# Patient Record
Sex: Female | Born: 1980 | Race: White | Hispanic: No | Marital: Single | Smoking: Former smoker
Health system: Southern US, Community
[De-identification: ages and names within clinical notes are randomized; demographics above are authoritative.]

## PROBLEM LIST (undated history)

## (undated) DIAGNOSIS — R112 Nausea with vomiting, unspecified: Secondary | ICD-10-CM

## (undated) DIAGNOSIS — N159 Renal tubulo-interstitial disease, unspecified: Secondary | ICD-10-CM

## (undated) DIAGNOSIS — F419 Anxiety disorder, unspecified: Secondary | ICD-10-CM

## (undated) DIAGNOSIS — M797 Fibromyalgia: Secondary | ICD-10-CM

## (undated) DIAGNOSIS — E162 Hypoglycemia, unspecified: Secondary | ICD-10-CM

## (undated) DIAGNOSIS — F1110 Opioid abuse, uncomplicated: Secondary | ICD-10-CM

## (undated) DIAGNOSIS — K56609 Unspecified intestinal obstruction, unspecified as to partial versus complete obstruction: Secondary | ICD-10-CM

## (undated) DIAGNOSIS — F4310 Post-traumatic stress disorder, unspecified: Secondary | ICD-10-CM

## (undated) DIAGNOSIS — K5909 Other constipation: Secondary | ICD-10-CM

## (undated) DIAGNOSIS — F32A Depression, unspecified: Secondary | ICD-10-CM

## (undated) DIAGNOSIS — F909 Attention-deficit hyperactivity disorder, unspecified type: Secondary | ICD-10-CM

## (undated) DIAGNOSIS — N390 Urinary tract infection, site not specified: Secondary | ICD-10-CM

## (undated) DIAGNOSIS — G8929 Other chronic pain: Secondary | ICD-10-CM

## (undated) DIAGNOSIS — F3160 Bipolar disorder, current episode mixed, unspecified: Secondary | ICD-10-CM

## (undated) DIAGNOSIS — Z9889 Other specified postprocedural states: Secondary | ICD-10-CM

## (undated) DIAGNOSIS — G43909 Migraine, unspecified, not intractable, without status migrainosus: Secondary | ICD-10-CM

## (undated) DIAGNOSIS — T4145XA Adverse effect of unspecified anesthetic, initial encounter: Secondary | ICD-10-CM

## (undated) DIAGNOSIS — M549 Dorsalgia, unspecified: Secondary | ICD-10-CM

## (undated) DIAGNOSIS — F329 Major depressive disorder, single episode, unspecified: Secondary | ICD-10-CM

## (undated) DIAGNOSIS — M546 Pain in thoracic spine: Secondary | ICD-10-CM

## (undated) DIAGNOSIS — B1920 Unspecified viral hepatitis C without hepatic coma: Secondary | ICD-10-CM

## (undated) DIAGNOSIS — F1990 Other psychoactive substance use, unspecified, uncomplicated: Secondary | ICD-10-CM

## (undated) DIAGNOSIS — M1990 Unspecified osteoarthritis, unspecified site: Secondary | ICD-10-CM

## (undated) HISTORY — PX: OVARY SURGERY: SHX727

## (undated) HISTORY — DX: Bipolar disorder, current episode mixed, unspecified: F31.60

## (undated) HISTORY — DX: Post-traumatic stress disorder, unspecified: F43.10

---

## 2003-07-06 ENCOUNTER — Emergency Department (HOSPITAL_COMMUNITY): Admission: EM | Admit: 2003-07-06 | Discharge: 2003-07-06 | Payer: Self-pay | Admitting: Emergency Medicine

## 2004-12-22 ENCOUNTER — Emergency Department (HOSPITAL_COMMUNITY): Admission: EM | Admit: 2004-12-22 | Discharge: 2004-12-23 | Payer: Self-pay | Admitting: Emergency Medicine

## 2005-08-23 DIAGNOSIS — K56609 Unspecified intestinal obstruction, unspecified as to partial versus complete obstruction: Secondary | ICD-10-CM

## 2005-08-23 DIAGNOSIS — T8859XA Other complications of anesthesia, initial encounter: Secondary | ICD-10-CM

## 2005-08-23 HISTORY — DX: Unspecified intestinal obstruction, unspecified as to partial versus complete obstruction: K56.609

## 2005-08-23 HISTORY — DX: Other complications of anesthesia, initial encounter: T88.59XA

## 2006-04-23 HISTORY — PX: BOWEL RESECTION: SHX1257

## 2011-12-28 ENCOUNTER — Observation Stay (HOSPITAL_COMMUNITY)
Admission: EM | Admit: 2011-12-28 | Discharge: 2012-01-01 | Disposition: A | Discharge status: Home / Self Care | Payer: Self-pay | Attending: Internal Medicine | Admitting: Internal Medicine

## 2011-12-28 ENCOUNTER — Observation Stay (HOSPITAL_COMMUNITY): Payer: Self-pay

## 2011-12-28 ENCOUNTER — Emergency Department (HOSPITAL_COMMUNITY): Payer: Self-pay

## 2011-12-28 ENCOUNTER — Encounter (HOSPITAL_COMMUNITY): Payer: Self-pay | Admitting: Emergency Medicine

## 2011-12-28 DIAGNOSIS — R1084 Generalized abdominal pain: Secondary | ICD-10-CM

## 2011-12-28 DIAGNOSIS — M545 Low back pain: Secondary | ICD-10-CM

## 2011-12-28 DIAGNOSIS — Z720 Tobacco use: Secondary | ICD-10-CM

## 2011-12-28 DIAGNOSIS — R109 Unspecified abdominal pain: Secondary | ICD-10-CM | POA: Diagnosis present

## 2011-12-28 DIAGNOSIS — R110 Nausea: Secondary | ICD-10-CM | POA: Insufficient documentation

## 2011-12-28 DIAGNOSIS — R10A1 Flank pain, right side: Secondary | ICD-10-CM

## 2011-12-28 DIAGNOSIS — F17200 Nicotine dependence, unspecified, uncomplicated: Secondary | ICD-10-CM | POA: Insufficient documentation

## 2011-12-28 DIAGNOSIS — J45909 Unspecified asthma, uncomplicated: Secondary | ICD-10-CM | POA: Diagnosis present

## 2011-12-28 DIAGNOSIS — R300 Dysuria: Secondary | ICD-10-CM | POA: Insufficient documentation

## 2011-12-28 DIAGNOSIS — K589 Irritable bowel syndrome without diarrhea: Secondary | ICD-10-CM

## 2011-12-28 DIAGNOSIS — M549 Dorsalgia, unspecified: Principal | ICD-10-CM

## 2011-12-28 DIAGNOSIS — R310 Gross hematuria: Secondary | ICD-10-CM | POA: Insufficient documentation

## 2011-12-28 DIAGNOSIS — G43909 Migraine, unspecified, not intractable, without status migrainosus: Secondary | ICD-10-CM

## 2011-12-28 DIAGNOSIS — Z23 Encounter for immunization: Secondary | ICD-10-CM | POA: Insufficient documentation

## 2011-12-28 DIAGNOSIS — K561 Intussusception: Secondary | ICD-10-CM

## 2011-12-28 DIAGNOSIS — R740 Nonspecific elevation of levels of transaminase and lactic acid dehydrogenase [LDH]: Secondary | ICD-10-CM

## 2011-12-28 DIAGNOSIS — R7402 Elevation of levels of lactic acid dehydrogenase (LDH): Secondary | ICD-10-CM | POA: Insufficient documentation

## 2011-12-28 DIAGNOSIS — R7401 Elevation of levels of liver transaminase levels: Secondary | ICD-10-CM

## 2011-12-28 HISTORY — DX: Pain in thoracic spine: M54.6

## 2011-12-28 HISTORY — DX: Anxiety disorder, unspecified: F41.9

## 2011-12-28 HISTORY — DX: Unspecified osteoarthritis, unspecified site: M19.90

## 2011-12-28 HISTORY — DX: Urinary tract infection, site not specified: N39.0

## 2011-12-28 HISTORY — DX: Hypoglycemia, unspecified: E16.2

## 2011-12-28 HISTORY — DX: Fibromyalgia: M79.7

## 2011-12-28 HISTORY — DX: Depression, unspecified: F32.A

## 2011-12-28 HISTORY — DX: Nausea with vomiting, unspecified: R11.2

## 2011-12-28 HISTORY — DX: Dorsalgia, unspecified: M54.9

## 2011-12-28 HISTORY — DX: Migraine, unspecified, not intractable, without status migrainosus: G43.909

## 2011-12-28 HISTORY — DX: Major depressive disorder, single episode, unspecified: F32.9

## 2011-12-28 HISTORY — DX: Renal tubulo-interstitial disease, unspecified: N15.9

## 2011-12-28 HISTORY — DX: Other chronic pain: G89.29

## 2011-12-28 HISTORY — DX: Attention-deficit hyperactivity disorder, unspecified type: F90.9

## 2011-12-28 HISTORY — DX: Other specified postprocedural states: Z98.890

## 2011-12-28 HISTORY — DX: Adverse effect of unspecified anesthetic, initial encounter: T41.45XA

## 2011-12-28 HISTORY — DX: Unspecified intestinal obstruction, unspecified as to partial versus complete obstruction: K56.609

## 2011-12-28 LAB — CBC
MCH: 31.5 pg (ref 26.0–34.0)
MCHC: 36.1 g/dL — ABNORMAL HIGH (ref 30.0–36.0)
Platelets: 281 10*3/uL (ref 150–400)
RBC: 4.89 MIL/uL (ref 3.87–5.11)
WBC: 10.7 10*3/uL — ABNORMAL HIGH (ref 4.0–10.5)

## 2011-12-28 LAB — RAPID URINE DRUG SCREEN, HOSP PERFORMED
Barbiturates: NOT DETECTED
Benzodiazepines: POSITIVE — AB
Cocaine: POSITIVE — AB
Opiates: NOT DETECTED
Tetrahydrocannabinol: POSITIVE — AB

## 2011-12-28 LAB — COMPREHENSIVE METABOLIC PANEL
Albumin: 4 g/dL (ref 3.5–5.2)
CO2: 17 mEq/L — ABNORMAL LOW (ref 19–32)
Chloride: 102 mEq/L (ref 96–112)
Creatinine, Ser: 0.77 mg/dL (ref 0.50–1.10)
GFR calc Af Amer: >90 mL/min (ref >90)
Total Protein: 7.3 g/dL (ref 6.0–8.3)

## 2011-12-28 LAB — URINALYSIS, ROUTINE W REFLEX MICROSCOPIC
Glucose, UA: NEGATIVE mg/dL
Ketones, ur: 15 mg/dL — AB
Protein, ur: NEGATIVE mg/dL
Urobilinogen, UA: 1 mg/dL (ref 0.0–1.0)
pH: 6 (ref 5.0–8.0)

## 2011-12-28 LAB — POCT PREGNANCY, URINE: Preg Test, Ur: NEGATIVE

## 2011-12-28 LAB — URINE MICROSCOPIC-ADD ON

## 2011-12-28 LAB — LACTIC ACID, PLASMA: Lactic Acid, Venous: 0.9 mmol/L (ref 0.5–2.2)

## 2011-12-28 LAB — LIPASE, BLOOD: Lipase: 38 U/L (ref 11–59)

## 2011-12-28 MED ORDER — HYDROMORPHONE HCL PF 1 MG/ML IJ SOLN
1.0000 mg | INTRAMUSCULAR | Status: DC | PRN
  Administered 2011-12-28 (×2): 1 mg via INTRAVENOUS
  Filled 2011-12-28 (×2): qty 1

## 2011-12-28 MED ORDER — SODIUM CHLORIDE 0.9 % IV BOLUS (SEPSIS)
1000.0000 mL | Freq: Once | INTRAVENOUS | Status: AC
  Administered 2011-12-28: 1000 mL via INTRAVENOUS

## 2011-12-28 MED ORDER — SODIUM CHLORIDE 0.9 % IV SOLN
INTRAVENOUS | Status: DC
  Administered 2011-12-28 – 2011-12-29 (×2): via INTRAVENOUS

## 2011-12-28 MED ORDER — ONDANSETRON HCL 4 MG PO TABS: 4.0000 mg | ORAL_TABLET | Freq: Four times a day (QID) | ORAL | Status: DC | PRN

## 2011-12-28 MED ORDER — ALBUTEROL SULFATE (5 MG/ML) 0.5% IN NEBU: 2.5000 mg | INHALATION_SOLUTION | RESPIRATORY_TRACT | Status: DC | PRN

## 2011-12-28 MED ORDER — HYDROMORPHONE HCL PF 1 MG/ML IJ SOLN
1.0000 mg | Freq: Once | INTRAMUSCULAR | Status: AC
  Administered 2011-12-28: 1 mg via INTRAVENOUS
  Filled 2011-12-28: qty 1

## 2011-12-28 MED ORDER — ONDANSETRON HCL 4 MG/2ML IJ SOLN: 4.0000 mg | Freq: Four times a day (QID) | INTRAMUSCULAR | Status: DC | PRN

## 2011-12-28 MED ORDER — ACETAMINOPHEN 325 MG PO TABS: 650.0000 mg | ORAL_TABLET | Freq: Four times a day (QID) | ORAL | Status: DC | PRN

## 2011-12-28 MED ORDER — LORAZEPAM 2 MG/ML IJ SOLN
0.5000 mg | Freq: Four times a day (QID) | INTRAMUSCULAR | Status: AC
Start: 2011-12-28 — End: 2011-12-29
  Administered 2011-12-28 – 2011-12-29 (×2): 0.5 mg via INTRAVENOUS
  Filled 2011-12-28 (×3): qty 1

## 2011-12-28 MED ORDER — ONDANSETRON HCL 4 MG/2ML IJ SOLN
4.0000 mg | Freq: Once | INTRAMUSCULAR | Status: AC
  Administered 2011-12-28: 4 mg via INTRAVENOUS
  Filled 2011-12-28 (×2): qty 2

## 2011-12-28 MED ORDER — OXYCODONE HCL 5 MG PO TABS
5.0000 mg | ORAL_TABLET | Freq: Four times a day (QID) | ORAL | Status: DC
  Administered 2011-12-28 – 2011-12-29 (×4): 5 mg via ORAL
  Filled 2011-12-28 (×5): qty 1

## 2011-12-28 MED ORDER — PNEUMOCOCCAL VAC POLYVALENT 25 MCG/0.5ML IJ INJ
0.5000 mL | INJECTION | INTRAMUSCULAR | Status: AC
  Administered 2011-12-29: 0.5 mL via INTRAMUSCULAR
  Filled 2011-12-28 (×2): qty 0.5

## 2011-12-28 MED ORDER — NICOTINE 14 MG/24HR TD PT24
14.0000 mg | MEDICATED_PATCH | Freq: Every day | TRANSDERMAL | Status: DC
  Administered 2011-12-28 – 2012-01-01 (×5): 14 mg via TRANSDERMAL
  Filled 2011-12-28 (×5): qty 1

## 2011-12-28 MED ORDER — ACETAMINOPHEN 650 MG RE SUPP: 650.0000 mg | Freq: Four times a day (QID) | RECTAL | Status: DC | PRN

## 2011-12-28 MED ORDER — HYDROMORPHONE HCL PF 1 MG/ML IJ SOLN
1.0000 mg | INTRAMUSCULAR | Status: DC | PRN
  Administered 2011-12-29 (×3): 1 mg via INTRAVENOUS
  Filled 2011-12-28 (×4): qty 1

## 2011-12-28 NOTE — ED Notes (Signed)
Nurse tech to take pt upstairs to [id number]

## 2011-12-28 NOTE — ED Notes (Signed)
Lower back pain x 2 days feels like knives when she voids hurts to move

## 2011-12-28 NOTE — ED Notes (Signed)
Provider notified that pt is awake and c/o pain again

## 2011-12-28 NOTE — ED Notes (Signed)
Attempted calling report, RN states she was busy, will try again w/in 15 min.

## 2011-12-28 NOTE — H&P (Signed)
PCP:   No primary provider on file.   Chief Complaint:  Right flank/RUQ abdominal pain, since 12/26/11.  HPI: This is a 31 year old female, with history of bronchial  Asthma, IBS, SBO 2007, s/p laparoscopy, laparoscopy 2001, for "ovarian problems", recently treated for UTI with Bactrim prescribed at [facility],  from 12/17/11-12/24/11, presenting with above symptoms. According to patient, following UTI treatment, she felt quite fine, went back to work on 12/26/11, and after work, at between 5:00 PM-6:00 PM, she developed severe right-sided back pain, constant, fluctuating in intensity, associated with fever and chills. She went back to [facility] on 12/27/11, had an abdominal/pelvic CT, which was reportedly negative, then discharged on Valium. She was unable to sleep all night, has continued to remain symptomatic and has vomited a few times since 4.55 AM today. By 5:00 AM, she decided to come to the ED.  Allergies:   Allergies  Allergen Reactions  . Levaquin (Levofloxacin)   . Morphine And Related   . Penicillins   . Toradol (Ketorolac Tromethamine)   . Ultram (Tramadol)       Past Medical History  Diagnosis Date  . Asthma     No past surgical history on file.  Prior to Admission medications   Not on File    Social History: Patient works at [employer], is single, has no offspring. She smokes about a pack of cigarettes per day, since age 42 years, used to drink heavily, up to 4 years ago, but now has only 2 beers on weekends. She does not have any smokeless tobacco history on file. Denies drug abuse.  No family history on file.  Review of Systems:  As per HPI and chief complaint. Patent denies fatigue, diminished appetite, weight loss, headache, blurred vision, difficulty in speaking, dysphagia, chest pain, cough, shortness of breath, orthopnea, paroxysmal nocturnal dyspnea, nausea, diaphoresis, belching, heartburn, hematemesis, melena, dysuria, nocturia, urinary  frequency, hematochezia, lower extremity swelling, pain, or redness. She has irritable bowel syndrome, and moves her bowels, about 5-6 times daily. The rest of the systems review is negative.  Physical Exam:  General: Alert, communicative, fully oriented, talking in complete sentences, not short of breath at rest. Appears to be in some pain on movement.  HEENT:  No clinical pallor, no jaundice, no conjunctival injection or discharge. Visible buccal mucosa appears "dry" NECK:  Supple, JVP not seen, no carotid bruits, no palpable lymphadenopathy, no palpable goiter. CHEST:  Clinically clear to auscultation, no wheezes, no crackles. HEART:  Sounds 1 and 2 heard, normal, regular, no murmurs. ABDOMEN:  Flat, soft, old surgical scars are noted, has mild RUQ tenderness, no palpable organomegaly, no palpable masses, normal bowel sounds. GENITALIA:  Not examined. LOWER EXTREMITIES:  No pitting edema, palpable peripheral pulses. MUSCULOSKELETAL SYSTEM:  Has tenderness to palpation of lumbar spine, with right paraspinal muscle spasm.  CENTRAL NERVOUS SYSTEM:  No focal neurologic deficit on gross examination.  Labs on Admission:  Results for orders placed during the hospital encounter of 12/28/11 (from the past 48 hour(s))  URINALYSIS, ROUTINE W REFLEX MICROSCOPIC     Status: Abnormal   Collection Time   12/28/11  7:38 AM      Component Value Range Comment   Color, Urine AMBER (*) YELLOW  BIOCHEMICALS MAY BE AFFECTED BY COLOR   APPearance TURBID (*) CLEAR     Specific Gravity, Urine 1.023  1.005 - 1.030     pH 6.0  5.0 - 8.0     Glucose, UA NEGATIVE  NEGATIVE (mg/dL)    Hgb urine dipstick LARGE (*) NEGATIVE     Bilirubin Urine NEGATIVE  NEGATIVE     Ketones, ur 15 (*) NEGATIVE (mg/dL)    Protein, ur NEGATIVE  NEGATIVE (mg/dL)    Urobilinogen, UA 1.0  0.0 - 1.0 (mg/dL)    Nitrite NEGATIVE  NEGATIVE     Leukocytes, UA SMALL (*) NEGATIVE    URINE MICROSCOPIC-ADD ON     Status: Abnormal   Collection  Time   12/28/11  7:38 AM      Component Value Range Comment   Squamous Epithelial / LPF MANY (*) RARE     WBC, UA 7-10  <3 (WBC/hpf)    RBC / HPF 11-20  <3 (RBC/hpf)    Bacteria, UA FEW (*) RARE     Urine-Other MUCOUS PRESENT     POCT PREGNANCY, URINE     Status: Normal   Collection Time   12/28/11  7:46 AM      Component Value Range Comment   Preg Test, Ur NEGATIVE  NEGATIVE    CBC     Status: Abnormal   Collection Time   12/28/11  7:56 AM      Component Value Range Comment   WBC 10.7 (*) 4.0 - 10.5 (K/uL)    RBC 4.89  3.87 - 5.11 (MIL/uL)    Hemoglobin 15.4 (*) 12.0 - 15.0 (g/dL)    HCT 29.5  62.1 - 30.8 (%)    MCV 87.3  78.0 - 100.0 (fL)    MCH 31.5  26.0 - 34.0 (pg)    MCHC 36.1 (*) 30.0 - 36.0 (g/dL)    RDW 65.7  84.6 - 96.2 (%)    Platelets 281  150 - 400 (K/uL) PLATELET COUNT CONFIRMED BY SMEAR  COMPREHENSIVE METABOLIC PANEL     Status: Abnormal   Collection Time   12/28/11  7:56 AM      Component Value Range Comment   Sodium 133 (*) 135 - 145 (mEq/L)    Potassium 3.6  3.5 - 5.1 (mEq/L)    Chloride 102  96 - 112 (mEq/L)    CO2 17 (*) 19 - 32 (mEq/L)    Glucose, Bld 78  70 - 99 (mg/dL)    BUN 11  6 - 23 (mg/dL)    Creatinine, Ser 9.52  0.50 - 1.10 (mg/dL)    Calcium 9.5  8.4 - 10.5 (mg/dL)    Total Protein 7.3  6.0 - 8.3 (g/dL)    Albumin 4.0  3.5 - 5.2 (g/dL)    AST 841 (*) 0 - 37 (U/L)    ALT 222 (*) 0 - 35 (U/L)    Alkaline Phosphatase 77  39 - 117 (U/L)    Total Bilirubin 0.8  0.3 - 1.2 (mg/dL)    GFR calc non Af Amer >90  >90 (mL/min)    GFR calc Af Amer >90  >90 (mL/min)   LIPASE, BLOOD     Status: Normal   Collection Time   12/28/11  8:04 AM      Component Value Range Comment   Lipase 38  11 - 59 (U/L)   LACTIC ACID, PLASMA     Status: Normal   Collection Time   12/28/11 11:30 AM      Component Value Range Comment   Lactic Acid, Venous 0.7  0.5 - 2.2 (mmol/L)     Radiological Exams on Admission: *RADIOLOGY REPORT*  Clinical Data: Abdominal pain question  biliary colic,  transaminitis,  question cholecystitis  ULTRASOUND ABDOMEN:  Technique: Sonography of upper abdominal structures was performed.  Comparison: 02/02/2007  Correlation: CT abdomen 12/27/2011  Gallbladder: Normally distended without stones or wall thickening.  No pericholecystic fluid or sonographic Murphy sign.  Common bile duct: Normal caliber 5 mm diameter  Liver: Normal appearance  IVC: Normal appearance  Pancreas: Normal appearance  Spleen: Normal appearance, 7.9 cm length  Right kidney: 11.2 cm length. Normal morphology without mass or  hydronephrosis.  Left kidney: 11.0 cm length. Normal morphology without mass or  hydronephrosis.  Aorta: Normal caliber  Other: No free fluid  IMPRESSION:  Normal exam.  Original Report Authenticated By: Lollie Marrow, M.D.   Assessment/Plan Principal Problem:  *Acute back pain: Patient has clearly discernible right flank/back pain, in a radicular distribution, associated with right paraspinal muscle spasm, and lumber tenderness, exacerbated by movement. She has no history of antecedent trauma. Given her presentation with recent UTI, chills and fever, an epidural abscess/diskitis is in the differential, or simply a disk prolapse with radiculopathy. Urinalysis is negative, effectively ruling out a right pyelonephritis, and abdominal U/S is negative for calculi or hydronephrosis. We shall manage with opioid analgesics, and arrange thoraco-lumbar MRI. If patient spikes a pyrexia, we shall commence broad spectrum antibiotics. Meanwhile, we shall send off blood cultures. Active Problems:  1. Abdominal pain: Patient has mild RUQ abdominal pain, which I suspect is referred from back pain. Although she has a mild transaminitis, this is insufficient to account for pain of this severity.  2. Transaminitis: Etiology is unclear. Patient's alcohol intake is only mild, and she has no previous documented transaminitis. We shall do urine drug screen,  Acetaminophen levels, and viral hepatitis screen. Liver appearance is normal on ultrasound.  3. Asthma: Stable/Asymptomatic.  4. IBS (irritable bowel syndrome): Appears stable. In view of recent antibiotic treatment, will screen for C. Diff.   5. Tobacco abuse: Patient smokes about a pack of cigarettes per day. She has been counseled appropriately. We shall commence Nicoderm CQ patch.  Further management will depend on clinical course.  Time Spent on Admission: 45 mins.  Kvion Shapley,CHRISTOPHER 12/28/2011, 2:28 PM

## 2011-12-28 NOTE — ED Notes (Signed)
Pt asleep and resting comfortably. 

## 2011-12-28 NOTE — ED Provider Notes (Signed)
History     CSN: [id number]  Arrival date & time 12/28/11  4098   First MD Initiated Contact with Patient 12/28/11 [phone]      Chief Complaint  Patient presents with  . Back Pain    (Consider location/radiation/quality/duration/timing/severity/associated sxs/prior treatment) Patient is a 31 y.o. female presenting with back pain. The history is provided by the patient and a relative.  Back Pain  This is a new problem.   right flank pain for the last several days. No known back injury. Pain described as burning and sharp, radiating to the right side/abd, severe. Assoc with chills, nausea, dysuria, hematuria (though pt is on menses now). No known fever. Denies CP, SOB, change in bowel habits, trouble ambulating, incontinence. Movement and palpation of the affected area make her pain worse. Nothing makes it better. Seen for same at [facility] ER last night, with normal labs and CT scan per pt. Tx approx 1 week ago by [facility] for UTI with bactrim and percocet.      Past Medical History  Diagnosis Date  . Asthma     Surgical hx: procedure for SBO, "fluid drainage from around ovaries"  No family history on file.  History  Substance Use Topics  . Smoking status: Yes  . Smokeless tobacco:   . Alcohol Use: Socially    Review of Systems  Musculoskeletal: Positive for back pain.  10 systems reviewed and are otherwise negative for acute change except as noted in the HPI.   Allergies  Levaquin; Morphine and related; Penicillins; Toradol; and Ultram  Home Medications  No current outpatient prescriptions on file.  BP 122/81  Pulse 82  Temp(Src) 97.9 F (36.6 C) (Oral)  Resp 22  SpO2 97%  Physical Exam  Constitutional: She is oriented to person, place, and time. She appears well-developed and well-nourished. She appears distressed.       Vital signs are reviewed and are normal.   HENT:  Head: Normocephalic and atraumatic.  Mouth/Throat: Oropharynx is clear and moist.    Eyes: Conjunctivae are normal. Pupils are equal, round, and reactive to light. No scleral icterus.  Neck: Normal range of motion. Neck supple.  Cardiovascular: Normal rate, regular rhythm, normal heart sounds and intact distal pulses.   Pulmonary/Chest: Effort normal and breath sounds normal. No respiratory distress. She has no wheezes. She exhibits no tenderness.  Abdominal: Soft. Bowel sounds are normal. She exhibits no distension. There is generalized tenderness (worst to entire right side of abd). There is CVA tenderness (right). There is no rebound and no guarding.  Musculoskeletal: Normal range of motion. She exhibits tenderness. She exhibits no edema.       Back:  Neurological: She is alert and oriented to person, place, and time.  Skin: Skin is warm and dry. There is erythema.       ED Course  Procedures (including critical care time)  Labs Reviewed  URINALYSIS, ROUTINE W REFLEX MICROSCOPIC - Abnormal; Notable for the following:    Color, Urine AMBER (*) BIOCHEMICALS MAY BE AFFECTED BY COLOR   APPearance TURBID (*)    Hgb urine dipstick LARGE (*)    Ketones, ur 15 (*)    Leukocytes, UA SMALL (*)    All other components within normal limits  CBC - Abnormal; Notable for the following:    Hemoglobin 15.4 (*)    MCHC 36.1 (*)    All other components within normal limits  URINE MICROSCOPIC-ADD ON - Abnormal; Notable for the following:  Squamous Epithelial / LPF MANY (*)    Bacteria, UA FEW (*)    All other components within normal limits  POCT PREGNANCY, URINE  COMPREHENSIVE METABOLIC PANEL   US Abdomen Complete  12/28/2011  *RADIOLOGY REPORT*  Clinical Data:  Abdominal pain question biliary colic, transaminitis, question cholecystitis  ULTRASOUND ABDOMEN:  Technique:  Sonography of upper abdominal structures was performed.  Comparison:  02/02/2007 Correlation:  CT abdomen 12/27/2011  Gallbladder:  Normally distended without stones or wall thickening. No pericholecystic  fluid or sonographic Murphy sign.  Common bile duct:  Normal caliber 5 mm diameter  Liver:  Normal appearance  IVC:  Normal appearance  Pancreas:  Normal appearance  Spleen:  Normal appearance, 7.9 cm length  Right kidney:  11.2 cm length. Normal morphology without mass or hydronephrosis.  Left kidney:  11.0 cm length. Normal morphology without mass or hydronephrosis.  Aorta:  Normal caliber  Other:  No free fluid  IMPRESSION: Normal exam.  Original Report Authenticated By: Lollie Marrow, M.D.     1. Right flank pain   2. Transaminitis       MDM  8:00 AM- On initial examination, pt writing around in bed, hyperventilating. Rash seen to right side that she reports was absent prior to placement of a heating pad on the way to ED. Not c/w shingles in appearance. No vesicles seen. Given recent UTI, suspicion present for pyelonephritis. Basic labs, pain med, nausea med ordered. Have requested records from [place] ED from yesterday's visit.    8:55 AM Transaminitis. Anion Gap 14. Pt denies hx hepatic dz. Lipase is added to labs. Awaiting results from [place], but will likely need to order abd [place] to eval for biliary pathology.    10:10 AM Results from [place] reviewed, CT scan results reviewed. Only pertinent finding is subcentimeter hypodensity in left lobe of liver, suspected to be benign. Labs sig for transaminitis, lower than that seen on today's labs. Lipase WNL today. [place] abd ordered. Pt has required additional pain medication dosing.   11:45 AM Abd [place] normal. No explanation for pt pain/transaminitis has been found. She denies excessive acetaminophen or ASA use. Denies heavy drinking. No hx DM. Will page unassigned service for admission to hospital to further eval the cause of her symptoms.     12:30 PM Dr Brien Few, [facility], has agreed to see pt in ED for admission.   Shaaron Adler, PA-C 12/28/11 1259

## 2011-12-28 NOTE — ED Notes (Signed)
Attempted calling report again, was told RN was at lunch and Charge nurse would call back w/in 5 min to get report

## 2011-12-28 NOTE — Progress Notes (Signed)
12/28/2011 Amy Walls [facility] Case Management Note [phone]  Utilization review completed.  

## 2011-12-29 ENCOUNTER — Observation Stay (HOSPITAL_COMMUNITY): Payer: Self-pay

## 2011-12-29 DIAGNOSIS — R1084 Generalized abdominal pain: Secondary | ICD-10-CM

## 2011-12-29 DIAGNOSIS — F17200 Nicotine dependence, unspecified, uncomplicated: Secondary | ICD-10-CM

## 2011-12-29 DIAGNOSIS — M545 Low back pain, unspecified: Secondary | ICD-10-CM

## 2011-12-29 DIAGNOSIS — R7401 Elevation of levels of liver transaminase levels: Secondary | ICD-10-CM

## 2011-12-29 DIAGNOSIS — R740 Nonspecific elevation of levels of transaminase and lactic acid dehydrogenase [LDH]: Secondary | ICD-10-CM

## 2011-12-29 LAB — CBC
HCT: 36.9 % (ref 36.0–46.0)
Hemoglobin: 12.8 g/dL (ref 12.0–15.0)
MCH: 30.7 pg (ref 26.0–34.0)
MCHC: 34.7 g/dL (ref 30.0–36.0)
RBC: 4.17 MIL/uL (ref 3.87–5.11)

## 2011-12-29 LAB — COMPREHENSIVE METABOLIC PANEL
ALT: 287 U/L — ABNORMAL HIGH (ref 0–35)
AST: 332 U/L — ABNORMAL HIGH (ref 0–37)
Calcium: 8.7 mg/dL (ref 8.4–10.5)
Creatinine, Ser: 0.74 mg/dL (ref 0.50–1.10)
GFR calc Af Amer: >90 mL/min (ref >90)
GFR calc non Af Amer: >90 mL/min (ref >90)
Sodium: 138 mEq/L (ref 135–145)
Total Protein: 5.8 g/dL — ABNORMAL LOW (ref 6.0–8.3)

## 2011-12-29 LAB — HEPATITIS PANEL, ACUTE: Hepatitis B Surface Ag: NEGATIVE

## 2011-12-29 MED ORDER — METHOCARBAMOL 500 MG PO TABS
500.0000 mg | ORAL_TABLET | Freq: Four times a day (QID) | ORAL | Status: DC
  Administered 2011-12-29 – 2012-01-01 (×13): 500 mg via ORAL
  Filled 2011-12-29 (×18): qty 1

## 2011-12-29 MED ORDER — HYDROMORPHONE HCL PF 1 MG/ML IJ SOLN
1.0000 mg | Freq: Four times a day (QID) | INTRAMUSCULAR | Status: DC | PRN
  Administered 2011-12-29 – 2012-01-01 (×12): 1 mg via INTRAVENOUS
  Filled 2011-12-29 (×11): qty 1

## 2011-12-29 MED ORDER — OXYCODONE HCL 5 MG PO TABS
10.0000 mg | ORAL_TABLET | Freq: Four times a day (QID) | ORAL | Status: DC
  Administered 2011-12-29 – 2012-01-01 (×12): 10 mg via ORAL
  Filled 2011-12-29: qty 1
  Filled 2011-12-29 (×3): qty 2
  Filled 2011-12-29: qty 1
  Filled 2011-12-29 (×8): qty 2

## 2011-12-29 MED ORDER — NITROFURANTOIN MONOHYD MACRO 100 MG PO CAPS
100.0000 mg | ORAL_CAPSULE | Freq: Two times a day (BID) | ORAL | Status: AC
  Administered 2011-12-29 – 2011-12-31 (×6): 100 mg via ORAL
  Filled 2011-12-29 (×6): qty 1

## 2011-12-29 MED ORDER — ONDANSETRON HCL 4 MG/2ML IJ SOLN: 4.0000 mg | Freq: Three times a day (TID) | INTRAMUSCULAR | Status: DC | PRN

## 2011-12-29 MED ORDER — GADOBENATE DIMEGLUMINE 529 MG/ML IV SOLN
11.0000 mL | Freq: Once | INTRAVENOUS | Status: AC | PRN
  Administered 2011-12-29: 11 mL via INTRAVENOUS

## 2011-12-29 MED ORDER — HYDROMORPHONE HCL PF 1 MG/ML IJ SOLN: 1.0000 mg | INTRAMUSCULAR | Status: DC | PRN

## 2011-12-29 MED ORDER — SODIUM CHLORIDE 0.9 % IV SOLN: INTRAVENOUS | Status: DC
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
## 2011-12-29 NOTE — ED Provider Notes (Signed)
Medical screening examination/treatment/procedure(s) were performed by non-physician practitioner and as supervising physician I was immediately available for consultation/collaboration.    Nelia Shi, MD 12/29/11 [phone]

## 2011-12-29 NOTE — Progress Notes (Signed)
Patient displaying severe amount of ride sided flank pain this shift, jumping out of the bed and screaming in pain, even with Oxycodone IR and Dilaudid administered earlier.  Questioned patient regarding current or past drug abuse of any kind.  Patient denies abuse of any kind.  Patient states that earlier in the year she had brief suicidal ideations when she lost her job and her dog died, but when questioned about current suicidal ideations, patient states "Everything is fine now, life is good."  Patient has heating pad brought in from home stating that "it is the only thing that helps my pain," which she has been told multiple times that there needs to be a MD order for such use.  Skin appeared to be red and slightly irritated from heating pad application to direct skin, offered to wrap pad in towel, patient refused this measure, continues to use heating pad against instruction.

## 2011-12-30 ENCOUNTER — Encounter (HOSPITAL_COMMUNITY): Payer: Self-pay | Admitting: Radiology

## 2011-12-30 ENCOUNTER — Observation Stay (HOSPITAL_COMMUNITY): Payer: Self-pay

## 2011-12-30 DIAGNOSIS — M545 Low back pain: Secondary | ICD-10-CM

## 2011-12-30 DIAGNOSIS — R109 Unspecified abdominal pain: Secondary | ICD-10-CM

## 2011-12-30 DIAGNOSIS — R1084 Generalized abdominal pain: Secondary | ICD-10-CM

## 2011-12-30 DIAGNOSIS — F17200 Nicotine dependence, unspecified, uncomplicated: Secondary | ICD-10-CM

## 2011-12-30 DIAGNOSIS — R740 Nonspecific elevation of levels of transaminase and lactic acid dehydrogenase [LDH]: Secondary | ICD-10-CM

## 2011-12-30 LAB — COMPREHENSIVE METABOLIC PANEL
ALT: 560 U/L — ABNORMAL HIGH (ref 0–35)
Alkaline Phosphatase: 76 U/L (ref 39–117)
BUN: 6 mg/dL (ref 6–23)
CO2: 23 mEq/L (ref 19–32)
GFR calc Af Amer: >90 mL/min (ref >90)
GFR calc non Af Amer: >90 mL/min (ref >90)
Glucose, Bld: 93 mg/dL (ref 70–99)
Potassium: 3.8 mEq/L (ref 3.5–5.1)
Sodium: 138 mEq/L (ref 135–145)
Total Protein: 6.6 g/dL (ref 6.0–8.3)

## 2011-12-30 LAB — ANA: Anti Nuclear Antibody(ANA): NEGATIVE

## 2011-12-30 MED ORDER — IOHEXOL 300 MG/ML  SOLN
80.0000 mL | Freq: Once | INTRAMUSCULAR | Status: AC | PRN
  Administered 2011-12-30: 80 mL via INTRAVENOUS

## 2011-12-30 MED ORDER — IOHEXOL 300 MG/ML  SOLN
20.0000 mL | INTRAMUSCULAR | Status: AC
  Administered 2011-12-30 (×2): 20 mL via ORAL

## 2011-12-30 NOTE — Progress Notes (Signed)
Pt admitted, as per rept, for acute back pain and recent UTI. Pt is being treated for UTI. Pt repts continued low pelvic pain ? Related to "bladder infection". Pt also repts that she has R flank pain that radiates into her R side that is unchanged since admission. Pt rept that her urine remains amber to bld tinged at times. Pt repts that she voids quantity sufficient without difficulty. Pt repts LBM 5/7. Pt repts that she has intermittent nausea with pain and interm SOB with pain that is unchanged since admission. Pt tolerates diet fair to good. Pt repts passing gas. Pt lungs CTA.  Heart rate regular rate and rhythm. No s/sx resp or cardiac distress and no c/o such. Vital signs stable. Pt has a nicoderm patch to R shoulder that we will remove and replace this AM per order.
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
## 2011-12-30 NOTE — Consult Note (Signed)
Patient seen, examined, and I agree with the above documentation, including the assessment and plan. No hx of liver disease or elevated LFTs to our knowledge. ANA is normal which is reassuring against AIH. Could be reactive or related to PID/infection. Agree with CT Await results.

## 2011-12-30 NOTE — Consult Note (Signed)
Patient seen, examined, and I agree with the above documentation, including the assessment and plan. AST/ALT approx 10 x ULN, unclear etiology. Viral studies neg. ANA neg Agree this could be reactive Agree with CT and excluding PID

## 2011-12-30 NOTE — Progress Notes (Signed)
NP called by Dr. Molli Posey in radiology. Pt is s/p CT of pelvis. Dr. Molli Posey says there is really nothing on the scan to explain the pt's admitting sx, but there is evidence of introsusception without obstruction. We will manage conservatively for now. If sx of obstruction occur, will rescan pt. This NP called the RN on [phone] to relate above and asked him to call me for any sx of obstruction which were reviewed with the RN.  Maren Reamer, NP [facility]

## 2011-12-30 NOTE — Progress Notes (Signed)
Physical Therapy Evaluation Note  Past Medical History  Diagnosis Date  . Asthma   . Complication of anesthesia 2007    "lost me on the table when I had upper bowel obstruction"  . PONV (postoperative nausea and vomiting)   . Chronic bronchitis     "yearly"  . Hypoglycemia     "my sugar runs low alot"  . Migraines 12/28/11    "@ least once/wk"  . Recurrent UTI   . Kidney infection     "often"  . Bowel obstruction 2007    "upper bowel"  . Arthritis     "inflammation of all my joints"  . Chronic mid back pain   . Fibromyalgia   . Anxiety   . Depression   . ADHD (attention deficit hyperactivity disorder)     Past Surgical History  Procedure Date  . Bowel resection 04/2006    "upper bowel"  . Ovary surgery ~ 2002    "had 10 inches blood built up; they opened me up and got the blood out"     12/30/11 0853  PT Visit Information  Last PT Received On 12/30/11  Assistance Needed +1  PT Time Calculation  PT Start Time 0853  PT Stop Time 0923  PT Time Calculation (min) 30 min  Subjective Data  Subjective Pt received supine in bed with c/o 8/10 back pain.  Precautions  Precautions None  Restrictions  Weight Bearing Restrictions No  Home Living  Lives With (31 yo female roomate who is unable to assist pt)  Available Help at Discharge (none)  Type of Home (trailer)  Home Access Ramped entrance  Home Layout One level  Bathroom Shower/Tub Walk-in shower;Door  Horticulturist, commercial Yes  How Accessible Accessible via walker  Home Adaptive Equipment None  Prior Function  Level of Independence Independent  Able to Take Stairs? Yes  Driving Yes  Vocation Full time employment  Comments works at [employer] No difficulties  Cognition  Overall Cognitive Status Appears within functional limits for tasks assessed/performed  Arousal/Alertness Lethargic  Orientation Level Oriented X4 / Intact  Behavior During Session  [facility] for tasks performed  Right Upper Extremity Assessment  RUE ROM/Strength/Tone WFL  Left Upper Extremity Assessment  LUE ROM/Strength/Tone WFL  Right Lower Extremity Assessment  RLE ROM/Strength/Tone Due to pain;Deficits  RLE ROM/Strength/Tone Deficits grossly 3/5  Left Lower Extremity Assessment  LLE ROM/Strength/Tone Deficits;Due to pain  LLE ROM/Strength/Tone Deficits grossly 3/5  Trunk Assessment  Trunk Assessment (increased trunk flexion)  Bed Mobility  Bed Mobility Rolling Left;Left Sidelying to Sit  Rolling Left 4: Min guard;With rail  Left Sidelying to Sit 4: Min assist;With rails;HOB flat  Details for Bed Mobility Assistance v/c's for technique, pt with increased pain with all movement  Transfers  Transfers Sit to Stand;Stand to Sit  Sit to Stand 4: Min assist;With upper extremity assist;From bed  Stand to Sit 4: Min guard;With upper extremity assist;With armrests;To chair/3-in-1  Details for Transfer Assistance pt very guarded with increased trunk flexion for all transfers, increased time required  Ambulation/Gait  Ambulation/Gait Assistance 4: Min assist  Ambulation Distance (Feet) 20 Feet  Assistive device 1 person hand held assist  Ambulation/Gait Assistance Details pt unsteady, increased trunk flexion, pt with report of 10/10 low R back pain  Gait Pattern Step-to pattern;Decreased stride length;Antalgic;Trunk flexed  Gait velocity slow  Stairs No  Balance  Balance Assessed Yes  Static Standing Balance  Static Standing - Balance Support  Left upper extremity supported  Static Standing - Level of Assistance 5: Stand by assistance  Static Standing - Comment/# of Minutes pt stood x 2 min to wash hand and receive pain medication from RN.  PT - End of Session  Equipment Utilized During Treatment Gait belt  Activity Tolerance Patient limited by pain  Patient left in chair;with call bell/phone within reach  Nurse Communication Mobility status;Patient requests pain meds   PT Assessment  Clinical Impression Statement Pt admitted with extreme back pain. patient presents with increased fall risk and instabilty with ambulation and transfers. pt reports of 10/10 back pain however remains to be lethargic from pain medication. Patient with report of no assist at home and has to work 7 days a week at [employer]. Patient with possible musculoskeletal mal-alignments at pelvis/low back and would benefit from out patient PT. Pt with limited ability to participate in acute PT until pain under control.  PT Recommendation/Assessment Patient needs continued PT services  PT Problem List Decreased strength;Decreased activity tolerance;Decreased balance;Decreased mobility  Barriers to Discharge Decreased caregiver support  PT Therapy Diagnosis  Difficulty walking;Abnormality of gait;Generalized weakness;Acute pain  PT Plan  PT Frequency Min 2X/week  PT Treatment/Interventions Gait training;Stair training;Functional mobility training;Therapeutic activities;Therapeutic exercise;DME instruction;Balance training  PT Recommendation  Follow Up Recommendations Outpatient PT  Equipment Recommended None recommended by PT  Individuals Consulted  Consulted and Agree with Results and Recommendations Patient  Acute Rehab PT Goals  PT Goal Formulation With patient  Time For Goal Achievement 01/06/12  Potential to Achieve Goals Good  Pt will Roll Supine to Left Side with modified independence  PT Goal: Rolling Supine to Left Side - Progress Goal set today  Pt will go Supine/Side to Sit with modified independence;with HOB 0 degrees  PT Goal: Supine/Side to Sit - Progress Goal set today  Pt will go Sit to Supine/Side Independently;with HOB 0 degrees  PT Goal: Sit to Supine/Side - Progress Goal set today  Pt will go Sit to Stand Independently  PT Goal: Sit to Stand - Progress Goal set today  Pt will Ambulate >150 feet;Independently;with least restrictive assistive device  PT Goal: Ambulate  - Progress Goal set today  Written Expression  Dominant Hand Right     Pain: 10/10 low back pain, concentrated on R side. RN aware and provided pain medication.  Lewis Shock, PT, DPT Pager #: [phone] Office #: [phone]

## 2011-12-30 NOTE — Consult Note (Addendum)
Referring Provider: Dr. Brien Few  Primary Care Physician:  No primary provider on file. Primary Gastroenterologist:  none  Reason for Consultation: elevated LFT's, and right abdominal/flank pain  HPI: Amy Walls is a 31 y.o. female  with history of asthma and IBS. She also has history of recurrent urinary tract infections She is status post exploratory lap or what she describes as a small bowel obstruction in 2007. She does not think that she had any bowel resected. This was done in 2007 at [facility]. She has also had prior laparoscopy for an ovarian issue several years ago.  Patient is currently admitted here on 12/28/2011 with complaints of right flank pain fever chills the nausea and vomiting. Apparently initiate onto the [facility] initially had a CT scan done which was negative by report though we  not have a copy of that and she was discharged home . She started vomiting and then came to the emergency room at home. In the background of this she had had a recent bladder infection for which he was treated with Bactrim and pyridium on 4/26 through 12/24/2011 and also simultaneously was being treated for a bacterial vaginosis with Flagyl and Diflucan. She says she did not have any pelvic pain or fever with her vaginal infection but did have discharge. She feels that those symptoms completely resolved. She does not  not feel that her urinary symptoms completely resolved. She says when she finished the antibiotics she was still having lower of abdominal discomfort and some dysuria which then progressed. On 5/5 she developed the sharp right flank pain which she describes as a "ball of fire". She had fever at home to 101, has not had fever here since. She describes no diarrhea melena or hematochezia. She is still having dysuria.  She is noted to have a transaminitis which is is persisting. She had upper abdominal ultrasound on 5/7 which was normal -no gallstones, no gallbladder wall thickening, liver  appeared normal, common bile duct 5 mm. She has had hepatitis A, B, and C acute serologies done which were negative acetaminophen level negative, she did have a positive drug screen for THC  and cocaine. On further questioning she says she does have a prior history of heavy alcohol use but had not has not been drinking regularly over the past couple of years. She specifically denies any daily EtOH use. She denies any other supplements over-the-counter products et Karie Soda.   Past Medical History  Diagnosis Date  . Asthma   . Complication of anesthesia 2007    "lost me on the table when I had upper bowel obstruction"  . PONV (postoperative nausea and vomiting)   . Chronic bronchitis     "yearly"  . Hypoglycemia     "my sugar runs low alot"  . Migraines 12/28/11    "@ least once/wk"  . Recurrent UTI   . Kidney infection     "often"  . Bowel obstruction 2007    "upper bowel"  . Arthritis     "inflammation of all my joints"  . Chronic mid back pain   . Fibromyalgia   . Anxiety   . Depression   . ADHD (attention deficit hyperactivity disorder)     Past Surgical History  Procedure Date  . Bowel resection 04/2006    "upper bowel"  . Ovary surgery ~ 2002    "had 10 inches blood built up; they opened me up and got the blood out"    Prior to Admission medications  Not on File    Current Facility-Administered Medications  Medication Dose Route Frequency Provider Last Rate Last Dose  . acetaminophen (TYLENOL) tablet 650 mg  650 mg Oral Q6H PRN Laveda Norman, MD       Or  . acetaminophen (TYLENOL) suppository 650 mg  650 mg Rectal Q6H PRN Laveda Norman, MD      . albuterol (PROVENTIL) (5 MG/ML) 0.5% nebulizer solution 2.5 mg  2.5 mg Nebulization Q2H PRN Laveda Norman, MD      . HYDROmorphone (DILAUDID) injection 1 mg  1 mg Intravenous Q6H PRN Laveda Norman, MD   1 mg at 12/30/11 0903  . methocarbamol (ROBAXIN) tablet 500 mg  500 mg Oral QID Laveda Norman, MD   500 mg at 12/30/11 0951  .  nicotine (NICODERM CQ - dosed in mg/24 hours) patch 14 mg  14 mg Transdermal Daily Laveda Norman, MD   14 mg at 12/30/11 0951  . nitrofurantoin (macrocrystal-monohydrate) (MACROBID) capsule 100 mg  100 mg Oral Q12H Laveda Norman, MD   100 mg at 12/30/11 0951  . ondansetron (ZOFRAN) tablet 4 mg  4 mg Oral Q6H PRN Laveda Norman, MD       Or  . ondansetron [facility]) injection 4 mg  4 mg Intravenous Q6H PRN Laveda Norman, MD      . oxyCODONE (Oxy IR/ROXICODONE) immediate release tablet 10 mg  10 mg Oral QID Laveda Norman, MD   10 mg at 12/30/11 0951  . pneumococcal 23 valent vaccine (PNU-IMMUNE) injection 0.5 mL  0.5 mL Intramuscular Tomorrow-1000 Laveda Norman, MD   0.5 mL at 12/29/11 1509  . DISCONTD: 0.9 %  sodium chloride infusion   Intravenous Continuous Laveda Norman, MD 100 mL/hr at 12/29/11 0820    . DISCONTD: HYDROmorphone (DILAUDID) injection 1 mg  1 mg Intravenous Q3H PRN Caroline More, NP   1 mg at 12/29/11 1104  . DISCONTD: oxyCODONE (Oxy IR/ROXICODONE) immediate release tablet 5 mg  5 mg Oral QID Laveda Norman, MD   5 mg at 12/29/11 1123    Allergies as of 12/28/2011 - Review Complete 12/28/2011  Allergen Reaction Noted  . Levaquin (levofloxacin) Other (See Comments) 12/28/2011  . Morphine and related Other (See Comments) 12/28/2011  . Penicillins Anaphylaxis 12/28/2011  . Toradol (ketorolac tromethamine) Other (See Comments) 12/28/2011  . Ultram (tramadol) Other (See Comments) 12/28/2011    History reviewed. No pertinent family history.  History   Social History  . Marital Status: Single    Spouse Name: N/A    Number of Children: N/A  . Years of Education: N/A   Occupational History  . Not on file.   Social History Main Topics  . Smoking status: Current Everyday Smoker -- 1.0 packs/day for 16 years    Types: Cigarettes  . Smokeless tobacco: Never Used  . Alcohol Use: 1.2 oz/week    2 Cans of beer per week  . Drug Use: No  . Sexually Active: Not Currently   Other Topics Concern    . Not on file   Social History Narrative  . No narrative on file    Review of Systems: Pertinent positive and negative review of systems were noted in the above HPI section.  All other review of systems was otherwise negative.  Physical Exam: Vital signs in last 24 hours: Temp:  [98 F (36.7 C)-98.7 F (37.1 C)] 98.7 F (37.1 C) (05/09 0502) Pulse Rate:  [  63-83] 63  (05/09 0541) Resp:  [14-16] 14  (05/09 0541) BP: (91-112)/(55-76) 98/55 mmHg (05/09 0541) SpO2:  [96 %-97 %] 97 % (05/09 0541) Last BM Date: 12/28/11 General:   Alert,  Well-developed, well-nourished, pleasant and cooperative in NAD Head:  Normocephalic and atraumatic. Eyes:  Sclera clear, no icterus.   Conjunctiva pink. Ears:  Normal auditory acuity. Nose:  No deformity, discharge,  or lesions. Mouth:  No deformity or lesions.   Neck:  Supple; no masses or thyromegaly. Lungs:  Clear throughout to auscultation.   No wheezes, crackles, or rhonchi. Heart:  Regular rate and rhythm; no murmurs, clicks, rubs,  or gallops. Abdomen:  Soft,quite tender to light palpation of right flank, and into ruq, no guarding,liver not enlarged by palpation, Bs+,midline incisional scar Msk:  Symmetrical without gross deformities. . Pulses:  Normal pulses noted. Extremities:  Without clubbing or edema. Neurologic:  Alert and  oriented x4;  grossly normal neurologically. Skin:  Intact without significant lesions or rashes.. Psych:  Alert and cooperative. Normal mood and affect.  Intake/Output from previous day: 05/08 0701 - 05/09 0700 In: 320 [P.O.:320] Out: -  Intake/Output this shift:    Lab Results:  Basename 12/29/11 0530 12/28/11 0756  WBC 6.4 10.7*  HGB 12.8 15.4*  HCT 36.9 42.7  PLT 258 281   BMET  Basename 12/30/11 0620 12/29/11 0530 12/28/11 0756  NA 138 138 133*  K 3.8 3.4* 3.6  CL 103 107 102  CO2 23 23 17*  GLUCOSE 93 98 78  BUN 6 6 11   CREATININE 0.77 0.74 0.77  CALCIUM 9.4 8.7 9.5   LFT  Basename  12/30/11 0620  PROT 6.6  ALBUMIN 3.7  AST 627*  ALT 560*  ALKPHOS 76  BILITOT 0.5  BILIDIR --  IBILI --   PT/INR No results found for this basename: LABPROT:2,INR:2 in the last 72 hours Hepatitis Panel  Basename 12/28/11 1124  HEPBSAG NEGATIVE  HCVAB NEGATIVE  HEPAIGM NEGATIVE  HEPBIGM NEGATIVE      Studies/Results: Mr Thoracic Spine W Wo Contrast  12/29/2011  *RADIOLOGY REPORT*  Clinical Data:  Back pain.  Question diskitis or disc degeneration.  MRI THORACIC AND LUMBAR SPINE WITHOUT AND WITH CONTRAST  Technique:  Multiplanar and multiecho pulse sequences of the thoracic and lumbar spine were obtained without and with intravenous contrast.  Contrast: 11mL MULTIHANCE GADOBENATE DIMEGLUMINE 529 MG/ML IV SOLN  Comparison:  CT abdomen pelvis 12/27/2011.  MRI THORACIC SPINE  Findings: There is no fracture.  Scattered Schmorl's nodes are noted, most prominent in the inferior endplates of T6, T9, T10 and T11.  No evidence of infectious process is present.  Alignment is unremarkable.  Thoracic cord demonstrates normal signal.  Imaged paraspinous structures appear normal.  T5-6:  Tiny central protrusion without central canal or foraminal narrowing.  T7-8:  Small central protrusion indents the ventral thecal sac but the central canal and foramina remain widely patent.  T8-9:  Minimal disc bulge without central canal or foraminal narrowing.  T9-10:  Shallow central protrusion without central canal or foraminal narrowing.  T10-11:  Small left paracentral protrusion without central canal or foraminal stenosis.  T11-12:  Small left paracentral protrusion effaces the ventral thecal sac but the central canal and foramina appear open.  Except as noted above, intervertebral disc spaces are unremarkable.  IMPRESSION:  1.  No acute finding.  Negative for diskitis/osteomyelitis. 2.  Multilevel mild disc bulging and small protrusions as detailed above.  Central canal and foramina appear  open at all levels.  MRI  LUMBAR SPINE  Findings: Vertebral body height, signal and alignment are normal. No pathologic enhanced after contrast administration is identified. The conus medullaris is normal in signal and position.  Disc height and hydration are maintained at all levels.  Central canal and foramina are widely patent at all levels.  Visualized paraspinous structures appear normal.  IMPRESSION: Normal exam.  Original Report Authenticated By: Bernadene Bell. Maricela Curet, M.D.   Mr Lumbar Spine W Wo Contrast  12/29/2011  *RADIOLOGY REPORT*  Clinical Data:  Back pain.  Question diskitis or disc degeneration.  MRI THORACIC AND LUMBAR SPINE WITHOUT AND WITH CONTRAST  Technique:  Multiplanar and multiecho pulse sequences of the thoracic and lumbar spine were obtained without and with intravenous contrast.  Contrast: 11mL MULTIHANCE GADOBENATE DIMEGLUMINE 529 MG/ML IV SOLN  Comparison:  CT abdomen pelvis 12/27/2011.  MRI THORACIC SPINE  Findings: There is no fracture.  Scattered Schmorl's nodes are noted, most prominent in the inferior endplates of T6, T9, T10 and T11.  No evidence of infectious process is present.  Alignment is unremarkable.  Thoracic cord demonstrates normal signal.  Imaged paraspinous structures appear normal.  T5-6:  Tiny central protrusion without central canal or foraminal narrowing.  T7-8:  Small central protrusion indents the ventral thecal sac but the central canal and foramina remain widely patent.  T8-9:  Minimal disc bulge without central canal or foraminal narrowing.  T9-10:  Shallow central protrusion without central canal or foraminal narrowing.  T10-11:  Small left paracentral protrusion without central canal or foraminal stenosis.  T11-12:  Small left paracentral protrusion effaces the ventral thecal sac but the central canal and foramina appear open.  Except as noted above, intervertebral disc spaces are unremarkable.  IMPRESSION:  1.  No acute finding.  Negative for diskitis/osteomyelitis. 2.  Multilevel  mild disc bulging and small protrusions as detailed above.  Central canal and foramina appear open at all levels.  MRI LUMBAR SPINE  Findings: Vertebral body height, signal and alignment are normal. No pathologic enhanced after contrast administration is identified. The conus medullaris is normal in signal and position.  Disc height and hydration are maintained at all levels.  Central canal and foramina are widely patent at all levels.  Visualized paraspinous structures appear normal.  IMPRESSION: Normal exam.  Original Report Authenticated By: Bernadene Bell. Maricela Curet, M.D.    IMPRESSION:  #35 31 year old female admitted with right flank pain nausea vomiting and complaints of fever, insetting of recent UTI and bacterial vaginosis treated with a combination of Flagyl Diflucan Bactrim and peridium. Patient had abnormal urine on admission. I am concerned she has had a urinary tract infection with pyelonephritis and a reactive transaminitis, also need to consider pelvic inflammatory disease with a perihepatitis type picture. Also in the differential would be a drug-induced hepatitis (bactrim), or autoimmune hepatitis though less likely. #2 history of prior partial bowel obstruction with laparotomy 2007 #3 history of recurrent urinary tract #4 IBS #5 positive drug screen for THC and cocaine    PLAN: #1 consider CT scan of the abdomen and pelvis with contrast #2 urine culture . #3 patient is not currently on any antibiotic, defer to medicine #4 pelvic exam, especially if CT is not revealing. #5 Do not think she has a primary GI problem, or viral hepatitis etc. I suspect this is reactive as outlined above. Will discuss with Dr. Rhea Belton.  ANA is negative, ASMA pending will check IGG for completeness.  i spoke with Dr.  Oti- pt is on macrobid. He feels her flank pain is M-S . Ct has been ordered  Alaria Oconnor  12/30/2011, 11:29 AM

## 2011-12-31 DIAGNOSIS — F17200 Nicotine dependence, unspecified, uncomplicated: Secondary | ICD-10-CM

## 2011-12-31 DIAGNOSIS — R1084 Generalized abdominal pain: Secondary | ICD-10-CM

## 2011-12-31 DIAGNOSIS — R740 Nonspecific elevation of levels of transaminase and lactic acid dehydrogenase [LDH]: Secondary | ICD-10-CM

## 2011-12-31 DIAGNOSIS — M545 Low back pain: Secondary | ICD-10-CM

## 2011-12-31 LAB — URINE CULTURE
Colony Count: NO GROWTH
Culture: NO GROWTH

## 2011-12-31 LAB — CBC
MCV: 88.5 fL (ref 78.0–100.0)
Platelets: 256 10*3/uL (ref 150–400)
RBC: 4.53 MIL/uL (ref 3.87–5.11)
RDW: 11.8 % (ref 11.5–15.5)
WBC: 8 10*3/uL (ref 4.0–10.5)

## 2011-12-31 LAB — COMPREHENSIVE METABOLIC PANEL
ALT: 526 U/L — ABNORMAL HIGH (ref 0–35)
AST: 526 U/L — ABNORMAL HIGH (ref 0–37)
Albumin: 3.4 g/dL — ABNORMAL LOW (ref 3.5–5.2)
CO2: 21 mEq/L (ref 19–32)
Chloride: 105 mEq/L (ref 96–112)
Creatinine, Ser: 0.81 mg/dL (ref 0.50–1.10)
GFR calc non Af Amer: >90 mL/min (ref >90)
Potassium: 4.2 mEq/L (ref 3.5–5.1)
Sodium: 136 mEq/L (ref 135–145)
Total Bilirubin: 0.2 mg/dL — ABNORMAL LOW (ref 0.3–1.2)

## 2011-12-31 LAB — IGG: IgG (Immunoglobin G), Serum: 1010 mg/dL (ref 690–1700)

## 2011-12-31 MED ORDER — POLYETHYLENE GLYCOL 3350 17 G PO PACK
17.0000 g | PACK | Freq: Every day | ORAL | Status: DC
  Administered 2011-12-31: 17 g via ORAL
  Filled 2011-12-31 (×2): qty 1

## 2011-12-31 NOTE — Progress Notes (Signed)
Patient seen, examined, and I agree with the above documentation, including the assessment and plan. CT reviewed.  Intussusception may have been a transient phenomenon and I am not sure this is clinically relevant. Transaminitis remains impressive, with AST/ALT ~ 500, IgG and ANA nl/neg making AIH very, very unlikely.  Top of Ddx remains drug-induced. Agree with d/c macrobid and any other potential hepatotoxins for now. Follow hep panel daily. Cont Miralax

## 2011-12-31 NOTE — Progress Notes (Signed)
[facility] Daily Rounding Note 12/31/2011, 9:11 AM  SUBJECTIVE:       Feels constipated.  No BMs.  No nausea.  Tolerating regular diet.   Ongoing right sided abd pain.  Lower /mid back pain, temporary relief with Dilaudid prn, robaxin, oxycodone,   OBJECTIVE:        General:  Pale, looks sickly but not toxic.     Vital signs in last 24 hours:    Temp:  [97.8 F (36.6 C)-97.9 F (36.6 C)] 97.8 F (36.6 C) (05/10 1610) Pulse Rate:  [85-91] 85  (05/10 0613) Resp:  [18] 18  (05/10 0613) BP: (90-102)/(52-62) 90/52 mmHg (05/10 0613) SpO2:  [94 %-100 %] 100 % (05/10 9604) Weight:  [112 lb (50.803 kg)] 112 lb (50.803 kg) (05/10 0613) Last BM Date: 12/28/11  Heart: RRR Chest: clear b.  + cough. Abdomen:  Soft, ND.  Active BS.  Tender diffusely.  No guard or rebound.  Extremities: no CCE. Neuro/Psych:  Not confused, no tremor.  No agitation but a bit anxious.  Intake/Output from previous day: 05/09 0701 - 05/10 0700 In: 325 [P.O.:325] Out: -   Intake/Output this shift:    Lab Results:  Basename 12/31/11 0605 12/29/11 0530  WBC 8.0 6.4  HGB 13.8 12.8  HCT 40.1 36.9  PLT 256 258   BMET  Basename 12/31/11 0605 12/30/11 0620 12/29/11 0530  NA 136 138 138  K 4.2 3.8 3.4*  CL 105 103 107  CO2 21 23 23   GLUCOSE 86 93 98  BUN 10 6 6   CREATININE 0.81 0.77 0.74  CALCIUM 9.0 9.4 8.7   LFT  Basename 12/31/11 0605 12/30/11 0620 12/29/11 0530  PROT 6.2 6.6 5.8*  ALBUMIN 3.4* 3.7 3.2*  AST 526* 627* 332*  ALT 526* 560* 287*  ALKPHOS 84 76 69  BILITOT 0.2* 0.5 0.3  BILIDIR -- -- --  IBILI -- -- --   PT/INR No results found for this basename: LABPROT:2,INR:2 in the last 72 hours Hepatitis Panel  Basename 12/28/11 1124  HEPBSAG NEGATIVE  HCVAB NEGATIVE  HEPAIGM NEGATIVE  HEPBIGM NEGATIVE  IgG             1010      (norm is 690 - 1710)     12/30/11  Studies/Results: Ct Abdomen Pelvis W Contrast 12/30/2011  *RADIOLOGY REPORT*  Clinical Data: Urinary tract  infection.  Right flank pain. Dysuria.  Nausea vomiting.  CT ABDOMEN AND PELVIS WITH CONTRAST  Technique:  Multidetector CT imaging of the abdomen and pelvis was performed following the standard protocol during bolus administration of intravenous contrast.  Contrast: 80mL OMNIPAQUE IOHEXOL 300 MG/ML  SOLN  Comparison: 12/27/2011  Findings: No focal abnormalities seen in the liver or spleen.  The stomach, duodenum, pancreas, gallbladder, adrenal glands, and kidneys have normal imaging features.  There is no free fluid or lymphadenopathy in the abdomen. A short segment of entero-enteric intussusception is identified in the right abdomen.  This appears to be in the region of the distal jejunum.  It is very short in length, with the intussusceptum measuring only about 3 cm.  There is no evidence for associated wall thickening or perienteric edema.  The lesion is nonobstructing.  Imaging through the pelvis shows no free intraperitoneal fluid. There is no pelvic sidewall lymphadenopathy.  Colon is unremarkable.  The terminal ileum and the appendix are normal.  The uterus and ovaries are normal.  Bone windows reveal no worrisome lytic or sclerotic  osseous lesions.  IMPRESSION: Short segment of small bowel intussusception is identified in the right abdomen. This is not will occur transiently and there is no evidence to suggest that the lesion is obstructing or that there is associated bowel wall edema/inflammation.  Oral contrast material has migrated past this location to the more distal small bowel and colon.  Otherwise unremarkable exam.  I personally called these results by telephone with Clydie Braun, the PA covering for triad hospitalist, at 1952 hours on 12/30/2011.  Original Report Authenticated By: ERIC A. MANSELL, M.D.    ASSESMENT: 1.   right flank pain.  CT shows sb intususception, non-obstructing. Relevance of this finding not clear.  She has no n/v 2007 SBO with laparotomy.  Laparotomy prior to that for ovarian  issues.  PID not evident on CT scan.  2.  Elevated transaminases.  Improving.  ?  Drug induced, Macrobid ? Reactive to UTI/pyelo (no pyelo on CT). Labs do not support autoimmune hepatitis 3.  UTI?  The ua had many squams so it is non-diagnostic for uti.   She has dysuria, but has this frequently 4.  Bacterial vaginosis. 5.  Substance abuse.  THC and  Cocaine positive  6.  IBS 7.  Narcotic induced constipation   PLAN: 1.  Would d/cMacrobid unless it is deemed essential.  Get a cc urine clx in 24 to 48 hours to absolutely define a UTI 2.  Do a pelvic exam to r/o PUD.   3.  miralax for constipation   LOS: 3 days   Jennye Moccasin  12/31/2011, 9:11 AM Pager: [phone]

## 2011-12-31 NOTE — Progress Notes (Addendum)
Subjective: Patient seen and examined, continued to have right-sided and left lower quadrant abdominal pain, 7-8/10. Also complaining of constipation, denies any nausea or vomiting. Patient denies any vaginal discharge ,itching or abnormal odor.  Objective: Vital signs in last 24 hours: Temp:  [97.8 F (36.6 C)-97.9 F (36.6 C)] 97.8 F (36.6 C) (05/10 4010) Pulse Rate:  [85-91] 85  (05/10 0613) Resp:  [18] 18  (05/10 0613) BP: (90-102)/(52-57) 90/52 mmHg (05/10 0613) SpO2:  [100 %] 100 % (05/10 0613) Weight:  [50.803 kg (112 lb)] 50.803 kg (112 lb) (05/10 2725) Weight change:  Last BM Date: 12/28/11  Intake/Output from previous day: 05/09 0701 - 05/10 0700 In: 325 [P.O.:325] Out: -      Physical Exam: General: Alert, awake, oriented x3, in no acute distress. Anxious Heart: Regular rate and rhythm, without murmurs, rubs, gallops. Lungs: Clear to auscultation bilaterally. Abdomen: Soft, diffuse tenderness, nondistended, positive bowel sounds. Extremities: No clubbing cyanosis or edema with positive pedal pulses. Neuro: Grossly intact, nonfocal.    Lab Results: Results for orders placed during the hospital encounter of 12/28/11 (from the past 24 hour(s))  IGG     Status: Normal   Collection Time   12/30/11  4:45 PM      Component Value Range   IgG (Immunoglobin G), Serum 1010  690 - 1700 (mg/dL)  CBC     Status: Normal   Collection Time   12/31/11  6:05 AM      Component Value Range   WBC 8.0  4.0 - 10.5 (K/uL)   RBC 4.53  3.87 - 5.11 (MIL/uL)   Hemoglobin 13.8  12.0 - 15.0 (g/dL)   HCT 36.6  44.0 - 34.7 (%)   MCV 88.5  78.0 - 100.0 (fL)   MCH 30.5  26.0 - 34.0 (pg)   MCHC 34.4  30.0 - 36.0 (g/dL)   RDW 42.5  95.6 - 38.7 (%)   Platelets 256  150 - 400 (K/uL)  COMPREHENSIVE METABOLIC PANEL     Status: Abnormal   Collection Time   12/31/11  6:05 AM      Component Value Range   Sodium 136  135 - 145 (mEq/L)   Potassium 4.2  3.5 - 5.1 (mEq/L)   Chloride 105  96 - 112  (mEq/L)   CO2 21  19 - 32 (mEq/L)   Glucose, Bld 86  70 - 99 (mg/dL)   BUN 10  6 - 23 (mg/dL)   Creatinine, Ser 5.64  0.50 - 1.10 (mg/dL)   Calcium 9.0  8.4 - 33.2 (mg/dL)   Total Protein 6.2  6.0 - 8.3 (g/dL)   Albumin 3.4 (*) 3.5 - 5.2 (g/dL)   AST 951 (*) 0 - 37 (U/L)   ALT 526 (*) 0 - 35 (U/L)   Alkaline Phosphatase 84  39 - 117 (U/L)   Total Bilirubin 0.2 (*) 0.3 - 1.2 (mg/dL)   GFR calc non Af Amer >90  >90 (mL/min)   GFR calc Af Amer >90  >90 (mL/min)    Studies/Results: Ct Abdomen Pelvis W Contrast  12/30/2011  *RADIOLOGY REPORT*  Clinical Data: Urinary tract infection.  Right flank pain. Dysuria.  Nausea vomiting.  CT ABDOMEN AND PELVIS WITH CONTRAST  Technique:  Multidetector CT imaging of the abdomen and pelvis was performed following the standard protocol during bolus administration of intravenous contrast.  Contrast: 80mL OMNIPAQUE IOHEXOL 300 MG/ML  SOLN  Comparison: 12/27/2011  Findings: No focal abnormalities seen in the liver or spleen.  The stomach, duodenum, pancreas, gallbladder, adrenal glands, and kidneys have normal imaging features.  There is no free fluid or lymphadenopathy in the abdomen. A short segment of entero-enteric intussusception is identified in the right abdomen.  This appears to be in the region of the distal jejunum.  It is very short in length, with the intussusceptum measuring only about 3 cm.  There is no evidence for associated wall thickening or perienteric edema.  The lesion is nonobstructing.  Imaging through the pelvis shows no free intraperitoneal fluid. There is no pelvic sidewall lymphadenopathy.  Colon is unremarkable.  The terminal ileum and the appendix are normal.  The uterus and ovaries are normal.  Bone windows reveal no worrisome lytic or sclerotic osseous lesions.  IMPRESSION: Short segment of small bowel intussusception is identified in the right abdomen. This is not will occur transiently and there is no evidence to suggest that the lesion  is obstructing or that there is associated bowel wall edema/inflammation.  Oral contrast material has migrated past this location to the more distal small bowel and colon.  Otherwise unremarkable exam.  I personally called these results by telephone with Clydie Braun, the PA covering for triad hospitalist, at 1952 hours on 12/30/2011.  Original Report Authenticated By: ERIC A. MANSELL, M.D.    Medications:    . iohexol  20 mL Oral Q1 Hr x 2  . methocarbamol  500 mg Oral QID  . nicotine  14 mg Transdermal Daily  . nitrofurantoin (macrocrystal-monohydrate)  100 mg Oral Q12H  . oxyCODONE  10 mg Oral QID  . polyethylene glycol  17 g Oral Daily    acetaminophen, acetaminophen, albuterol, HYDROmorphone (DILAUDID) injection, iohexol, ondansetron (ZOFRAN) IV, ondansetron     Assessment/Plan: Principal Problem: Abdominal pain:  CT abdomen showed short segment small bowel nonobstructing intussusception in the right abdomen. Patient does not have significant symptoms to suggest PID at this time. Continue  MiraLAX for constipation as  Per GI recommendations. *Acute back pain: Felt o be musculo-skeletal in origin. MRI lumbo-sacral spine of 12/29/11, showed no acute finding. Negative for diskitis/osteomyelitis, showed multilevel mild disc bulging and small protrusions. Continue with Robaxin and analgesics.  Active Problems:  Transaminitis: Etiology is unclear. Slowly improving. Acetaminophen levels were normal, and viral hepatitis screen is negative. Liver appearance was normal on ultrasound of 12/28/11.  Normal ANA, patient was seen by GI and they felt that this probably reactive.  Asthma: Stable/Asymptomatic.  IBS (irritable bowel syndrome): She is currently constipated, would treat with laxatives Tobacco abuse: Patient was counseled appropriately, continue Nicoderm CQ patch.   UTI: Patient was treated with a full course of Bactrim. Urinalysis, still shows some residual pyuria, indicative of partially treated  UTI. A further 3-day course of Macrobid was started on 12/29/11 she will complete today. Urine culture pending although will be of low yield.   Substance abuse. Patient's UDS was positive for THC and Cocaine. She was counseled .  Disposition: Hopefully home in one to 2 days if stable        LOS: 3 days   Amy Walls 12/31/2011, 2:08 PM

## 2011-12-31 NOTE — Progress Notes (Signed)
Utilization review completed. Lycan Davee, RN, BSN. 12/30/11  

## 2011-12-31 NOTE — Clinical Social Work Psychosocial (Signed)
     Clinical Social Work Department BRIEF PSYCHOSOCIAL ASSESSMENT 12/31/2011  Patient:  Amy Walls, Amy Walls     Account Number:  [id number]     Admit date:  12/28/2011  Clinical Social Worker:  Dennison Bulla  Date/Time:  12/31/2011 03:00 PM  Referred by:  Physician  Date Referred:  12/31/2011 Referred for  Substance Abuse   Other Referral:   Interview type:  Patient Other interview type:    PSYCHOSOCIAL DATA Living Status:  FAMILY Admitted from facility:   Level of care:   Primary support name:  Lupita Leash Primary support relationship to patient:  PARENT Degree of support available:   Adequate    CURRENT CONCERNS Current Concerns  Substance Abuse   Other Concerns:    SOCIAL WORK ASSESSMENT / PLAN CSW received referral due to patient testing positive for THC and cocaine. CSW met with patient at bedside. Patient was alert and oriented and walking around the room. No visitors present. CSW introduced herself and explained CSW role. Patient agreeable to CSW consult.    CSW spoke with patient regarding home life and supports. Patient has a job at [employer] and is involved with family. Patient reports that she went to a party the night before admission to the hospital. Patient reports no habitual substance use but reports that she drank alcohol and smoked THC at the party. Patient reported that she feels the [facility] was laced with cocaine. Patient reports she is aware of medical affects of substance use. Patient reports that she only used substances socially. Patient reports no previous use and reports no problems with substance use. CSW completed SBIRT with patient. CSW offered resources for patient but patient politely declined. Patient reports no further concerns at this time. CSW is signing off but available if needed.   Assessment/plan status:  No Further Intervention Required Other assessment/ plan:   SBIRT   Information/referral to community resources:   Patient declined resources      PATIENTS/FAMILYS RESPONSE TO PLAN OF CARE: Patient was alert and oriented. Patient reported she only used substances socially and although she was receptive to CSW consult, patient declined all resources.       [place], [place] [phone] (Coverage for Lovette Cliche)

## 2011-12-31 NOTE — Progress Notes (Signed)
Physical Therapy Treatment Patient Details Name: Amy Walls MRN: [id number] DOB: [birth year] Today's Date: 12/31/2011 Time: 2956-2130 PT Time Calculation (min): 13 min  PT Assessment / Plan / Recommendation Comments on Treatment Session  Pt progressing requiring less assistance this session. Pt still extremely limited by pain, unable to determine cause. Pt able to tolerate stretches and strengthening this session.    Follow Up Recommendations  Outpatient PT    Barriers to Discharge        Equipment Recommendations  None recommended by PT    Recommendations for Other Services    Frequency Min 2X/week   Plan Discharge plan remains appropriate;Frequency remains appropriate    Precautions / Restrictions Precautions Precautions: None Restrictions Weight Bearing Restrictions: No       Mobility  Bed Mobility Bed Mobility: Supine to Sit;Sitting - Scoot to Edge of Bed Rolling Left: 6: Modified independent (Device/Increase time) Left Sidelying to Sit: 6: Modified independent (Device/Increase time) Transfers Transfers: Sit to Stand;Stand to Sit Sit to Stand: 5: Supervision;With upper extremity assist;From bed Stand to Sit: 5: Supervision;With upper extremity assist;To bed Details for Transfer Assistance: VC for hand placement. Pt with increased time to complete transfers Ambulation/Gait Ambulation/Gait Assistance: 4: Min guard;4: Min assist Ambulation Distance (Feet): 150 Feet Assistive device: 1 person hand held assist Ambulation/Gait Assistance Details: Pt with flexed posture throughout ambulation, and with cueing was able to correct posture although still had increased anterior pelvic tilt and hip flexion. Pt with increased steadiness this ambulation trial although still required minguard-min assist for stability due to pain during ambulation Gait Pattern: Step-to pattern;Decreased stride length;Antalgic;Trunk flexed Gait velocity: slow Stairs: No    Exercises General Exercises  - Lower Extremity Quad Sets: AROM;Both;10 reps;Supine Gluteal Sets: AROM;Strengthening;10 reps Straight Leg Raises: AROM;Strengthening;Both;10 reps (able to elevate LLE>RLE) Other Exercises Other Exercises: Knee to chest with 10 second hold, able to maintain hold on LLE> RLE   PT Diagnosis:    PT Problem List:   PT Treatment Interventions:     PT Goals Acute Rehab PT Goals PT Goal Formulation: With patient PT Goal: Rolling Supine to Left Side - Progress: Met PT Goal: Supine/Side to Sit - Progress: Met PT Goal: Sit to Supine/Side - Progress: Progressing toward goal PT Goal: Sit to Stand - Progress: Progressing toward goal PT Goal: Ambulate - Progress: Progressing toward goal Pt will Perform Home Exercise Program: Independently PT Goal: Perform Home Exercise Program - Progress: Goal set today  Visit Information  Last PT Received On: 12/31/11 Assistance Needed: +1    Subjective Data  Subjective: Pt reports just using the bathroom which increased back pain   Cognition  Overall Cognitive Status: Appears within functional limits for tasks assessed/performed Arousal/Alertness: Awake/alert Orientation Level: Oriented X4 / Intact Behavior During Session: [facility] for tasks performed    Balance     End of Session PT - End of Session Equipment Utilized During Treatment: Gait belt Activity Tolerance: Patient limited by pain Patient left: in bed;with call bell/phone within reach    Milana Kidney 12/31/2011, 5:17 PM  12/31/2011 Milana Kidney DPT PAGER: [phone] OFFICE: [phone]

## 2012-01-01 DIAGNOSIS — K561 Intussusception: Secondary | ICD-10-CM

## 2012-01-01 DIAGNOSIS — R1084 Generalized abdominal pain: Secondary | ICD-10-CM

## 2012-01-01 DIAGNOSIS — F17200 Nicotine dependence, unspecified, uncomplicated: Secondary | ICD-10-CM

## 2012-01-01 DIAGNOSIS — R740 Nonspecific elevation of levels of transaminase and lactic acid dehydrogenase [LDH]: Secondary | ICD-10-CM

## 2012-01-01 DIAGNOSIS — M545 Low back pain: Secondary | ICD-10-CM

## 2012-01-01 LAB — COMPREHENSIVE METABOLIC PANEL
AST: 466 U/L — ABNORMAL HIGH (ref 0–37)
CO2: 23 mEq/L (ref 19–32)
Calcium: 9.7 mg/dL (ref 8.4–10.5)
Creatinine, Ser: 0.68 mg/dL (ref 0.50–1.10)
GFR calc Af Amer: >90 mL/min (ref >90)
GFR calc non Af Amer: >90 mL/min (ref >90)
Glucose, Bld: 95 mg/dL (ref 70–99)
Total Protein: 6.9 g/dL (ref 6.0–8.3)

## 2012-01-01 LAB — CBC
MCH: 30.7 pg (ref 26.0–34.0)
MCHC: 34.7 g/dL (ref 30.0–36.0)
MCV: 88.4 fL (ref 78.0–100.0)
Platelets: 268 10*3/uL (ref 150–400)
RBC: 4.66 MIL/uL (ref 3.87–5.11)

## 2012-01-01 MED ORDER — POLYETHYLENE GLYCOL 3350 17 G PO PACK: 17.0000 g | PACK | Freq: Every day | ORAL | Status: AC

## 2012-01-01 MED ORDER — OXYCODONE HCL 10 MG PO TABS: 10.0000 mg | ORAL_TABLET | Freq: Four times a day (QID) | ORAL | Status: AC | PRN

## 2012-01-01 NOTE — Progress Notes (Signed)
Patient seen, examined, and I agree with the above documentation, including the assessment and plan. Slowly improving transaminitis of unclear etiology. I favor drug reaction, but reactive related to infection is possible. Agree with discontinuation of any nonessential medications, Macrobid is now off CT finding of short segment intussusception is likely not clinically relevant, however we will followup with this in clinic, and consider VCE Have strongly encouraged her to avoid all alcohol until liver numbers normalize completely. He voices understanding

## 2012-01-01 NOTE — Progress Notes (Signed)
Discharge instructions and prescriptions reviewed with patient. Note regarding no work on 01-02-12 also given to patient.  Patient verbalized understanding at this time.  All belongings were taken home with patient.  Patient instructed to follow-up with GI if any problem persist.

## 2012-01-01 NOTE — Progress Notes (Signed)
Patient ID: Amy Walls, female   DOB: [birth year], 31 y.o.   MRN: [id number] [facility] Progress Note  Subjective: Has been up walking hall, feels better and says she is ready to get out of here. Her back is still hurting but feels she will be fine if she has some pain medicine at home. Dysuria has resolved, as has lower abdominal discomfort. Had several bm's yesterday. Eating fine.  Objective:  Vital signs in last 24 hours: Temp:  [97.9 F (36.6 C)-98.3 F (36.8 C)] 98.2 F (36.8 C) (05/11 0554) Pulse Rate:  [61-110] 61  (05/11 0554) Resp:  [18] 18  (05/11 0554) BP: (99-121)/(58-74) 99/58 mmHg (05/11 0554) SpO2:  [98 %-100 %] 100 % (05/11 0554) Last BM Date: 12/28/11 General:   Alert,  Well-developed,    in NAD Heart:  Regular rate and rhythm; no murmurs Pulm;clear Abdomen:  Soft, nontender and nondistended. Normal bowel sounds, without guarding Extremities:  Without edema. Neurologic:  Alert and  oriented x4;  grossly normal neurologically. Psych:  Alert and cooperative. Normal mood and affect.  Intake/Output from previous day:   Intake/Output this shift:    Lab Results:  Basename 01/01/12 0720 12/31/11 0605  WBC 7.6 8.0  HGB 14.3 13.8  HCT 41.2 40.1  PLT 268 256   BMET  Basename 01/01/12 0720 12/31/11 0605 12/30/11 0620  NA 135 136 138  K 4.2 4.2 3.8  CL 102 105 103  CO2 23 21 23   GLUCOSE 95 86 93  BUN 12 10 6   CREATININE 0.68 0.81 0.77  CALCIUM 9.7 9.0 9.4   LFT  Basename 01/01/12 0720  PROT 6.9  ALBUMIN 3.7  AST 466*  ALT 491*  ALKPHOS 87  BILITOT 0.3  BILIDIR --  IBILI --     Assessment / Plan: #1 31 yo female with acute back pain-felt musculoskeletal #2 partially treated UTI-retreated and improved, macrobid  d/c'd yesterday #3 transaminitis- etiology unclear- suspect reactive vs med induced (Sulfa ). She is stable and Ok for discharge, but will need outpt follow up for labs in a week- we will arrange thru office. #4 Ct finding of  short segment intussusception - asymptomatic- will plan for capsule endoscopy out pt as well.  Gi will sign off Will arrange follow up appt with Dr. Rhea Belton -office will call her with appt  Principal Problem:  *Acute back pain Active Problems:  Abdominal pain  Transaminitis  Asthma  IBS (irritable bowel syndrome)  Tobacco abuse     LOS: 4 days   Amy Walls  01/01/2012, 10:43 AM

## 2012-01-01 NOTE — Discharge Summary (Signed)
Physician Discharge Summary  Patient ID: Amy Walls MRN: [id number] DOB/AGE: 22-May-1981 31 y.o.  Admit date: 12/28/2011 Discharge date: 01/01/2012  Primary Care Physician:  No primary provider on file.   Discharge Diagnoses:    Patient Active Problem List  Diagnoses  . Acute back pain  . Abdominal pain  . Transaminitis  . Asthma  . IBS (irritable bowel syndrome)  . Tobacco abuse  . Intussusception    Medication List  As of 01/01/2012  2:54 PM   TAKE these medications         Oxycodone HCl 10 MG Tabs   Take 1 tablet (10 mg total) by mouth every 6 (six) hours as needed for pain.      polyethylene glycol packet   Commonly known as: MIRALAX / GLYCOLAX   Take 17 g by mouth daily.             Disposition and Follow-up:  Follow up with Dr Erick Blinks, gastroenterologist.  Consults:  GI  Dr Rhea Belton.  Significant Diagnostic Studies:  US Abdomen Complete  12/28/2011  *RADIOLOGY REPORT*  Clinical Data:  Abdominal pain question biliary colic, transaminitis, question cholecystitis  ULTRASOUND ABDOMEN:  Technique:  Sonography of upper abdominal structures was performed.  Comparison:  02/02/2007 Correlation:  CT abdomen 12/27/2011  Gallbladder:  Normally distended without stones or wall thickening. No pericholecystic fluid or sonographic Murphy sign.  Common bile duct:  Normal caliber 5 mm diameter  Liver:  Normal appearance  IVC:  Normal appearance  Pancreas:  Normal appearance  Spleen:  Normal appearance, 7.9 cm length  Right kidney:  11.2 cm length. Normal morphology without mass or hydronephrosis.  Left kidney:  11.0 cm length. Normal morphology without mass or hydronephrosis.  Aorta:  Normal caliber  Other:  No free fluid  IMPRESSION: Normal exam.  Original Report Authenticated By: Lollie Marrow, M.D.    Brief H and P: For complete details, refer to admission H and P. However, in brief, this is a 31 year old female, with history of bronchial Asthma, IBS, SBO 2007, s/p laparoscopy,  laparoscopy 2001, for "ovarian problems", recently treated for UTI with Bactrim prescribed at [facility], from 12/17/11-12/24/11, presenting with right flank/RUQ abdominal pain. According to patient, following UTI treatment, she felt quite fine, went back to work on 12/26/11, and after work, at between 5:00 PM-6:00 PM, she developed severe right-sided back pain, constant, fluctuating in intensity, associated with fever and chills. She went back to [facility] on 12/27/11, had an abdominal/pelvic CT, which was reportedly negative, then discharged on Valium. She was unable to sleep all night, has continued to remain symptomatic and proceded to the ED. She was admitted for further evaluation, investigation and management.  Physical Exam: On 01/01/12. General: Alert, communicative, fully oriented, talking in complete sentences, not short of breath at rest. Has much less pain on movement.  HEENT: No clinical pallor, no jaundice, no conjunctival injection or discharge. Hydration status is fair.  NECK: Supple, JVP not seen, no carotid bruits, no palpable lymphadenopathy, no palpable goiter.  CHEST: Clinically clear to auscultation, no wheezes, no crackles.  HEART: Sounds 1 and 2 heard, normal, regular, no murmurs.  ABDOMEN: Flat, soft, old surgical scars are noted. No RUQ tenderness today, no palpable organomegaly, no palpable masses, normal bowel sounds.  GENITALIA: Not examined.  LOWER EXTREMITIES: No pitting edema, palpable peripheral pulses.  MUSCULOSKELETAL SYSTEM: Only mild discomfort/tendernes right lower back.  CENTRAL NERVOUS SYSTEM: No focal neurologic deficit on gross examination.  Hospital Course:  Principal Problem:  *Acute back pain: Patient presented as described above, and was found to have  clearly discernible right flank/back pain, in a radicular distribution, associated with right paraspinal muscle spasm, and tenderness, exacerbated by movement. She has no history of antecedent  trauma, although on detailed questioning, when confronted with results of UDS, positive for THC/Cocaine, she now admits that she was at a 2-day party 12/25/11-12/26/11, during which she smoked marijuana laced with Cocaine, so she has only vague recollection of events. We postulate that she may have hurt herself,or pulled a muscle in her back, as this pain is clearly musculo-skeletal. Fortunately, MRI lumbo-sacral spine of 12/29/11, showed no acute finding. Negative for diskitis/osteomyelitis, although there was multilevel mild disc bulging and small protrusions. Central canal and foramina appear open at all levels. She has been reassured accordingly. She was managed with physiotherapy, Robaxin and analgesics, with satisfactory response, and as of 01/01/12, pain had significantly ameliorated.  Active Problems:  1. Abdominal pain: Patient had mild RUQ abdominal pain at presentation, which was no longer evident as of 12/29/11. I suspect this was referred from back pain. Although at presentation she had a mild transaminitis, this appeared insufficient to account for pain of such severity.  2. Transaminitis: Etiology is unclear. AST was 268, ALT 222, with Alkaline phosphatase of 77 at presentation. Patient's alcohol intake is only mild, and she has no previous documented transaminitis. Acetaminophen levels were normal, and viral hepatitis screen is negative, as well as autoantibody tests. Liver appearance was normal on ultrasound of 12/28/11. As of 12/30/11, transaminases had climbed into the 600s, but have since started trending down, with AST of 466 and ALT 491 on 01/01/12. GI consultation, was provided by Dr Erick Blinks, who has opined that this is likely reactive, secondary to meduication, possibly Bactrim. Further improvement is anticipated, and patient will follow up with GI on discharge. 3. Asthma: Stable/Asymptomatic.  4. IBS (irritable bowel syndrome): Appears stable. In view of recent antibiotic treatment, attempted to  screen for C. Diff. Patient however, had no diarrhea since admission.  5. Tobacco abuse: Patient smokes about a pack of cigarettes per day. She has been counseled appropriately, and was managed with Nicoderm CQ patch, during her hospitalization..  6. UTI: Patient was treated with a full course of Bactrim, for a UTI, and concluded treatment on 12/24/11. Repeat  urinalysis, showed some residual pyuria, indicative of partially treated UTI. A further 3-day course of Macrobid was started on 12/29/11 and concluded on 12/31/11. Patient remained apyrexial, and wcc was normal. Blood cultures were negative.  7: Substance abuse. Patient's UDS was positive for THC and Cocaine. She has been counseled appropriately.  8. Intussusception: GI recommended abdominal/pelvic CT scan, at their initial encounter. This was done on 12/30/11, and revealed short segment of small bowel intussusception in the  right abdomen. There was no evidence to suggest that the lesion is obstructing or that there is associated bowel wall edema/inflammation. Oral contrast material  migrated past this location to the more distal small bowel and colon.Otherwise unremarkable exam. Patient remained asymptomatic, had normal bowel movements, and GI plans to arrange outpatient capsule endoscopy.  Comment: Stable for discharge on 01/01/12.  Time spent on Discharge: 35 mins.  Signed: Awab Abebe,CHRISTOPHER 01/01/2012, 2:54 PM

## 2012-01-03 ENCOUNTER — Telehealth: Payer: Self-pay | Admitting: *Deleted

## 2012-01-03 LAB — CULTURE, BLOOD (ROUTINE X 2)
Culture  Setup Time: 201305072256
Culture: NO GROWTH

## 2012-01-03 NOTE — Telephone Encounter (Signed)
Spoke with pt to inform her of the need to repeat her labs on Friday, 01/07/12; gave her directions and the lab hours. . Also, gave her f/u appt on 01/12/12 at 11am. Pt asked how much money it will cost her and I informed her about $184. Pt reports [facility] is supposed to mail her financial assistance papers. She will call back if she needs to change the appt.

## 2012-01-03 NOTE — Telephone Encounter (Signed)
Unable to leave a message; no VM available on cell phone. Scheduled pt for lab on 01/07/12 and for f/u on 01/12/12.

## 2012-01-12 ENCOUNTER — Ambulatory Visit: Payer: Self-pay | Admitting: Internal Medicine

## 2012-01-25 ENCOUNTER — Encounter: Payer: Self-pay | Admitting: Internal Medicine

## 2012-01-31 ENCOUNTER — Other Ambulatory Visit (INDEPENDENT_AMBULATORY_CARE_PROVIDER_SITE_OTHER): Payer: Self-pay

## 2012-01-31 ENCOUNTER — Ambulatory Visit (INDEPENDENT_AMBULATORY_CARE_PROVIDER_SITE_OTHER)
Admission: RE | Admit: 2012-01-31 | Discharge: 2012-01-31 | Disposition: A | Discharge status: Home / Self Care | Payer: Self-pay | Source: Ambulatory Visit | Attending: Internal Medicine | Admitting: Internal Medicine

## 2012-01-31 ENCOUNTER — Encounter: Payer: Self-pay | Admitting: Internal Medicine

## 2012-01-31 ENCOUNTER — Ambulatory Visit (INDEPENDENT_AMBULATORY_CARE_PROVIDER_SITE_OTHER): Payer: Self-pay | Admitting: Internal Medicine

## 2012-01-31 VITALS — BP 100/60 | HR 62 | Ht 59.5 in | Wt 117.4 lb

## 2012-01-31 DIAGNOSIS — R933 Abnormal findings on diagnostic imaging of other parts of digestive tract: Secondary | ICD-10-CM

## 2012-01-31 DIAGNOSIS — R7989 Other specified abnormal findings of blood chemistry: Secondary | ICD-10-CM

## 2012-01-31 DIAGNOSIS — R197 Diarrhea, unspecified: Secondary | ICD-10-CM

## 2012-01-31 DIAGNOSIS — R109 Unspecified abdominal pain: Secondary | ICD-10-CM

## 2012-01-31 LAB — COMPREHENSIVE METABOLIC PANEL
ALT: 337 U/L — ABNORMAL HIGH (ref 0–35)
CO2: 23 mEq/L (ref 19–32)
Calcium: 8.8 mg/dL (ref 8.4–10.5)
Chloride: 110 mEq/L (ref 96–112)
GFR: 104.93 mL/min (ref >60.00)
Potassium: 3.7 mEq/L (ref 3.5–5.1)
Sodium: 141 mEq/L (ref 135–145)
Total Protein: 6.5 g/dL (ref 6.0–8.3)

## 2012-01-31 LAB — CBC WITH DIFFERENTIAL/PLATELET
Basophils Relative: 0.5 % (ref 0.0–3.0)
Eosinophils Absolute: 0.2 10*3/uL (ref 0.0–0.7)
MCHC: 33 g/dL (ref 30.0–36.0)
MCV: 91.7 fl (ref 78.0–100.0)
Monocytes Absolute: 0.8 10*3/uL (ref 0.1–1.0)
Neutrophils Relative %: 55.5 % (ref 43.0–77.0)
Platelets: 268 10*3/uL (ref 150.0–400.0)

## 2012-01-31 MED ORDER — HYOSCYAMINE SULFATE 0.125 MG SL SUBL: 0.1250 mg | SUBLINGUAL_TABLET | SUBLINGUAL | Status: DC | PRN

## 2012-01-31 MED ORDER — DIPHENOXYLATE-ATROPINE 2.5-0.025 MG PO TABS
1.0000 | ORAL_TABLET | Freq: Four times a day (QID) | ORAL | Status: AC | PRN
Start: 2012-01-31 — End: 2012-02-10

## 2012-01-31 MED ORDER — NA SULFATE-K SULFATE-MG SULF 17.5-3.13-1.6 GM/177ML PO SOLN: 1.0000 | Freq: Once | ORAL | Status: AC

## 2012-01-31 NOTE — Progress Notes (Signed)
Subjective:    Patient ID: Amy Walls, female    DOB: Jul 15, 1981, 31 y.o.   MRN: [id number]  HPI Amy Walls is a 31 yo female with hx of asthma, IBD, s/p ex-lap for SBO in 2007 (no records on this surgery, performed at OSH) who is seen in hospital followup. She is alone today. She was hospitalized in May of 2013 and she was seen by the GI consult service for elevated liver enzymes. At that time she had a transaminitis which was significant and approximately 10 times the upper limit of normal. Ultrasound was negative for gallbladder dysfunction/stones in common bile duct was normal. Her ANA was negative hepatitis serologies negative. No clear etiology was determined for her transaminitis, but felt perhaps this is reactive or drug related as she had been treated with multiple antibiotics for UTI round that time. Also during that hospitalization she was having some vague abdominal pain and CT scan was performed which showed a short segment small bowel intussusception in the right abdomen. There was no evidence to suggest lesion or obstruction and no bowel wall inflammation or edema was seen. Oral contrast freely passed this area.  She returns today stating that she has done poorly since discharge from the hospital. She has lost her job and she is distressed by this. She reports ongoing abdominal pain which is diffuse. She reports she's been taking a probiotic and using oxycodone 10 mg for this pain. She reports oxycodone is the only medication that has helped her with this pain. She was given oxycodone on discharge from the hospital, and has run out. She has not seen any other doctor since discharge. She reports diarrhea has become a big issue for her. She reports 15-20 stools per day. This includes nocturnal stools. Her diarrhea is worse postprandially. She reports occasionally her diarrhea is bloody. She overall is a poor appetite, but has been able to eat and drink. She occasionally has nausea and vomiting,  but not recently. No fevers or chills. She has been taking dicyclomine, which was a prescription she got from years ago. This has not helped much. She is also using marijuana to help calm her GI complaints. No further dysuria, frequency, or hesitancy.  Review of Systems As per history of present illness, otherwise notable for anxiety, fatigue, headaches, other systems negative  Past Medical History  Diagnosis Date  . Asthma   . Complication of anesthesia 2007    "lost me on the table when I had upper bowel obstruction"  . PONV (postoperative nausea and vomiting)   . Chronic bronchitis     "yearly"  . Hypoglycemia     "my sugar runs low alot"  . Migraines 12/28/11    "@ least once/wk"  . Recurrent UTI   . Kidney infection     "often"  . Bowel obstruction 2007    "upper bowel"  . Arthritis     "inflammation of all my joints"  . Chronic mid back pain   . Fibromyalgia   . Anxiety   . Depression   . ADHD (attention deficit hyperactivity disorder)    Past Surgical History  Procedure Date  . Bowel resection 04/2006    "upper bowel"  . Ovary surgery ~ 2002    "had 10 inches blood built up; they opened me up and got the blood out"   Current Outpatient Prescriptions  Medication Sig Dispense Refill  . AMBULATORY NON FORMULARY MEDICATION Digestive Advantage Intensive Bowel Support  Take one tablet by mouth  once a day      . AMBULATORY NON FORMULARY MEDICATION Digestive Advantage Gas Defense Formula Take one tablet by mouth once a day      . dicyclomine (BENTYL) 10 MG capsule Take two tablets 2-3 times a day as needed      . etonogestrel (IMPLANON) 68 MG IMPL implant Inject 1 each into the skin once.      . diphenoxylate-atropine (LOMOTIL) 2.5-0.025 MG per tablet Take 1 tablet by mouth 4 (four) times daily as needed for diarrhea or loose stools.  30 tablet  0  . hyoscyamine (LEVSIN SL) 0.125 MG SL tablet Place 1 tablet (0.125 mg total) under the tongue every 4 (four) hours as needed for  cramping.  30 tablet  0  . Na Sulfate-K Sulfate-Mg Sulf SOLN Take 1 kit by mouth once.  354 mL  0  . Oxycodone HCl 10 MG TABS Take 10 mg by mouth every 6 (six) hours as needed.      . promethazine (PHENERGAN) 25 MG tablet Take 25 mg by mouth every 6 (six) hours as needed.       Allergies  Allergen Reactions  . Levaquin (Levofloxacin) Other (See Comments)    "burns my veins when they put it thru the IV; like veins are on fire"  . Morphine And Related Other (See Comments)    "breaks me out in blisters; might have had to do with the ATB I was given w/the Morphine"  . Penicillins Anaphylaxis    "swells up my airways"  . Toradol (Ketorolac Tromethamine) Other (See Comments)    "gave me a shot in my rear and they said it caused paralysis"  . Ultram (Tramadol) Other (See Comments)    "makes my whole body go numb"   Family History  Problem Relation Age of Onset  . Breast cancer Maternal Grandmother   . Colon cancer Maternal Grandmother   . Irritable bowel syndrome Maternal Grandmother    History  Substance Use Topics  . Smoking status: Current Everyday Smoker -- 1.0 packs/day for 16 years    Types: Cigarettes  . Smokeless tobacco: Never Used   Comment: tobacco handout given 01/31/2012  . Alcohol Use: 1.2 oz/week    2 Cans of beer per week  --see HPI.  Ongoing marijuana use, she reports that in May she smoked marijuana laced with cocaine and this explains her positive urine drug screen     Objective:   Physical Exam BP 100/60  Pulse 62  Ht 4' 11.5" (1.511 m)  Wt 117 lb 6.4 oz (53.252 kg)  BMI 23.31 kg/m2  LMP 01/27/2012 Constitutional: Well-developed and well-nourished. No distress. HEENT: Normocephalic and atraumatic. Oropharynx is clear and moist. No oropharyngeal exudate. Conjunctivae are normal. Pupils are equal round and reactive to light. No scleral icterus. Neck: Neck supple. Trachea midline. Cardiovascular: Normal rate, regular rhythm and intact distal pulses. No  M/R/G Pulmonary/chest: Effort normal and breath sounds normal. No wheezing, rales or rhonchi. Abdominal: Soft, midline upper small abdominal scar which is well-healed, diffuse my tenderness to palpation without rebound or guarding nondistended. Bowel sounds active throughout. There are no masses palpable. No hepatosplenomegaly. Extremities: no clubbing, cyanosis, or edema Lymphadenopathy: No cervical adenopathy noted. Neurological: Alert and oriented to person place and time. Skin: Skin is warm and dry. No rashes noted. Psychiatric: Depressed mood and at times tearful. Behavior is normal.  CMP     Component Value Date/Time   NA 135 01/01/2012 0720   K 4.2 01/01/2012  0720   CL 102 01/01/2012 0720   CO2 23 01/01/2012 0720   GLUCOSE 95 01/01/2012 0720   BUN 12 01/01/2012 0720   CREATININE 0.68 01/01/2012 0720   CALCIUM 9.7 01/01/2012 0720   PROT 6.9 01/01/2012 0720   ALBUMIN 3.7 01/01/2012 0720   AST 466* 01/01/2012 0720   ALT 491* 01/01/2012 0720   ALKPHOS 87 01/01/2012 0720   BILITOT 0.3 01/01/2012 0720   GFRNONAA >90 01/01/2012 0720   GFRAA >90 01/01/2012 0720   ANA neg Hep A/B/C neg IgG normal Anti-smooth muscle, AMA -- neg  Imaging May 2013 CT ABDOMEN AND PELVIS WITH CONTRAST   Technique:  Multidetector CT imaging of the abdomen and pelvis was performed following the standard protocol during bolus administration of intravenous contrast.   Contrast: 80mL OMNIPAQUE IOHEXOL 300 MG/ML  SOLN   Comparison: 12/27/2011   Findings: No focal abnormalities seen in the liver or spleen.  The stomach, duodenum, pancreas, gallbladder, adrenal glands, and kidneys have normal imaging features.   There is no free fluid or lymphadenopathy in the abdomen. A short segment of entero-enteric intussusception is identified in the right abdomen.  This appears to be in the region of the distal jejunum.  It is very short in length, with the intussusceptum measuring only about 3 cm.  There is no evidence  for associated wall thickening or perienteric edema.  The lesion is nonobstructing.   Imaging through the pelvis shows no free intraperitoneal fluid. There is no pelvic sidewall lymphadenopathy.  Colon is unremarkable.  The terminal ileum and the appendix are normal.  The uterus and ovaries are normal.   Bone windows reveal no worrisome lytic or sclerotic osseous lesions.   IMPRESSION: Short segment of small bowel intussusception is identified in the right abdomen. This is not will occur transiently and there is no evidence to suggest that the lesion is obstructing or that there is associated bowel wall edema/inflammation.  Oral contrast material has migrated past this location to the more distal small bowel and colon.   Otherwise unremarkable exam.     Assessment & Plan:   31 yo female with hx of asthma, IBD, s/p ex-lap for SBO in 2007 (no records on this surgery, performed at OSH) who is seen in hospital followup for elevated liver enzymes, and now abdominal pain with diarrhea.  1. Elevated LFTs -- no clear etiology was ascertained as to her elevated liver transaminases. It was felt that perhaps this was related to a drug reaction. She did have negative testing for all immune hepatitis, viral hepatitis. We will recheck her liver enzymes today along with complete blood count and INR. If this is felt to resolve she may need a liver biopsy. I've advised that she continue to avoid all alcohol, but she and is still drinking one to 2 beers a week .  2. Abd pain/diarrhea -- her diarrhea seems significant and the differential includes infectious and inflammatory processes. We will order stool studies to include culture, C. difficile, ova and parasite, fecal leukocytes, and fecal elastase. If these are unrevealing, then I recommended colonoscopy for further evaluation and to exclude IBD. I will give her prescription for Lomotil to be used 4 times a day as needed for diarrhea. I will also give  her Levsin 0.25 every 4-6 hours sublingual as needed for abdominal pain spasm. I will perform an abdominal 2 view to rule out obstruction and ileus. Her abnormal CT scan findings from May are noted, and if her workup  is unrevealing, we will repeat the CT scan to better evaluate this intussusception. It is expected for now the intussusception was likely transient, as is often the case in an adult.  She asked about a refill for oxycodone, and notes her allergies to other medications such as tramadol. I've told her that while I am concerned about her pain, I am not going to prescribe narcotic pain medications if she continues to use illicit drugs. We discussed her positive drug test for marijuana and cocaine. I've told her that she can submit a clean urine drug screen that I will prescribe her pain medication. She states that she will not bill to do this today. If in the future, she can submit a negative urine drug screen, then I will consider a pain medication prescription. However, hopefully we can ascertain the etiology of her pain in the coming weeks as described above.

## 2012-01-31 NOTE — Patient Instructions (Addendum)
You have been scheduled for a colonoscopy with propofol. Please follow written instructions given to you at your visit today.  Please pick up your prep kit at the pharmacy within the next 1-3 days.  Your physician has requested that you go to the basement for lab work before leaving today, and stop at Radiology for an X-ray   We have sent  medications to your pharmacy for you to pick up at your convenience.

## 2012-02-01 ENCOUNTER — Other Ambulatory Visit: Payer: Self-pay | Admitting: Internal Medicine

## 2012-02-11 ENCOUNTER — Telehealth: Payer: Self-pay | Admitting: *Deleted

## 2012-02-11 NOTE — Telephone Encounter (Signed)
Mailed pt a letter requesting she have her labs drawn and reminding her of the COLON on 03/02/12.

## 2012-02-11 NOTE — Telephone Encounter (Signed)
Message copied by Florene Glen on Fri Feb 11, 2012  1:49 PM ------      Message from: Daphine Deutscher      Created: Fri Feb 04, 2012 10:59 AM       Did patient come for labs for Pyrtle?

## 2012-03-02 ENCOUNTER — Encounter: Payer: Self-pay | Admitting: Internal Medicine

## 2012-03-02 ENCOUNTER — Telehealth: Payer: Self-pay | Admitting: Internal Medicine

## 2012-08-23 DIAGNOSIS — B182 Chronic viral hepatitis C: Secondary | ICD-10-CM | POA: Insufficient documentation

## 2013-01-15 ENCOUNTER — Emergency Department (HOSPITAL_COMMUNITY)
Admission: EM | Admit: 2013-01-15 | Discharge: 2013-01-16 | Disposition: A | Discharge status: Home / Self Care | Payer: Self-pay | Attending: Emergency Medicine | Admitting: Emergency Medicine

## 2013-01-15 ENCOUNTER — Emergency Department (HOSPITAL_COMMUNITY): Payer: Self-pay

## 2013-01-15 ENCOUNTER — Encounter (HOSPITAL_COMMUNITY): Payer: Self-pay | Admitting: Adult Health

## 2013-01-15 DIAGNOSIS — Z79899 Other long term (current) drug therapy: Secondary | ICD-10-CM | POA: Insufficient documentation

## 2013-01-15 DIAGNOSIS — Z8709 Personal history of other diseases of the respiratory system: Secondary | ICD-10-CM | POA: Insufficient documentation

## 2013-01-15 DIAGNOSIS — F17200 Nicotine dependence, unspecified, uncomplicated: Secondary | ICD-10-CM | POA: Insufficient documentation

## 2013-01-15 DIAGNOSIS — K56609 Unspecified intestinal obstruction, unspecified as to partial versus complete obstruction: Secondary | ICD-10-CM | POA: Insufficient documentation

## 2013-01-15 DIAGNOSIS — Z8639 Personal history of other endocrine, nutritional and metabolic disease: Secondary | ICD-10-CM | POA: Insufficient documentation

## 2013-01-15 DIAGNOSIS — Z8659 Personal history of other mental and behavioral disorders: Secondary | ICD-10-CM | POA: Insufficient documentation

## 2013-01-15 DIAGNOSIS — Z87448 Personal history of other diseases of urinary system: Secondary | ICD-10-CM | POA: Insufficient documentation

## 2013-01-15 DIAGNOSIS — Z3202 Encounter for pregnancy test, result negative: Secondary | ICD-10-CM | POA: Insufficient documentation

## 2013-01-15 DIAGNOSIS — Z862 Personal history of diseases of the blood and blood-forming organs and certain disorders involving the immune mechanism: Secondary | ICD-10-CM | POA: Insufficient documentation

## 2013-01-15 DIAGNOSIS — Z8669 Personal history of other diseases of the nervous system and sense organs: Secondary | ICD-10-CM | POA: Insufficient documentation

## 2013-01-15 DIAGNOSIS — J45909 Unspecified asthma, uncomplicated: Secondary | ICD-10-CM | POA: Insufficient documentation

## 2013-01-15 DIAGNOSIS — Z8739 Personal history of other diseases of the musculoskeletal system and connective tissue: Secondary | ICD-10-CM | POA: Insufficient documentation

## 2013-01-15 DIAGNOSIS — N390 Urinary tract infection, site not specified: Secondary | ICD-10-CM | POA: Insufficient documentation

## 2013-01-15 LAB — COMPREHENSIVE METABOLIC PANEL
ALT: 160 U/L — ABNORMAL HIGH (ref 0–35)
Calcium: 9.7 mg/dL (ref 8.4–10.5)
Creatinine, Ser: 0.74 mg/dL (ref 0.50–1.10)
GFR calc Af Amer: >90 mL/min (ref >90)
Glucose, Bld: 90 mg/dL (ref 70–99)
Sodium: 137 mEq/L (ref 135–145)
Total Protein: 7.9 g/dL (ref 6.0–8.3)

## 2013-01-15 LAB — CBC WITH DIFFERENTIAL/PLATELET
Basophils Relative: 0 % (ref 0–1)
HCT: 45.3 % (ref 36.0–46.0)
Hemoglobin: 15.6 g/dL — ABNORMAL HIGH (ref 12.0–15.0)
Lymphocytes Relative: 27 % (ref 12–46)
Lymphs Abs: 2.8 10*3/uL (ref 0.7–4.0)
MCHC: 34.4 g/dL (ref 30.0–36.0)
Monocytes Absolute: 0.7 10*3/uL (ref 0.1–1.0)
Monocytes Relative: 7 % (ref 3–12)
Neutro Abs: 6.8 10*3/uL (ref 1.7–7.7)
RBC: 5.12 MIL/uL — ABNORMAL HIGH (ref 3.87–5.11)

## 2013-01-15 LAB — URINALYSIS, ROUTINE W REFLEX MICROSCOPIC
Ketones, ur: NEGATIVE mg/dL
Nitrite: POSITIVE — AB
Protein, ur: NEGATIVE mg/dL

## 2013-01-15 LAB — LIPASE, BLOOD: Lipase: 32 U/L (ref 11–59)

## 2013-01-15 MED ORDER — FENTANYL CITRATE 0.05 MG/ML IJ SOLN
INTRAMUSCULAR | Status: AC
  Filled 2013-01-15: qty 2

## 2013-01-15 MED ORDER — MORPHINE SULFATE 4 MG/ML IJ SOLN
4.0000 mg | Freq: Once | INTRAMUSCULAR | Status: AC
  Administered 2013-01-15: 4 mg via INTRAVENOUS
  Filled 2013-01-15: qty 1

## 2013-01-15 MED ORDER — ONDANSETRON 4 MG PO TBDP
8.0000 mg | ORAL_TABLET | Freq: Once | ORAL | Status: AC
  Administered 2013-01-15: 8 mg via ORAL
  Filled 2013-01-15: qty 2

## 2013-01-15 MED ORDER — IOHEXOL 300 MG/ML  SOLN
100.0000 mL | Freq: Once | INTRAMUSCULAR | Status: AC | PRN
  Administered 2013-01-15: 100 mL via INTRAVENOUS

## 2013-01-15 MED ORDER — HYDROMORPHONE HCL PF 1 MG/ML IJ SOLN
1.0000 mg | Freq: Once | INTRAMUSCULAR | Status: AC
  Administered 2013-01-15: 1 mg via INTRAVENOUS
  Filled 2013-01-15: qty 1

## 2013-01-15 MED ORDER — FENTANYL CITRATE 0.05 MG/ML IJ SOLN
50.0000 ug | Freq: Once | INTRAMUSCULAR | Status: AC
  Administered 2013-01-15: 50 ug via NASAL

## 2013-01-15 MED ORDER — IOHEXOL 300 MG/ML  SOLN
25.0000 mL | INTRAMUSCULAR | Status: AC
  Administered 2013-01-15 (×2): 25 mL via ORAL

## 2013-01-15 MED ORDER — ONDANSETRON HCL 4 MG/2ML IJ SOLN
4.0000 mg | Freq: Once | INTRAMUSCULAR | Status: AC
  Administered 2013-01-15: 4 mg via INTRAVENOUS
  Filled 2013-01-15: qty 2

## 2013-01-15 MED ORDER — SODIUM CHLORIDE 0.9 % IV BOLUS (SEPSIS)
1000.0000 mL | Freq: Once | INTRAVENOUS | Status: AC
  Administered 2013-01-15: 1000 mL via INTRAVENOUS

## 2013-01-15 NOTE — ED Provider Notes (Signed)
History     CSN: [id number]  Arrival date & time 01/15/13  1626   First MD Initiated Contact with Patient 01/15/13 1926      Chief Complaint  Patient presents with  . Abdominal Pain    (Consider location/radiation/quality/duration/timing/severity/associated sxs/prior treatment) HPI Comments: Patient is a 32 year old female with a past medical history of pervious bowel obstruction who presents with sudden onset of abdominal pain that started last night. The pain is located in her right abdomen and radiates around to her back. The pain is described as burning and severe. The pain started gradually and progressively worsened since the onset. No alleviating/aggravating factors. The patient has tried nothing for symptoms without relief. Associated symptoms include NVD and fever. Patient denies headache, chest pain, SOB, dysuria, constipation, abnormal vaginal bleeding/discharge.     Patient is a 32 y.o. female presenting with abdominal pain.  Abdominal Pain Associated symptoms include abdominal pain, a fever, nausea and vomiting.    Past Medical History  Diagnosis Date  . Asthma   . Complication of anesthesia 2007    "lost me on the table when I had upper bowel obstruction"  . PONV (postoperative nausea and vomiting)   . Chronic bronchitis     "yearly"  . Hypoglycemia     "my sugar runs low alot"  . Migraines 12/28/11    "@ least once/wk"  . Recurrent UTI   . Kidney infection     "often"  . Bowel obstruction 2007    "upper bowel"  . Arthritis     "inflammation of all my joints"  . Chronic mid back pain   . Fibromyalgia   . Anxiety   . Depression   . ADHD (attention deficit hyperactivity disorder)     Past Surgical History  Procedure Laterality Date  . Bowel resection  04/2006    "upper bowel"  . Ovary surgery  ~ 2002    "had 10 inches blood built up; they opened me up and got the blood out"    Family History  Problem Relation Age of Onset  . Breast cancer Maternal  Grandmother   . Colon cancer Maternal Grandmother   . Irritable bowel syndrome Maternal Grandmother     History  Substance Use Topics  . Smoking status: Current Every Day Smoker -- 1.00 packs/day for 16 years    Types: Cigarettes  . Smokeless tobacco: Never Used     Comment: tobacco handout given 01/31/2012  . Alcohol Use: 1.2 oz/week    2 Cans of beer per week    OB History   Grav Para Term Preterm Abortions TAB SAB Ect Mult Living                  Review of Systems  Constitutional: Positive for fever.  Gastrointestinal: Positive for nausea, vomiting, abdominal pain and diarrhea.  All other systems reviewed and are negative.    Allergies  Levaquin; Morphine and related; Penicillins; Toradol; Ultram; and Acetaminophen  Home Medications   Current Outpatient Rx  Name  Route  Sig  Dispense  Refill  . AMBULATORY NON FORMULARY MEDICATION      Digestive Advantage Intensive Bowel Support  Take one tablet by mouth once a day         . AMBULATORY NON FORMULARY MEDICATION      Digestive Advantage Gas Defense Formula Take one tablet by mouth once a day         . dicyclomine (BENTYL) 10 MG capsule  Oral   Take 20 mg by mouth 3 (three) times daily as needed (stomach pain).          Marland Kitchen etonogestrel (IMPLANON) 68 MG IMPL implant   Subcutaneous   Inject 1 each into the skin once.         Marland Kitchen EXPIRED: hyoscyamine (LEVSIN SL) 0.125 MG SL tablet   Sublingual   Place 1 tablet (0.125 mg total) under the tongue every 4 (four) hours as needed for cramping.   30 tablet   0   . Oxycodone HCl 10 MG TABS   Oral   Take 10 mg by mouth every 6 (six) hours as needed.         . promethazine (PHENERGAN) 25 MG tablet   Oral   Take 25 mg by mouth every 6 (six) hours as needed.           BP 116/75  Pulse 91  Temp(Src) 98.1 F (36.7 C) (Oral)  Resp 22  SpO2 98%  Physical Exam  Nursing note and vitals reviewed. Constitutional: She is oriented to person, place, and  time. She appears well-developed and well-nourished. No distress.  HENT:  Head: Normocephalic and atraumatic.  Eyes: Conjunctivae and EOM are normal. Pupils are equal, round, and reactive to light. No scleral icterus.  Neck: Normal range of motion.  Cardiovascular: Normal rate and regular rhythm.  Exam reveals no gallop and no friction rub.   No murmur heard. Pulmonary/Chest: Effort normal and breath sounds normal. She has no wheezes. She has no rales. She exhibits no tenderness.  Abdominal: Soft. She exhibits no distension. There is tenderness. There is no rebound and no guarding.  RLQ and RUQ tenderness to palpation. No peritoneal signs.   Musculoskeletal: Normal range of motion.  Neurological: She is alert and oriented to person, place, and time. Coordination normal.  Speech is goal-oriented. Moves limbs without ataxia.   Skin: Skin is warm and dry.  Psychiatric: She has a normal mood and affect. Her behavior is normal.    ED Course  Procedures (including critical care time)  Labs Reviewed  COMPREHENSIVE METABOLIC PANEL - Abnormal; Notable for the following:    AST 146 (*)    ALT 160 (*)    All other components within normal limits  CBC WITH DIFFERENTIAL - Abnormal; Notable for the following:    RBC 5.12 (*)    Hemoglobin 15.6 (*)    All other components within normal limits  URINALYSIS, ROUTINE W REFLEX MICROSCOPIC - Abnormal; Notable for the following:    APPearance CLOUDY (*)    Hgb urine dipstick LARGE (*)    Nitrite POSITIVE (*)    Leukocytes, UA TRACE (*)    All other components within normal limits  URINE MICROSCOPIC-ADD ON - Abnormal; Notable for the following:    Squamous Epithelial / LPF FEW (*)    Bacteria, UA MANY (*)    All other components within normal limits  URINE CULTURE  LIPASE, BLOOD  POCT PREGNANCY, URINE   Ct Abdomen Pelvis W Contrast  01/16/2013   *RADIOLOGY REPORT*  Clinical Data: Right-sided back and upper quadrant abdominal pain. Nausea and  diarrhea.  CT ABDOMEN AND PELVIS WITH CONTRAST  Technique:  Multidetector CT imaging of the abdomen and pelvis was performed following the standard protocol during bolus administration of intravenous contrast.  Contrast: OMNIPAQUE IOHEXOL 300 MG/ML  SOLN  Comparison: Abdominal radiograph performed 01/31/2012, and CT of the abdomen and pelvis performed 12/30/2011  Findings: The visualized  lung bases are clear.  A 0.9 cm vague hypodensity at the hepatic dome is nonspecific. This appears stable from 2013 and is likely benign.  The liver is otherwise unremarkable in appearance, aside from mild fatty infiltration.  The spleen is within normal limits.  The gallbladder is within normal limits.  The pancreas and adrenal glands are unremarkable.  The kidneys are unremarkable in appearance.  There is no evidence of hydronephrosis.  No renal or ureteral stones are seen.  No perinephric stranding is appreciated.  No free fluid is identified.  The small bowel is unremarkable in appearance.  The stomach is within normal limits.  No acute vascular abnormalities are seen.  The appendix is normal in caliber and contains contrast, without evidence for appendicitis.  Contrast progresses to the level of the proximal sigmoid colon.  The colon is unremarkable in appearance.  The bladder is moderately distended and grossly unremarkable.  The uterus is within normal limits.  The ovaries are relatively symmetric; no suspicious adnexal masses are seen.  No inguinal lymphadenopathy is seen.  No acute osseous abnormalities are identified.  IMPRESSION:  1.  No acute abnormality seen within the abdomen or pelvis. 2.  Nonspecific 0.9 cm vague hypodensity within the hepatic dome; this appears relatively stable from 2013 and is likely benign. Mild diffuse fatty infiltration within the liver.   Original Report Authenticated By: Tonia Ghent, M.D.     1. UTI (urinary tract infection)       MDM  7:48 PM Labs pending. Vitals stable  and patient is afebrile. CT abdomen pelvis pending.   1:57 AM Labs show elevated liver enzymes per usual. Urinalysis shows UTI. CT abdomen pelvis shows no acute process. Patient will be treated for UTI and discharged. Patient will receive IV cipro here. Patient afebrile with stable vitals.       Emilia Beck, [place] 01/17/13 [phone]

## 2013-01-15 NOTE — ED Notes (Signed)
Pt unable to urinate due to pain

## 2013-01-15 NOTE — ED Notes (Signed)
Pt tearful and moaning, states "it's my liver." Pt pointing to right flank and states that is where pain is.

## 2013-01-15 NOTE — ED Provider Notes (Signed)
Patient has been having abdominal pain that awoke her in the night described as a burning sensation in the right upper quadrant. On exam she has abdominal tenderness in both her right and left upper quadrants and epigastrium, really minimal tenderness the lower abdomen. She is not peritoneal, she does not have guarding, she did not have hepatosplenomegaly or icterus of her cornea. Lungs and heart are normal without tachycardia or abnormal lung sounds, no peripheral edema.  Labs show slight transaminitis however this is improved compared to prior values.  She does have a history of an intussusception requiring surgery in the past, she will need to be reevaluated with CT scan as she does appear to be uncomfortable with pain.   Medical screening examination/treatment/procedure(s) were conducted as a shared visit with non-physician practitioner(s) and myself.  I personally evaluated the patient during the encounter    Vida Roller, MD 01/15/13 2053

## 2013-01-15 NOTE — ED Notes (Signed)
Pt c/o right sided back and upper quadrant abdominal pain, she states, "Its my liver, it has happened before. I just can't explain it. I don't know what I have" pain began last night associated with nausea and diarrhea described as water.  Pt is tearful and restless.

## 2013-01-16 ENCOUNTER — Emergency Department (HOSPITAL_COMMUNITY): Payer: Self-pay

## 2013-01-16 ENCOUNTER — Encounter (HOSPITAL_COMMUNITY): Payer: Self-pay | Admitting: Radiology

## 2013-01-16 MED ORDER — HYDROMORPHONE HCL PF 1 MG/ML IJ SOLN
1.0000 mg | Freq: Once | INTRAMUSCULAR | Status: AC
  Administered 2013-01-16: 1 mg via INTRAVENOUS
  Filled 2013-01-16: qty 1

## 2013-01-16 MED ORDER — PROMETHAZINE HCL 25 MG PO TABS
25.0000 mg | ORAL_TABLET | ORAL | Status: DC | PRN
  Administered 2013-01-16: 25 mg via ORAL
  Filled 2013-01-16: qty 1

## 2013-01-16 MED ORDER — SULFAMETHOXAZOLE-TMP DS 800-160 MG PO TABS
1.0000 | ORAL_TABLET | Freq: Once | ORAL | Status: AC
  Administered 2013-01-16: 1 via ORAL
  Filled 2013-01-16: qty 1

## 2013-01-16 MED ORDER — OXYCODONE-ACETAMINOPHEN 5-325 MG PO TABS: 2.0000 | ORAL_TABLET | ORAL | Status: DC | PRN

## 2013-01-16 MED ORDER — CIPROFLOXACIN IN D5W 400 MG/200ML IV SOLN
400.0000 mg | Freq: Once | INTRAVENOUS | Status: AC
  Administered 2013-01-16: 400 mg via INTRAVENOUS
  Filled 2013-01-16: qty 200

## 2013-01-16 MED ORDER — PROMETHAZINE HCL 25 MG PO TABS: 25.0000 mg | ORAL_TABLET | Freq: Four times a day (QID) | ORAL | Status: DC | PRN

## 2013-01-16 MED ORDER — SULFAMETHOXAZOLE-TRIMETHOPRIM 800-160 MG PO TABS: 1.0000 | ORAL_TABLET | Freq: Two times a day (BID) | ORAL | Status: DC
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
## 2013-01-16 NOTE — ED Notes (Signed)
Phenergan requested from pharmacy

## 2013-01-16 NOTE — ED Notes (Signed)
The pts iv site does not appear to be infiltrated nss running  No difficulty

## 2013-01-16 NOTE — ED Notes (Signed)
Pt returned from xray.  Iv site ok pain gone.  Food given

## 2013-01-16 NOTE — ED Notes (Signed)
i attempted to give bactrim but the pt reports that she has to take phenegan when she takes that.  On her chart she has an allergy to phenergan.  When questioned she can take  Phenergan.  So she has no allergy tp phenergan

## 2013-01-16 NOTE — ED Notes (Signed)
Pt returned from CT requesting more pain meds.

## 2013-01-16 NOTE — ED Notes (Signed)
The pt does not want the percocet because it has tylenol in it.  Dr i Lynelle Doctor thinks that the pt can take percocet without any problems.

## 2013-01-17 LAB — URINE CULTURE: Colony Count: >=100000

## 2013-01-18 NOTE — ED Notes (Signed)
Post ED Visit - Positive Culture Follow-up  Culture report reviewed by antimicrobial stewardship pharmacist: []  Wes Dulaney, Pharm.D., BCPS []  Celedonio Miyamoto, Pharm.D., BCPS []  Georgina Pillion, Pharm.D., BCPS [x]  [place], [address].D., BCPS, AAHIVP []  Estella Husk, Pharm.D., BCPS, AAHIV  Positive urine culture Treated with *Sulfa-trim organism sensitive to the same and no further patient follow-up is required at this time.  Larena Sox 01/18/2013, 5:52 PM

## 2013-01-19 NOTE — ED Provider Notes (Signed)
Medical screening examination/treatment/procedure(s) were performed by non-physician practitioner and as supervising physician I was immediately available for consultation/collaboration.    Iverson Sees D Laurajean Hosek, MD 01/19/13 0721 

## 2014-04-24 ENCOUNTER — Emergency Department (HOSPITAL_COMMUNITY)
Admission: EM | Admit: 2014-04-24 | Discharge: 2014-04-24 | Disposition: A | Discharge status: Home / Self Care | Payer: Self-pay | Attending: Emergency Medicine | Admitting: Emergency Medicine

## 2014-04-24 ENCOUNTER — Encounter (HOSPITAL_COMMUNITY): Payer: Self-pay | Admitting: Emergency Medicine

## 2014-04-24 ENCOUNTER — Emergency Department (HOSPITAL_COMMUNITY): Payer: Self-pay

## 2014-04-24 DIAGNOSIS — R112 Nausea with vomiting, unspecified: Secondary | ICD-10-CM | POA: Insufficient documentation

## 2014-04-24 DIAGNOSIS — Z8719 Personal history of other diseases of the digestive system: Secondary | ICD-10-CM | POA: Insufficient documentation

## 2014-04-24 DIAGNOSIS — Z79899 Other long term (current) drug therapy: Secondary | ICD-10-CM | POA: Insufficient documentation

## 2014-04-24 DIAGNOSIS — Z792 Long term (current) use of antibiotics: Secondary | ICD-10-CM | POA: Insufficient documentation

## 2014-04-24 DIAGNOSIS — Z880 Allergy status to penicillin: Secondary | ICD-10-CM | POA: Insufficient documentation

## 2014-04-24 DIAGNOSIS — N12 Tubulo-interstitial nephritis, not specified as acute or chronic: Secondary | ICD-10-CM | POA: Insufficient documentation

## 2014-04-24 DIAGNOSIS — Z8639 Personal history of other endocrine, nutritional and metabolic disease: Secondary | ICD-10-CM | POA: Insufficient documentation

## 2014-04-24 DIAGNOSIS — Z87440 Personal history of urinary (tract) infections: Secondary | ICD-10-CM | POA: Insufficient documentation

## 2014-04-24 DIAGNOSIS — G8929 Other chronic pain: Secondary | ICD-10-CM | POA: Insufficient documentation

## 2014-04-24 DIAGNOSIS — Z8659 Personal history of other mental and behavioral disorders: Secondary | ICD-10-CM | POA: Insufficient documentation

## 2014-04-24 DIAGNOSIS — R000 Tachycardia, unspecified: Secondary | ICD-10-CM | POA: Insufficient documentation

## 2014-04-24 DIAGNOSIS — F17200 Nicotine dependence, unspecified, uncomplicated: Secondary | ICD-10-CM | POA: Insufficient documentation

## 2014-04-24 DIAGNOSIS — Z87442 Personal history of urinary calculi: Secondary | ICD-10-CM | POA: Insufficient documentation

## 2014-04-24 DIAGNOSIS — Z3202 Encounter for pregnancy test, result negative: Secondary | ICD-10-CM | POA: Insufficient documentation

## 2014-04-24 DIAGNOSIS — Z862 Personal history of diseases of the blood and blood-forming organs and certain disorders involving the immune mechanism: Secondary | ICD-10-CM | POA: Insufficient documentation

## 2014-04-24 DIAGNOSIS — J45909 Unspecified asthma, uncomplicated: Secondary | ICD-10-CM | POA: Insufficient documentation

## 2014-04-24 DIAGNOSIS — Z8679 Personal history of other diseases of the circulatory system: Secondary | ICD-10-CM | POA: Insufficient documentation

## 2014-04-24 DIAGNOSIS — M129 Arthropathy, unspecified: Secondary | ICD-10-CM | POA: Insufficient documentation

## 2014-04-24 DIAGNOSIS — R109 Unspecified abdominal pain: Secondary | ICD-10-CM | POA: Insufficient documentation

## 2014-04-24 LAB — CBC WITH DIFFERENTIAL/PLATELET
BASOS ABS: 0 10*3/uL (ref 0.0–0.1)
BASOS PCT: 0 % (ref 0–1)
EOS ABS: 0.1 10*3/uL (ref 0.0–0.7)
EOS PCT: 1 % (ref 0–5)
HCT: 44.2 % (ref 36.0–46.0)
Hemoglobin: 15.6 g/dL — ABNORMAL HIGH (ref 12.0–15.0)
Lymphocytes Relative: 42 % (ref 12–46)
Lymphs Abs: 2.8 10*3/uL (ref 0.7–4.0)
MCH: 31.6 pg (ref 26.0–34.0)
MCHC: 35.3 g/dL (ref 30.0–36.0)
MCV: 89.7 fL (ref 78.0–100.0)
Monocytes Absolute: 0.5 10*3/uL (ref 0.1–1.0)
Monocytes Relative: 8 % (ref 3–12)
NEUTROS PCT: 49 % (ref 43–77)
Neutro Abs: 3.3 10*3/uL (ref 1.7–7.7)
PLATELETS: 252 10*3/uL (ref 150–400)
RBC: 4.93 MIL/uL (ref 3.87–5.11)
RDW: 12.4 % (ref 11.5–15.5)
WBC: 6.6 10*3/uL (ref 4.0–10.5)

## 2014-04-24 LAB — URINE MICROSCOPIC-ADD ON

## 2014-04-24 LAB — BASIC METABOLIC PANEL
ANION GAP: 12 (ref 5–15)
BUN: 12 mg/dL (ref 6–23)
CALCIUM: 9.4 mg/dL (ref 8.4–10.5)
CO2: 24 mEq/L (ref 19–32)
Chloride: 101 mEq/L (ref 96–112)
Creatinine, Ser: 0.71 mg/dL (ref 0.50–1.10)
Glucose, Bld: 82 mg/dL (ref 70–99)
Potassium: 4.3 mEq/L (ref 3.7–5.3)
SODIUM: 137 meq/L (ref 137–147)

## 2014-04-24 LAB — URINALYSIS, ROUTINE W REFLEX MICROSCOPIC
Glucose, UA: NEGATIVE mg/dL
HGB URINE DIPSTICK: NEGATIVE
Ketones, ur: 15 mg/dL — AB
NITRITE: POSITIVE — AB
PROTEIN: NEGATIVE mg/dL
SPECIFIC GRAVITY, URINE: 1.022 (ref 1.005–1.030)
UROBILINOGEN UA: 1 mg/dL (ref 0.0–1.0)
pH: 6 (ref 5.0–8.0)

## 2014-04-24 LAB — POC URINE PREG, ED: PREG TEST UR: NEGATIVE

## 2014-04-24 MED ORDER — CEFUROXIME AXETIL 500 MG PO TABS
500.0000 mg | ORAL_TABLET | Freq: Two times a day (BID) | ORAL | Status: DC
Start: 2014-04-24 — End: 2014-05-29

## 2014-04-24 MED ORDER — CIPROFLOXACIN IN D5W 400 MG/200ML IV SOLN
400.0000 mg | Freq: Once | INTRAVENOUS | Status: DC
  Filled 2014-04-24: qty 200

## 2014-04-24 MED ORDER — OXYCODONE HCL 5 MG PO TABS: 5.0000 mg | ORAL_TABLET | Freq: Four times a day (QID) | ORAL | Status: DC | PRN

## 2014-04-24 MED ORDER — DEXTROSE 5 % IV SOLN
1.0000 g | Freq: Once | INTRAVENOUS | Status: AC
  Administered 2014-04-24: 1 g via INTRAVENOUS
  Filled 2014-04-24: qty 10

## 2014-04-24 MED ORDER — SODIUM CHLORIDE 0.9 % IV BOLUS (SEPSIS)
1000.0000 mL | Freq: Once | INTRAVENOUS | Status: AC
  Administered 2014-04-24: 1000 mL via INTRAVENOUS

## 2014-04-24 MED ORDER — PROMETHAZINE HCL 25 MG PO TABS: 25.0000 mg | ORAL_TABLET | Freq: Four times a day (QID) | ORAL | Status: DC | PRN

## 2014-04-24 MED ORDER — OXYCODONE-ACETAMINOPHEN 5-325 MG PO TABS: 1.0000 | ORAL_TABLET | Freq: Four times a day (QID) | ORAL | Status: DC | PRN

## 2014-04-24 MED ORDER — ONDANSETRON HCL 4 MG/2ML IJ SOLN
4.0000 mg | Freq: Once | INTRAMUSCULAR | Status: AC
  Administered 2014-04-24: 4 mg via INTRAVENOUS
  Filled 2014-04-24: qty 2

## 2014-04-24 MED ORDER — CEFUROXIME AXETIL 500 MG PO TABS: 500.0000 mg | ORAL_TABLET | Freq: Two times a day (BID) | ORAL | Status: DC

## 2014-04-24 MED ORDER — ONDANSETRON HCL 4 MG PO TABS: 4.0000 mg | ORAL_TABLET | Freq: Four times a day (QID) | ORAL | Status: DC

## 2014-04-24 MED ORDER — HYDROMORPHONE HCL PF 1 MG/ML IJ SOLN
1.0000 mg | Freq: Once | INTRAMUSCULAR | Status: AC
Start: 2014-04-24 — End: 2014-04-24
  Administered 2014-04-24: 1 mg via INTRAVENOUS
  Filled 2014-04-24: qty 1

## 2014-04-24 MED ORDER — HYDROMORPHONE HCL PF 1 MG/ML IJ SOLN
1.0000 mg | Freq: Once | INTRAMUSCULAR | Status: AC
  Administered 2014-04-24: 1 mg via INTRAVENOUS
  Filled 2014-04-24: qty 1

## 2014-04-24 NOTE — Discharge Instructions (Signed)
Pyelonephritis, Adult °Pyelonephritis is a kidney infection. A kidney infection can happen quickly, or it can last for a long time. °HOME CARE  °· Take your medicine (antibiotics) as told. Finish it even if you start to feel better. °· Keep all doctor visits as told. °· Drink enough fluids to keep your pee (urine) clear or pale yellow. °· Only take medicine as told by your doctor. °GET HELP RIGHT AWAY IF:  °· You have a fever or lasting symptoms for more than 2-3 days. °· You have a fever and your symptoms suddenly get worse. °· You cannot take your medicine or drink fluids as told. °· You have chills and shaking. °· You feel very weak or pass out (faint). °· You do not feel better after 2 days. °MAKE SURE YOU: °· Understand these instructions. °· Will watch your condition. °· Will get help right away if you are not doing well or get worse. °Document Released: 09/16/2004 Document Revised: 02/08/2012 Document Reviewed: 01/27/2011 °ExitCare® Patient Information ©2015 [employer]. This information is not intended to replace advice given to you by your health care provider. Make sure you discuss any questions you have with your health care provider. ° °

## 2014-04-24 NOTE — Discharge Planning (Signed)
[facility] Community Liaison  Spoke to patient regarding primary care resources and establishing care with a provider.Patient states she is in the process of obtaining the orange card through the [facility] practice. Resource guide and my contact information provided for any future questions or concerns. No other Community Liaison needs identified at this time.

## 2014-04-24 NOTE — ED Notes (Addendum)
Pt reports to the ED for eval of abd pain, dysuria, urinary urgency, frequency, N/V, and low back pain. Pt reports she recently passed 6 kidney stones. Pt also reports fevers and chills. Pt denies any hematemesis, diarrhea, numbness, tingling, or paralysis. Pt reports she has severe pain and pressure with urination and reports she feels like she cannot fully empty her bladder. Report the abd pain is in her lower abd. Denies any vaginal d/c or bleeding. Pt A&Ox4, resp e/u, and skin warm and dry.

## 2014-04-26 LAB — URINE CULTURE: Colony Count: >=100000

## 2014-04-26 NOTE — ED Provider Notes (Signed)
CSN: [id number]     Arrival date & time 04/24/14  1053 History   First MD Initiated Contact with Patient 04/24/14 1057     Chief Complaint  Patient presents with  . Abdominal Pain     (Consider location/radiation/quality/duration/timing/severity/associated sxs/prior Treatment) Patient is a 33 y.o. female presenting with abdominal pain. The history is provided by the patient.  Abdominal Pain Pain location:  Suprapubic Pain quality: aching and sharp   Pain radiates to:  R flank and L flank Pain severity:  Moderate Onset quality:  Gradual Duration:  10 days Timing:  Constant Progression:  Worsening Chronicity:  Recurrent Relieved by:  Nothing Worsened by:  Urination Ineffective treatments:  None tried Associated symptoms: chills, dysuria, fever, nausea and vomiting   Associated symptoms: no chest pain, no cough, no diarrhea, no fatigue, no shortness of breath and no sore throat     Past Medical History  Diagnosis Date  . Asthma   . Complication of anesthesia 2007    "lost me on the table when I had upper bowel obstruction"  . PONV (postoperative nausea and vomiting)   . Chronic bronchitis     "yearly"  . Hypoglycemia     "my sugar runs low alot"  . Migraines 12/28/11    "@ least once/wk"  . Recurrent UTI   . Kidney infection     "often"  . Bowel obstruction 2007    "upper bowel"  . Arthritis     "inflammation of all my joints"  . Chronic mid back pain   . Fibromyalgia   . Anxiety   . Depression   . ADHD (attention deficit hyperactivity disorder)    Past Surgical History  Procedure Laterality Date  . Bowel resection  04/2006    "upper bowel"  . Ovary surgery  ~ 2002    "had 10 inches blood built up; they opened me up and got the blood out"   Family History  Problem Relation Age of Onset  . Breast cancer Maternal Grandmother   . Colon cancer Maternal Grandmother   . Irritable bowel syndrome Maternal Grandmother    History  Substance Use Topics  . Smoking  status: Current Every Day Smoker -- 1.00 packs/day for 16 years    Types: Cigarettes  . Smokeless tobacco: Never Used     Comment: tobacco handout given 01/31/2012  . Alcohol Use: 1.2 oz/week    2 Cans of beer per week   OB History   Grav Para Term Preterm Abortions TAB SAB Ect Mult Living                 Review of Systems  Constitutional: Positive for fever and chills. Negative for diaphoresis, activity change, appetite change and fatigue.  HENT: Negative for congestion, facial swelling, rhinorrhea and sore throat.   Eyes: Negative for photophobia and discharge.  Respiratory: Negative for cough, chest tightness and shortness of breath.   Cardiovascular: Negative for chest pain, palpitations and leg swelling.  Gastrointestinal: Positive for nausea, vomiting and abdominal pain. Negative for diarrhea.  Endocrine: Negative for polydipsia and polyuria.  Genitourinary: Positive for dysuria and frequency. Negative for difficulty urinating and pelvic pain.  Musculoskeletal: Negative for arthralgias, back pain, neck pain and neck stiffness.  Skin: Negative for color change and wound.  Allergic/Immunologic: Negative for immunocompromised state.  Neurological: Negative for facial asymmetry, weakness, numbness and headaches.  Hematological: Does not bruise/bleed easily.  Psychiatric/Behavioral: Negative for confusion and agitation.      Allergies  Ciprofloxacin; Levaquin; Morphine and related; Penicillins; Toradol; Ultram; Acetaminophen; Phenergan; Sulfa antibiotics; and Keflex  Home Medications   Prior to Admission medications   Medication Sig Start Date End Date Taking? Authorizing Provider  ibuprofen (ADVIL,MOTRIN) 200 MG tablet Take 400 mg by mouth every 6 (six) hours as needed for moderate pain.   Yes Historical Provider, MD  Omega-3 Fatty Acids (FISH OIL PO) Take 1 capsule by mouth daily.   Yes Historical Provider, MD  cefUROXime (CEFTIN) 500 MG tablet Take 1 tablet (500 mg total)  by mouth 2 (two) times daily with a meal. For 10 days 04/24/14   Toy Cookey, MD  cefUROXime (CEFTIN) 500 MG tablet Take 1 tablet (500 mg total) by mouth 2 (two) times daily with a meal. For 10 days 04/24/14   Toy Cookey, MD  ondansetron (ZOFRAN) 4 MG tablet Take 1 tablet (4 mg total) by mouth every 6 (six) hours. 04/24/14   Toy Cookey, MD  oxyCODONE (ROXICODONE) 5 MG immediate release tablet Take 1 tablet (5 mg total) by mouth every 6 (six) hours as needed for severe pain. 04/24/14   Toy Cookey, MD  oxyCODONE-acetaminophen (PERCOCET) 5-325 MG per tablet Take 1 tablet by mouth every 6 (six) hours as needed. 04/24/14   Toy Cookey, MD  promethazine (PHENERGAN) 25 MG tablet Take 1 tablet (25 mg total) by mouth every 6 (six) hours as needed for nausea or vomiting. 04/24/14   Toy Cookey, MD   BP 127/83  Pulse 90  Temp(Src) 97.7 F (36.5 C) (Oral)  Resp 16  SpO2 100% Physical Exam  Constitutional: She is oriented to person, place, and time. She appears well-developed and well-nourished. No distress.  HENT:  Head: Normocephalic and atraumatic.  Mouth/Throat: No oropharyngeal exudate.  Eyes: Pupils are equal, round, and reactive to light.  Neck: Normal range of motion. Neck supple.  Cardiovascular: Normal rate, regular rhythm and normal heart sounds.  Exam reveals no gallop and no friction rub.   No murmur heard. Pulmonary/Chest: Effort normal and breath sounds normal. No respiratory distress. She has no wheezes. She has no rales.  Abdominal: Soft. Bowel sounds are normal. She exhibits no distension and no mass. There is tenderness in the suprapubic area. There is CVA tenderness. There is no rigidity, no rebound and no guarding.  Musculoskeletal: Normal range of motion. She exhibits no edema and no tenderness.  Neurological: She is alert and oriented to person, place, and time.  Skin: Skin is warm and dry.  Psychiatric: She has a normal mood and affect.    ED Course  Procedures  (including critical care time) Labs Review Labs Reviewed  URINALYSIS, ROUTINE W REFLEX MICROSCOPIC - Abnormal; Notable for the following:    Color, Urine AMBER (*)    APPearance CLOUDY (*)    Bilirubin Urine SMALL (*)    Ketones, ur 15 (*)    Nitrite POSITIVE (*)    Leukocytes, UA SMALL (*)    All other components within normal limits  URINE MICROSCOPIC-ADD ON - Abnormal; Notable for the following:    Squamous Epithelial / LPF MANY (*)    Bacteria, UA MANY (*)    All other components within normal limits  CBC WITH DIFFERENTIAL - Abnormal; Notable for the following:    Hemoglobin 15.6 (*)    All other components within normal limits  URINE CULTURE  BASIC METABOLIC PANEL  POC URINE PREG, ED    Imaging Review No results found.   EKG Interpretation None  MDM   Final diagnoses:  Pyelonephritis    Pt is a 33 y.o. female with Pmhx as above who presents with lower abdominal pain, dysuria, frequency for about 10 days, worsening, with assoc n/v, subjective fever/chills. She has hx of Kidney stones & states she passed 6 stones about 2 weeks ago. On PE, Pt tachycardic, uncomfortable appearing, but in NAD. +suprapubic ttp. UA infected, stone study w/o acute findings. IV rocephin given, will dc home w/ PO ceftin, oxycodone for pain, zofran for n/v. Return precautions given for new or worsening symptoms including worsening pain, inability to tolerate PO.            Toy Cookey, MD 04/26/14 2115

## 2014-04-28 ENCOUNTER — Telehealth (HOSPITAL_BASED_OUTPATIENT_CLINIC_OR_DEPARTMENT_OTHER): Payer: Self-pay

## 2014-04-28 NOTE — Telephone Encounter (Signed)
Post ED Visit - Positive Culture Follow-up  Culture report reviewed by antimicrobial stewardship pharmacist:  Marlou Sa, Pharm.D., BCPS  Celedonio Miyamoto, Pharm.D., BCPS  Georgina Pillion, Pharm.D., BCPS  [place], [address].D., BCPS, AAHIVP  Estella Husk, Pharm.D., BCPS, AAHIVP  Carly Sabat, Pharm.D.  Enzo Bi, Pharm.D.  Positive Urine culture Treated with Cefuroxime, organism sensitive to the same and no further patient follow-up is required at this time.  Arvid Right 04/28/2014, 5:23 AM

## 2014-05-29 ENCOUNTER — Emergency Department (HOSPITAL_COMMUNITY)
Admission: EM | Admit: 2014-05-29 | Discharge: 2014-05-29 | Disposition: A | Discharge status: Home / Self Care | Payer: Self-pay | Attending: Emergency Medicine | Admitting: Emergency Medicine

## 2014-05-29 ENCOUNTER — Emergency Department (HOSPITAL_COMMUNITY): Payer: Self-pay

## 2014-05-29 ENCOUNTER — Encounter (HOSPITAL_COMMUNITY): Payer: Self-pay | Admitting: Emergency Medicine

## 2014-05-29 DIAGNOSIS — G43909 Migraine, unspecified, not intractable, without status migrainosus: Secondary | ICD-10-CM | POA: Insufficient documentation

## 2014-05-29 DIAGNOSIS — Z8739 Personal history of other diseases of the musculoskeletal system and connective tissue: Secondary | ICD-10-CM | POA: Insufficient documentation

## 2014-05-29 DIAGNOSIS — Z880 Allergy status to penicillin: Secondary | ICD-10-CM | POA: Insufficient documentation

## 2014-05-29 DIAGNOSIS — Z8639 Personal history of other endocrine, nutritional and metabolic disease: Secondary | ICD-10-CM | POA: Insufficient documentation

## 2014-05-29 DIAGNOSIS — J45909 Unspecified asthma, uncomplicated: Secondary | ICD-10-CM | POA: Insufficient documentation

## 2014-05-29 DIAGNOSIS — G8929 Other chronic pain: Secondary | ICD-10-CM | POA: Insufficient documentation

## 2014-05-29 DIAGNOSIS — N3090 Cystitis, unspecified without hematuria: Secondary | ICD-10-CM | POA: Insufficient documentation

## 2014-05-29 DIAGNOSIS — F329 Major depressive disorder, single episode, unspecified: Secondary | ICD-10-CM | POA: Insufficient documentation

## 2014-05-29 DIAGNOSIS — Z720 Tobacco use: Secondary | ICD-10-CM | POA: Insufficient documentation

## 2014-05-29 DIAGNOSIS — Z8719 Personal history of other diseases of the digestive system: Secondary | ICD-10-CM | POA: Insufficient documentation

## 2014-05-29 DIAGNOSIS — Z87440 Personal history of urinary (tract) infections: Secondary | ICD-10-CM | POA: Insufficient documentation

## 2014-05-29 LAB — CBC
HEMATOCRIT: 42.1 % (ref 36.0–46.0)
Hemoglobin: 14.5 g/dL (ref 12.0–15.0)
MCH: 31.7 pg (ref 26.0–34.0)
MCHC: 34.4 g/dL (ref 30.0–36.0)
MCV: 92.1 fL (ref 78.0–100.0)
Platelets: 250 10*3/uL (ref 150–400)
RBC: 4.57 MIL/uL (ref 3.87–5.11)
RDW: 13.6 % (ref 11.5–15.5)
WBC: 6.9 10*3/uL (ref 4.0–10.5)

## 2014-05-29 LAB — URINALYSIS, ROUTINE W REFLEX MICROSCOPIC
BILIRUBIN URINE: NEGATIVE
GLUCOSE, UA: NEGATIVE mg/dL
HGB URINE DIPSTICK: NEGATIVE
KETONES UR: NEGATIVE mg/dL
Leukocytes, UA: NEGATIVE
Nitrite: NEGATIVE
PH: 7 (ref 5.0–8.0)
Protein, ur: NEGATIVE mg/dL
SPECIFIC GRAVITY, URINE: 1.027 (ref 1.005–1.030)
Urobilinogen, UA: 0.2 mg/dL (ref 0.0–1.0)

## 2014-05-29 LAB — COMPREHENSIVE METABOLIC PANEL
ALT: 185 U/L — ABNORMAL HIGH (ref 0–35)
AST: 226 U/L — ABNORMAL HIGH (ref 0–37)
Albumin: 4.1 g/dL (ref 3.5–5.2)
Alkaline Phosphatase: 94 U/L (ref 39–117)
Anion gap: 10 (ref 5–15)
BUN: 12 mg/dL (ref 6–23)
CALCIUM: 9.3 mg/dL (ref 8.4–10.5)
CO2: 25 mEq/L (ref 19–32)
Chloride: 100 mEq/L (ref 96–112)
Creatinine, Ser: 0.77 mg/dL (ref 0.50–1.10)
GFR calc non Af Amer: >90 mL/min (ref >90)
Glucose, Bld: 82 mg/dL (ref 70–99)
Potassium: 4.1 mEq/L (ref 3.7–5.3)
Sodium: 135 mEq/L — ABNORMAL LOW (ref 137–147)
TOTAL PROTEIN: 7.7 g/dL (ref 6.0–8.3)
Total Bilirubin: 0.6 mg/dL (ref 0.3–1.2)

## 2014-05-29 LAB — LIPASE, BLOOD: Lipase: 30 U/L (ref 11–59)

## 2014-05-29 LAB — POC URINE PREG, ED: PREG TEST UR: NEGATIVE

## 2014-05-29 MED ORDER — SODIUM CHLORIDE 0.9 % IV BOLUS (SEPSIS)
1000.0000 mL | Freq: Once | INTRAVENOUS | Status: AC
  Administered 2014-05-29: 1000 mL via INTRAVENOUS

## 2014-05-29 MED ORDER — ONDANSETRON HCL 4 MG PO TABS: 4.0000 mg | ORAL_TABLET | Freq: Four times a day (QID) | ORAL | Status: DC

## 2014-05-29 MED ORDER — FENTANYL CITRATE 0.05 MG/ML IJ SOLN
50.0000 ug | Freq: Once | INTRAMUSCULAR | Status: AC
  Administered 2014-05-29: 50 ug via INTRAVENOUS
  Filled 2014-05-29: qty 2

## 2014-05-29 MED ORDER — IOHEXOL 300 MG/ML  SOLN
80.0000 mL | Freq: Once | INTRAMUSCULAR | Status: AC | PRN
  Administered 2014-05-29: 80 mL via INTRAVENOUS

## 2014-05-29 MED ORDER — IOHEXOL 300 MG/ML  SOLN
25.0000 mL | Freq: Once | INTRAMUSCULAR | Status: AC | PRN
  Administered 2014-05-29: 25 mL via ORAL

## 2014-05-29 MED ORDER — ONDANSETRON HCL 4 MG/2ML IJ SOLN
4.0000 mg | Freq: Once | INTRAMUSCULAR | Status: AC
Start: 2014-05-29 — End: 2014-05-29
  Administered 2014-05-29: 4 mg via INTRAVENOUS
  Filled 2014-05-29: qty 2

## 2014-05-29 MED ORDER — OXYCODONE HCL 5 MG PO CAPS: 5.0000 mg | ORAL_CAPSULE | ORAL | Status: DC | PRN

## 2014-05-29 MED ORDER — HYDROMORPHONE HCL 1 MG/ML IJ SOLN
1.0000 mg | Freq: Once | INTRAMUSCULAR | Status: AC
  Administered 2014-05-29: 1 mg via INTRAVENOUS
  Filled 2014-05-29: qty 1

## 2014-05-29 MED ORDER — ONDANSETRON HCL 4 MG/2ML IJ SOLN
4.0000 mg | Freq: Once | INTRAMUSCULAR | Status: AC
  Administered 2014-05-29: 4 mg via INTRAVENOUS
  Filled 2014-05-29: qty 2

## 2014-05-29 NOTE — ED Notes (Signed)
Ct called and made aware pt has finished po contrast

## 2014-05-29 NOTE — ED Notes (Signed)
Pt reports having severe kidney infection. Having pain with urination, foul smelling urine, n/v and fever x 3 days.

## 2014-05-29 NOTE — ED Notes (Signed)
Pt removed her own IV. Stated "It's ok I knew what I was doing, I'm a CNA". Pt had gauze and tape dressing over site. No longer bleeding.

## 2014-05-29 NOTE — ED Provider Notes (Signed)
Patient to the ER and seen by Dr. Mingo Amber for concern of kidney infection. She has a history of frequent UTI's and we worry about possible pyelo due to severity of symptoms.  Results for orders placed during the hospital encounter of 05/29/14  URINALYSIS, ROUTINE W REFLEX MICROSCOPIC      Result Value Ref Range   Color, Urine YELLOW  YELLOW   APPearance CLOUDY (*) CLEAR   Specific Gravity, Urine 1.027  1.005 - 1.030   pH 7.0  5.0 - 8.0   Glucose, UA NEGATIVE  NEGATIVE mg/dL   Hgb urine dipstick NEGATIVE  NEGATIVE   Bilirubin Urine NEGATIVE  NEGATIVE   Ketones, ur NEGATIVE  NEGATIVE mg/dL   Protein, ur NEGATIVE  NEGATIVE mg/dL   Urobilinogen, UA 0.2  0.0 - 1.0 mg/dL   Nitrite NEGATIVE  NEGATIVE   Leukocytes, UA NEGATIVE  NEGATIVE  CBC      Result Value Ref Range   WBC 6.9  4.0 - 10.5 K/uL   RBC 4.57  3.87 - 5.11 MIL/uL   Hemoglobin 14.5  12.0 - 15.0 g/dL   HCT 42.1  36.0 - 46.0 %   MCV 92.1  78.0 - 100.0 fL   MCH 31.7  26.0 - 34.0 pg   MCHC 34.4  30.0 - 36.0 g/dL   RDW 13.6  11.5 - 15.5 %   Platelets 250  150 - 400 K/uL  COMPREHENSIVE METABOLIC PANEL      Result Value Ref Range   Sodium 135 (*) 137 - 147 mEq/L   Potassium 4.1  3.7 - 5.3 mEq/L   Chloride 100  96 - 112 mEq/L   CO2 25  19 - 32 mEq/L   Glucose, Bld 82  70 - 99 mg/dL   BUN 12  6 - 23 mg/dL   Creatinine, Ser 0.77  0.50 - 1.10 mg/dL   Calcium 9.3  8.4 - 10.5 mg/dL   Total Protein 7.7  6.0 - 8.3 g/dL   Albumin 4.1  3.5 - 5.2 g/dL   AST 226 (*) 0 - 37 U/L   ALT 185 (*) 0 - 35 U/L   Alkaline Phosphatase 94  39 - 117 U/L   Total Bilirubin 0.6  0.3 - 1.2 mg/dL   GFR calc non Af Amer >90  >90 mL/min   GFR calc Af Amer >90  >90 mL/min   Anion gap 10  5 - 15  LIPASE, BLOOD      Result Value Ref Range   Lipase 30  11 - 59 U/L  POC URINE PREG, ED      Result Value Ref Range   Preg Test, Ur NEGATIVE  NEGATIVE   Ct Abdomen Pelvis W Contrast  05/29/2014   CLINICAL DATA:  Bilateral costovertebral angle tenderness with  diffuse abdominal tenderness and fever history of urinary tract infections  EXAM: CT ABDOMEN AND PELVIS WITH CONTRAST  TECHNIQUE: Multidetector CT imaging of the abdomen and pelvis was performed using the standard protocol following bolus administration of intravenous contrast.  CONTRAST:  13m OMNIPAQUE IOHEXOL 300 MG/ML  SOLN  COMPARISON:  Noncontrast CT scan of the abdomen and pelvis dated April 24, 2014.  FINDINGS: The kidneys enhance well. There is no evidence of obstruction or calcified stones. The perinephric fat is normal. Along the course of the ureters no stones are evident. The partially distended urinary bladder is unremarkable.  The liver, gallbladder, pancreas, spleen, adrenal glands, and abdominal aorta are normal. The periaortic and pericaval regions  are normal. The small and large bowel exhibit no evidence of ileus nor of obstruction. The appendix is normal. The uterus and adnexal structures are unremarkable. There is no free abdominal or pelvic fluid. There is no inguinal nor significant umbilical hernia.  The lung bases are clear. The lumbar spine and bony pelvis are unremarkable.  IMPRESSION: 1. There is no evidence of urinary tract obstruction, calcified stones, or pyelonephritis. There is no acute bladder abnormality. Cystitis could be present and be radiographically inapparent. 2. There is no acute hepatic biliary, bowel, or gynecological abnormality. 3. There is no intra-abdominal or pelvic lymphadenopathy, abscess, or free fluid.   Electronically Signed   By: David  Martinique   On: 05/29/2014 17:20      The plans are to review labs and CT abdomen to r/o pyelo or any other severe intraabdominal abnormality.  Her urine, lipase, urine preg, comp met, cbc are without any significant abnormalities.   Medications  iohexol (OMNIPAQUE) 300 MG/ML solution 25 mL (25 mLs Oral Contrast Given 05/29/14 1406)  ondansetron (ZOFRAN) injection 4 mg (4 mg Intravenous Given 05/29/14 1458)   HYDROmorphone (DILAUDID) injection 1 mg (1 mg Intravenous Given 05/29/14 1458)  sodium chloride 0.9 % bolus 1,000 mL (0 mLs Intravenous Stopped 05/29/14 1706)  iohexol (OMNIPAQUE) 300 MG/ML solution 80 mL (80 mLs Intravenous Contrast Given 05/29/14 1701)  fentaNYL (SUBLIMAZE) injection 50 mcg (50 mcg Intravenous Given 05/29/14 1825)  ondansetron (ZOFRAN) injection 4 mg (4 mg Intravenous Given 05/29/14 1825)    Her pain was managed in the ED but she is now having severe pain again. Will give a dose of IV medication in the ED. She also advises me to look at her pain medication list and give her rx for pain medications. CT did now poss concern for interstitial cystitis. Will give her referral tor Urology and a small rx for pain medications.  33 y.o.Amy Walls's evaluation in the Emergency Department is complete. It has been determined that no acute conditions requiring further emergency intervention are present at this time. The patient/guardian have been advised of the diagnosis and plan. We have discussed signs and symptoms that warrant return to the ED, such as changes or worsening in symptoms.  Vital signs are stable at discharge. Filed Vitals:   05/29/14 1822  BP: 104/65  Pulse: 55  Temp:   Resp: 16    Patient/guardian has voiced understanding and agreed to follow-up with the PCP or specialist.    Linus Mako, PA-C 05/29/14 1849

## 2014-05-29 NOTE — Discharge Instructions (Signed)

## 2014-05-29 NOTE — ED Provider Notes (Signed)
I was available for consultation during the completion of this patient's care.  Please see the initial providers' notes for the course.  Gerhard Munchobert Brittane Grudzinski, MD 05/29/14 2044

## 2014-05-29 NOTE — ED Provider Notes (Signed)
CSN: [id number]     Arrival date & time 05/29/14  1049 History   First MD Initiated Contact with Patient 05/29/14 1325     Chief Complaint  Patient presents with  . Emesis  . Fever     (Consider location/radiation/quality/duration/timing/severity/associated sxs/prior Treatment) Patient is a 33 y.o. female presenting with abdominal pain. The history is provided by the patient.  Abdominal Pain Pain location:  Generalized Pain quality: aching and sharp   Pain radiates to:  Does not radiate Pain severity:  Moderate Onset quality:  Gradual Timing:  Constant Progression:  Worsening Associated symptoms: fever (101.4 at home) and vomiting   Associated symptoms: no cough, no diarrhea and no shortness of breath     Past Medical History  Diagnosis Date  . Asthma   . Complication of anesthesia 2007    "lost me on the table when I had upper bowel obstruction"  . PONV (postoperative nausea and vomiting)   . Chronic bronchitis     "yearly"  . Hypoglycemia     "my sugar runs low alot"  . Migraines 12/28/11    "@ least once/wk"  . Recurrent UTI   . Kidney infection     "often"  . Bowel obstruction 2007    "upper bowel"  . Arthritis     "inflammation of all my joints"  . Chronic mid back pain   . Fibromyalgia   . Anxiety   . Depression   . ADHD (attention deficit hyperactivity disorder)    Past Surgical History  Procedure Laterality Date  . Bowel resection  04/2006    "upper bowel"  . Ovary surgery  ~ 2002    "had 10 inches blood built up; they opened me up and got the blood out"   Family History  Problem Relation Age of Onset  . Breast cancer Maternal Grandmother   . Colon cancer Maternal Grandmother   . Irritable bowel syndrome Maternal Grandmother    History  Substance Use Topics  . Smoking status: Current Every Day Smoker -- 1.00 packs/day for 16 years    Types: Cigarettes  . Smokeless tobacco: Never Used     Comment: tobacco handout given 01/31/2012  . Alcohol Use:  1.2 oz/week    2 Cans of beer per week   OB History   Grav Para Term Preterm Abortions TAB SAB Ect Mult Living                 Review of Systems  Constitutional: Positive for fever (101.4 at home).  Respiratory: Negative for cough and shortness of breath.   Gastrointestinal: Positive for vomiting and abdominal pain. Negative for diarrhea.  All other systems reviewed and are negative.     Allergies  Ciprofloxacin; Levaquin; Morphine and related; Penicillins; Toradol; Ultram; Acetaminophen; Phenergan; Sulfa antibiotics; and Keflex  Home Medications   Prior to Admission medications   Medication Sig Start Date End Date Taking? Authorizing Provider  gabapentin (NEURONTIN) 600 MG tablet Take 600 mg by mouth 2 (two) times daily.   Yes Historical Provider, MD  Omega-3 Fatty Acids (FISH OIL PO) Take 1 capsule by mouth daily.   Yes Historical Provider, MD   BP 97/74  Pulse 54  Temp(Src) 98.2 F (36.8 C) (Oral)  Resp 18  SpO2 100% Physical Exam  Nursing note and vitals reviewed. Constitutional: She is oriented to person, place, and time. She appears well-developed and well-nourished. No distress.  HENT:  Head: Normocephalic and atraumatic.  Mouth/Throat: Oropharynx is clear and  moist.  Eyes: EOM are normal. Pupils are equal, round, and reactive to light.  Neck: Normal range of motion. Neck supple.  Cardiovascular: Normal rate and regular rhythm.  Exam reveals no friction rub.   No murmur heard. Pulmonary/Chest: Effort normal and breath sounds normal. No respiratory distress. She has no wheezes. She has no rales.  Abdominal: Soft. She exhibits no distension. There is tenderness (diffuse, moderate bilateral CVA tenderness). There is no rebound.  Musculoskeletal: Normal range of motion. She exhibits no edema.  Neurological: She is alert and oriented to person, place, and time.  Skin: She is not diaphoretic.    ED Course  Procedures (including critical care time) Labs  Review Labs Reviewed  URINALYSIS, ROUTINE W REFLEX MICROSCOPIC - Abnormal; Notable for the following:    APPearance CLOUDY (*)    All other components within normal limits  COMPREHENSIVE METABOLIC PANEL - Abnormal; Notable for the following:    Sodium 135 (*)    AST 226 (*)    ALT 185 (*)    All other components within normal limits  CBC  LIPASE, BLOOD  POC URINE PREG, ED    Imaging Review No results found.   EKG Interpretation None      MDM   Final diagnoses:  None    50F with abdominal pain, vomiting, fevers for past few days. Tmax of 101.4. Having some dysuria, foul smelling urine, however UA negative.  Diffuse abdominal pain, bilateral CVA tenderness. Will CT. Labs ok, show mild transaminitis, similar to prior. CT pending, checked out to [facility].    Elwin Mocha, MD 05/31/14 [phone]

## 2014-08-17 ENCOUNTER — Encounter (HOSPITAL_COMMUNITY): Payer: Self-pay | Admitting: Emergency Medicine

## 2014-08-17 ENCOUNTER — Emergency Department (HOSPITAL_COMMUNITY)
Admission: EM | Admit: 2014-08-17 | Discharge: 2014-08-17 | Disposition: A | Discharge status: Home / Self Care | Payer: Self-pay | Attending: Emergency Medicine | Admitting: Emergency Medicine

## 2014-08-17 DIAGNOSIS — Z8719 Personal history of other diseases of the digestive system: Secondary | ICD-10-CM | POA: Insufficient documentation

## 2014-08-17 DIAGNOSIS — Z3202 Encounter for pregnancy test, result negative: Secondary | ICD-10-CM | POA: Insufficient documentation

## 2014-08-17 DIAGNOSIS — Z880 Allergy status to penicillin: Secondary | ICD-10-CM | POA: Insufficient documentation

## 2014-08-17 DIAGNOSIS — Z79899 Other long term (current) drug therapy: Secondary | ICD-10-CM | POA: Insufficient documentation

## 2014-08-17 DIAGNOSIS — Z793 Long term (current) use of hormonal contraceptives: Secondary | ICD-10-CM | POA: Insufficient documentation

## 2014-08-17 DIAGNOSIS — J45909 Unspecified asthma, uncomplicated: Secondary | ICD-10-CM | POA: Insufficient documentation

## 2014-08-17 DIAGNOSIS — F419 Anxiety disorder, unspecified: Secondary | ICD-10-CM | POA: Insufficient documentation

## 2014-08-17 DIAGNOSIS — N390 Urinary tract infection, site not specified: Secondary | ICD-10-CM | POA: Insufficient documentation

## 2014-08-17 DIAGNOSIS — G8929 Other chronic pain: Secondary | ICD-10-CM | POA: Insufficient documentation

## 2014-08-17 DIAGNOSIS — Z8639 Personal history of other endocrine, nutritional and metabolic disease: Secondary | ICD-10-CM | POA: Insufficient documentation

## 2014-08-17 DIAGNOSIS — M1990 Unspecified osteoarthritis, unspecified site: Secondary | ICD-10-CM | POA: Insufficient documentation

## 2014-08-17 DIAGNOSIS — Z79891 Long term (current) use of opiate analgesic: Secondary | ICD-10-CM | POA: Insufficient documentation

## 2014-08-17 DIAGNOSIS — Z720 Tobacco use: Secondary | ICD-10-CM | POA: Insufficient documentation

## 2014-08-17 DIAGNOSIS — Z8619 Personal history of other infectious and parasitic diseases: Secondary | ICD-10-CM | POA: Insufficient documentation

## 2014-08-17 LAB — CBC WITH DIFFERENTIAL/PLATELET
Basophils Absolute: 0 K/uL (ref 0.0–0.1)
Basophils Relative: 0 % (ref 0–1)
Eosinophils Absolute: 0.1 K/uL (ref 0.0–0.7)
Eosinophils Relative: 1 % (ref 0–5)
HCT: 42.5 % (ref 36.0–46.0)
Hemoglobin: 14.6 g/dL (ref 12.0–15.0)
Lymphocytes Relative: 38 % (ref 12–46)
Lymphs Abs: 3.4 K/uL (ref 0.7–4.0)
MCH: 31.8 pg (ref 26.0–34.0)
MCHC: 34.4 g/dL (ref 30.0–36.0)
MCV: 92.6 fL (ref 78.0–100.0)
Monocytes Absolute: 0.8 K/uL (ref 0.1–1.0)
Monocytes Relative: 9 % (ref 3–12)
Neutro Abs: 4.5 K/uL (ref 1.7–7.7)
Neutrophils Relative %: 52 % (ref 43–77)
Platelets: 294 K/uL (ref 150–400)
RBC: 4.59 MIL/uL (ref 3.87–5.11)
RDW: 12.3 % (ref 11.5–15.5)
WBC: 8.8 K/uL (ref 4.0–10.5)

## 2014-08-17 LAB — COMPREHENSIVE METABOLIC PANEL WITH GFR
ALT: 122 U/L — ABNORMAL HIGH (ref 0–35)
AST: 149 U/L — ABNORMAL HIGH (ref 0–37)
Albumin: 4 g/dL (ref 3.5–5.2)
Alkaline Phosphatase: 92 U/L (ref 39–117)
Anion gap: 9 (ref 5–15)
BUN: 10 mg/dL (ref 6–23)
CO2: 24 mmol/L (ref 19–32)
Calcium: 9.6 mg/dL (ref 8.4–10.5)
Chloride: 104 meq/L (ref 96–112)
Creatinine, Ser: 0.77 mg/dL (ref 0.50–1.10)
GFR calc Af Amer: >90 mL/min (ref >90)
GFR calc non Af Amer: >90 mL/min (ref >90)
Glucose, Bld: 103 mg/dL — ABNORMAL HIGH (ref 70–99)
Potassium: 3.9 mmol/L (ref 3.5–5.1)
Sodium: 137 mmol/L (ref 135–145)
Total Bilirubin: 0.7 mg/dL (ref 0.3–1.2)
Total Protein: 7.5 g/dL (ref 6.0–8.3)

## 2014-08-17 LAB — URINALYSIS, ROUTINE W REFLEX MICROSCOPIC
Glucose, UA: NEGATIVE mg/dL
HGB URINE DIPSTICK: NEGATIVE
Ketones, ur: 15 mg/dL — AB
Nitrite: POSITIVE — AB
PH: 5.5 (ref 5.0–8.0)
Protein, ur: NEGATIVE mg/dL
SPECIFIC GRAVITY, URINE: 1.029 (ref 1.005–1.030)
Urobilinogen, UA: 0.2 mg/dL (ref 0.0–1.0)

## 2014-08-17 LAB — URINE MICROSCOPIC-ADD ON

## 2014-08-17 LAB — POC URINE PREG, ED: Preg Test, Ur: NEGATIVE

## 2014-08-17 LAB — LIPASE, BLOOD: Lipase: 33 U/L (ref 11–59)

## 2014-08-17 MED ORDER — HYDROMORPHONE HCL 1 MG/ML IJ SOLN
1.0000 mg | Freq: Once | INTRAMUSCULAR | Status: AC
  Administered 2014-08-17: 1 mg via INTRAVENOUS
  Filled 2014-08-17: qty 1

## 2014-08-17 MED ORDER — ONDANSETRON HCL 4 MG/2ML IJ SOLN
4.0000 mg | Freq: Once | INTRAMUSCULAR | Status: AC
  Administered 2014-08-17: 4 mg via INTRAVENOUS
  Filled 2014-08-17: qty 2

## 2014-08-17 MED ORDER — NITROFURANTOIN MONOHYD MACRO 100 MG PO CAPS: 100.0000 mg | ORAL_CAPSULE | Freq: Two times a day (BID) | ORAL | Status: DC

## 2014-08-17 MED ORDER — SODIUM CHLORIDE 0.9 % IV BOLUS (SEPSIS)
500.0000 mL | Freq: Once | INTRAVENOUS | Status: AC
  Administered 2014-08-17: 500 mL via INTRAVENOUS

## 2014-08-17 MED ORDER — METOCLOPRAMIDE HCL 5 MG/ML IJ SOLN
10.0000 mg | Freq: Once | INTRAMUSCULAR | Status: AC
  Administered 2014-08-17: 10 mg via INTRAVENOUS
  Filled 2014-08-17: qty 2

## 2014-08-17 NOTE — Discharge Instructions (Signed)

## 2014-08-17 NOTE — ED Notes (Signed)
Dr Blinda LeatherwoodPollina notified x 2 of pts request for pain medication.

## 2014-08-17 NOTE — ED Notes (Signed)
Patient states that she has Hep C and having a "liver flare up".  Patient is having abdominal pain, vomiting and not able to keep food or liquid down.

## 2014-08-17 NOTE — ED Provider Notes (Addendum)
CSN: [id number]     Arrival date & time 08/17/14  [id number] History   First MD Initiated Contact with Patient 08/17/14 0701     Chief Complaint  Patient presents with  . Abdominal Pain     (Consider location/radiation/quality/duration/timing/severity/associated sxs/prior Treatment) HPI Comments: Patient presents for evaluation of right flank pain. Patient reports that symptoms began overnight. She has had nausea and vomiting. Patient reports constant severe pain in the right lower back and flank area. Patient reports that she has hepatitis C and is concerned that the pain and vomiting are secondary to a "liver flareup".  Patient is a 33 y.o. female presenting with abdominal pain.  Abdominal Pain Associated symptoms: nausea and vomiting     Past Medical History  Diagnosis Date  . Asthma   . Complication of anesthesia 2007    "lost me on the table when I had upper bowel obstruction"  . PONV (postoperative nausea and vomiting)   . Chronic bronchitis     "yearly"  . Hypoglycemia     "my sugar runs low alot"  . Migraines 12/28/11    "@ least once/wk"  . Recurrent UTI   . Kidney infection     "often"  . Bowel obstruction 2007    "upper bowel"  . Arthritis     "inflammation of all my joints"  . Chronic mid back pain   . Fibromyalgia   . Anxiety   . Depression   . ADHD (attention deficit hyperactivity disorder)    Past Surgical History  Procedure Laterality Date  . Bowel resection  04/2006    "upper bowel"  . Ovary surgery  ~ 2002    "had 10 inches blood built up; they opened me up and got the blood out"   Family History  Problem Relation Age of Onset  . Breast cancer Maternal Grandmother   . Colon cancer Maternal Grandmother   . Irritable bowel syndrome Maternal Grandmother    History  Substance Use Topics  . Smoking status: Current Every Day Smoker -- 1.00 packs/day for 16 years    Types: Cigarettes  . Smokeless tobacco: Never Used     Comment: tobacco handout given  01/31/2012  . Alcohol Use: 1.2 oz/week    2 Cans of beer per week   OB History    No data available     Review of Systems  Gastrointestinal: Positive for nausea, vomiting and abdominal pain.  Genitourinary: Positive for flank pain.  All other systems reviewed and are negative.     Allergies  Ciprofloxacin; Levaquin; Morphine and related; Penicillins; Toradol; Ultram; Acetaminophen; Phenergan; Sulfa antibiotics; and Keflex  Home Medications   Prior to Admission medications   Medication Sig Start Date End Date Taking? Authorizing Provider  etonogestrel (IMPLANON) 68 MG IMPL implant 1 each by Subdermal route once.   Yes Historical Provider, MD  gabapentin (NEURONTIN) 600 MG tablet Take 600 mg by mouth 2 (two) times daily.   Yes Historical Provider, MD  Omega-3 Fatty Acids (FISH OIL PO) Take 1 capsule by mouth daily.   Yes Historical Provider, MD  oxycodone (OXY-IR) 5 MG capsule Take 1 capsule (5 mg total) by mouth every 4 (four) hours as needed. Patient taking differently: Take 5 mg by mouth every 4 (four) hours as needed for pain.  05/29/14  Yes Tiffany Irine SealG Greene, PA-C  ondansetron (ZOFRAN) 4 MG tablet Take 1 tablet (4 mg total) by mouth every 6 (six) hours. Patient not taking: Reported on 08/17/2014 05/29/14  Dorthula Matasiffany G Greene, PA-C   BP 112/62 mmHg  Pulse 69  Temp(Src) 98 F (36.7 C) (Oral)  Resp 11  SpO2 97% Physical Exam  Constitutional: She is oriented to person, place, and time. She appears well-developed and well-nourished. No distress.  HENT:  Head: Normocephalic and atraumatic.  Right Ear: Hearing normal.  Left Ear: Hearing normal.  Nose: Nose normal.  Mouth/Throat: Oropharynx is clear and moist and mucous membranes are normal.  Eyes: Conjunctivae and EOM are normal. Pupils are equal, round, and reactive to light.  Neck: Normal range of motion. Neck supple.  Cardiovascular: Regular rhythm, S1 normal and S2 normal.  Exam reveals no gallop and no friction rub.   No  murmur heard. Pulmonary/Chest: Effort normal and breath sounds normal. No respiratory distress. She exhibits no tenderness.  Abdominal: Soft. Normal appearance and bowel sounds are normal. There is no hepatosplenomegaly. There is no tenderness. There is no rebound, no guarding, no tenderness at McBurney's point and negative Murphy's sign. No hernia.  Musculoskeletal: Normal range of motion.       Lumbar back: She exhibits tenderness.       Back:  Neurological: She is alert and oriented to person, place, and time. She has normal strength. No cranial nerve deficit or sensory deficit. Coordination normal. GCS eye subscore is 4. GCS verbal subscore is 5. GCS motor subscore is 6.  Skin: Skin is warm, dry and intact. No rash noted. No cyanosis.  Psychiatric: She has a normal mood and affect. Her speech is normal and behavior is normal. Thought content normal.  Nursing note and vitals reviewed.   ED Course  Procedures (including critical care time) Labs Review Labs Reviewed  COMPREHENSIVE METABOLIC PANEL - Abnormal; Notable for the following:    Glucose, Bld 103 (*)    AST 149 (*)    ALT 122 (*)    All other components within normal limits  URINALYSIS, ROUTINE W REFLEX MICROSCOPIC - Abnormal; Notable for the following:    Color, Urine AMBER (*)    APPearance CLOUDY (*)    Bilirubin Urine SMALL (*)    Ketones, ur 15 (*)    Nitrite POSITIVE (*)    Leukocytes, UA TRACE (*)    All other components within normal limits  URINE MICROSCOPIC-ADD ON - Abnormal; Notable for the following:    Squamous Epithelial / LPF MANY (*)    Bacteria, UA MANY (*)    All other components within normal limits  CBC WITH DIFFERENTIAL  LIPASE, BLOOD  POC URINE PREG, ED    Imaging Review No results found.   EKG Interpretation None      MDM   Final diagnoses:  None   UTI  Patient presents to the ER for evaluation of low back discomfort. Patient initially reported that she thought it was secondary to  her liver, but there is nothing to support this. Patient does have a history of hepatitis C with mild LFT abnormalities, but they are actually improved from her baseline. She is not having anterior abdominal pain and is not tender over the right upper quadrant. Blood work was otherwise unremarkable. Urinalysis does suggest infection. I suspect her symptoms are secondary to urinary tract infection. She will be treated with Macrobid.    Gilda Creasehristopher J. Briyanna Billingham, MD 08/17/14 1010  Gilda Creasehristopher J. Rexanne Inocencio, MD 08/17/14 1019

## 2014-08-29 ENCOUNTER — Observation Stay (HOSPITAL_COMMUNITY)
Admission: EM | Admit: 2014-08-29 | Discharge: 2014-08-31 | Disposition: A | Discharge status: Home / Self Care | Payer: Self-pay | Attending: Internal Medicine | Admitting: Internal Medicine

## 2014-08-29 ENCOUNTER — Emergency Department (HOSPITAL_COMMUNITY): Payer: Self-pay

## 2014-08-29 ENCOUNTER — Encounter (HOSPITAL_COMMUNITY): Payer: Self-pay | Admitting: Emergency Medicine

## 2014-08-29 DIAGNOSIS — K5900 Constipation, unspecified: Secondary | ICD-10-CM

## 2014-08-29 DIAGNOSIS — G43A1 Cyclical vomiting, intractable: Secondary | ICD-10-CM

## 2014-08-29 DIAGNOSIS — M1990 Unspecified osteoarthritis, unspecified site: Secondary | ICD-10-CM | POA: Insufficient documentation

## 2014-08-29 DIAGNOSIS — F329 Major depressive disorder, single episode, unspecified: Secondary | ICD-10-CM | POA: Insufficient documentation

## 2014-08-29 DIAGNOSIS — Z881 Allergy status to other antibiotic agents status: Secondary | ICD-10-CM | POA: Insufficient documentation

## 2014-08-29 DIAGNOSIS — J45909 Unspecified asthma, uncomplicated: Secondary | ICD-10-CM | POA: Insufficient documentation

## 2014-08-29 DIAGNOSIS — Z880 Allergy status to penicillin: Secondary | ICD-10-CM | POA: Insufficient documentation

## 2014-08-29 DIAGNOSIS — R1110 Vomiting, unspecified: Secondary | ICD-10-CM | POA: Diagnosis present

## 2014-08-29 DIAGNOSIS — Z885 Allergy status to narcotic agent status: Secondary | ICD-10-CM | POA: Insufficient documentation

## 2014-08-29 DIAGNOSIS — R1084 Generalized abdominal pain: Secondary | ICD-10-CM | POA: Insufficient documentation

## 2014-08-29 DIAGNOSIS — B1920 Unspecified viral hepatitis C without hepatic coma: Secondary | ICD-10-CM | POA: Insufficient documentation

## 2014-08-29 DIAGNOSIS — K589 Irritable bowel syndrome without diarrhea: Secondary | ICD-10-CM | POA: Insufficient documentation

## 2014-08-29 DIAGNOSIS — R109 Unspecified abdominal pain: Secondary | ICD-10-CM | POA: Diagnosis present

## 2014-08-29 DIAGNOSIS — R110 Nausea: Secondary | ICD-10-CM

## 2014-08-29 DIAGNOSIS — F17210 Nicotine dependence, cigarettes, uncomplicated: Secondary | ICD-10-CM | POA: Insufficient documentation

## 2014-08-29 DIAGNOSIS — R740 Nonspecific elevation of levels of transaminase and lactic acid dehydrogenase [LDH]: Secondary | ICD-10-CM

## 2014-08-29 DIAGNOSIS — F419 Anxiety disorder, unspecified: Secondary | ICD-10-CM | POA: Insufficient documentation

## 2014-08-29 DIAGNOSIS — R112 Nausea with vomiting, unspecified: Secondary | ICD-10-CM

## 2014-08-29 DIAGNOSIS — G43909 Migraine, unspecified, not intractable, without status migrainosus: Secondary | ICD-10-CM | POA: Insufficient documentation

## 2014-08-29 DIAGNOSIS — Z882 Allergy status to sulfonamides status: Secondary | ICD-10-CM | POA: Insufficient documentation

## 2014-08-29 DIAGNOSIS — R7401 Elevation of levels of liver transaminase levels: Secondary | ICD-10-CM | POA: Diagnosis present

## 2014-08-29 DIAGNOSIS — R197 Diarrhea, unspecified: Secondary | ICD-10-CM

## 2014-08-29 DIAGNOSIS — Z79899 Other long term (current) drug therapy: Secondary | ICD-10-CM | POA: Insufficient documentation

## 2014-08-29 DIAGNOSIS — Z886 Allergy status to analgesic agent status: Secondary | ICD-10-CM | POA: Insufficient documentation

## 2014-08-29 DIAGNOSIS — M797 Fibromyalgia: Secondary | ICD-10-CM | POA: Insufficient documentation

## 2014-08-29 DIAGNOSIS — F1910 Other psychoactive substance abuse, uncomplicated: Secondary | ICD-10-CM

## 2014-08-29 DIAGNOSIS — R159 Full incontinence of feces: Principal | ICD-10-CM | POA: Insufficient documentation

## 2014-08-29 DIAGNOSIS — F909 Attention-deficit hyperactivity disorder, unspecified type: Secondary | ICD-10-CM | POA: Insufficient documentation

## 2014-08-29 DIAGNOSIS — Z87440 Personal history of urinary (tract) infections: Secondary | ICD-10-CM | POA: Insufficient documentation

## 2014-08-29 LAB — URINE MICROSCOPIC-ADD ON

## 2014-08-29 LAB — URINALYSIS, ROUTINE W REFLEX MICROSCOPIC
Glucose, UA: NEGATIVE mg/dL
Hgb urine dipstick: NEGATIVE
NITRITE: NEGATIVE
Protein, ur: NEGATIVE mg/dL
Specific Gravity, Urine: 1.029 (ref 1.005–1.030)
UROBILINOGEN UA: 1 mg/dL (ref 0.0–1.0)
pH: 6 (ref 5.0–8.0)

## 2014-08-29 LAB — CBC WITH DIFFERENTIAL/PLATELET
Basophils Absolute: 0 10*3/uL (ref 0.0–0.1)
Basophils Relative: 0 % (ref 0–1)
EOS ABS: 0 10*3/uL (ref 0.0–0.7)
EOS PCT: 0 % (ref 0–5)
HEMATOCRIT: 44.6 % (ref 36.0–46.0)
Hemoglobin: 15.4 g/dL — ABNORMAL HIGH (ref 12.0–15.0)
Lymphocytes Relative: 19 % (ref 12–46)
Lymphs Abs: 2.4 10*3/uL (ref 0.7–4.0)
MCH: 31.9 pg (ref 26.0–34.0)
MCHC: 34.5 g/dL (ref 30.0–36.0)
MCV: 92.3 fL (ref 78.0–100.0)
MONO ABS: 0.6 10*3/uL (ref 0.1–1.0)
Monocytes Relative: 5 % (ref 3–12)
Neutro Abs: 9.6 10*3/uL — ABNORMAL HIGH (ref 1.7–7.7)
Neutrophils Relative %: 76 % (ref 43–77)
Platelets: 311 10*3/uL (ref 150–400)
RBC: 4.83 MIL/uL (ref 3.87–5.11)
RDW: 12 % (ref 11.5–15.5)
WBC: 12.6 10*3/uL — ABNORMAL HIGH (ref 4.0–10.5)

## 2014-08-29 LAB — COMPREHENSIVE METABOLIC PANEL
ALK PHOS: 103 U/L (ref 39–117)
ALT: 158 U/L — ABNORMAL HIGH (ref 0–35)
ANION GAP: 10 (ref 5–15)
AST: 191 U/L — ABNORMAL HIGH (ref 0–37)
Albumin: 4 g/dL (ref 3.5–5.2)
BILIRUBIN TOTAL: 0.6 mg/dL (ref 0.3–1.2)
BUN: 16 mg/dL (ref 6–23)
CO2: 26 mmol/L (ref 19–32)
Calcium: 9.3 mg/dL (ref 8.4–10.5)
Chloride: 98 mEq/L (ref 96–112)
Creatinine, Ser: 0.79 mg/dL (ref 0.50–1.10)
GFR calc Af Amer: >90 mL/min (ref >90)
GFR calc non Af Amer: >90 mL/min (ref >90)
Glucose, Bld: 100 mg/dL — ABNORMAL HIGH (ref 70–99)
POTASSIUM: 3.9 mmol/L (ref 3.5–5.1)
Sodium: 134 mmol/L — ABNORMAL LOW (ref 135–145)
TOTAL PROTEIN: 7.4 g/dL (ref 6.0–8.3)

## 2014-08-29 LAB — RAPID URINE DRUG SCREEN, HOSP PERFORMED
Amphetamines: NOT DETECTED
BARBITURATES: NOT DETECTED
BENZODIAZEPINES: NOT DETECTED
Cocaine: NOT DETECTED
Opiates: POSITIVE — AB
TETRAHYDROCANNABINOL: NOT DETECTED

## 2014-08-29 LAB — POC OCCULT BLOOD, ED: FECAL OCCULT BLD: NEGATIVE

## 2014-08-29 LAB — LIPASE, BLOOD: Lipase: 26 U/L (ref 11–59)

## 2014-08-29 LAB — MRSA PCR SCREENING: MRSA by PCR: NEGATIVE

## 2014-08-29 LAB — POC URINE PREG, ED: Preg Test, Ur: NEGATIVE

## 2014-08-29 MED ORDER — PANTOPRAZOLE SODIUM 40 MG PO TBEC
40.0000 mg | DELAYED_RELEASE_TABLET | Freq: Every day | ORAL | Status: DC
  Administered 2014-08-29 – 2014-08-31 (×3): 40 mg via ORAL
  Filled 2014-08-29 (×3): qty 1

## 2014-08-29 MED ORDER — OXYCODONE HCL 5 MG PO TABS
5.0000 mg | ORAL_TABLET | Freq: Four times a day (QID) | ORAL | Status: DC | PRN
  Administered 2014-08-29 – 2014-08-31 (×3): 5 mg via ORAL
  Filled 2014-08-29 (×3): qty 1

## 2014-08-29 MED ORDER — METOCLOPRAMIDE HCL 5 MG/ML IJ SOLN
10.0000 mg | Freq: Once | INTRAMUSCULAR | Status: AC
  Administered 2014-08-29: 10 mg via INTRAVENOUS
  Filled 2014-08-29: qty 2

## 2014-08-29 MED ORDER — HYDROMORPHONE HCL 1 MG/ML IJ SOLN
1.0000 mg | Freq: Once | INTRAMUSCULAR | Status: AC
  Administered 2014-08-29: 1 mg via INTRAVENOUS
  Filled 2014-08-29: qty 1

## 2014-08-29 MED ORDER — ONDANSETRON HCL 4 MG/2ML IJ SOLN
4.0000 mg | Freq: Once | INTRAMUSCULAR | Status: AC
  Administered 2014-08-29: 4 mg via INTRAVENOUS
  Filled 2014-08-29: qty 2

## 2014-08-29 MED ORDER — LORAZEPAM 2 MG/ML IJ SOLN
1.0000 mg | Freq: Once | INTRAMUSCULAR | Status: AC
  Administered 2014-08-29: 1 mg via INTRAVENOUS
  Filled 2014-08-29: qty 1

## 2014-08-29 MED ORDER — HEPARIN SODIUM (PORCINE) 5000 UNIT/ML IJ SOLN: 5000.0000 [IU] | Freq: Three times a day (TID) | INTRAMUSCULAR | Status: DC

## 2014-08-29 MED ORDER — SODIUM CHLORIDE 0.9 % IV BOLUS (SEPSIS)
1000.0000 mL | Freq: Once | INTRAVENOUS | Status: AC
  Administered 2014-08-29: 1000 mL via INTRAVENOUS

## 2014-08-29 MED ORDER — HEPARIN SODIUM (PORCINE) 5000 UNIT/ML IJ SOLN
5000.0000 [IU] | Freq: Three times a day (TID) | INTRAMUSCULAR | Status: DC
  Administered 2014-08-29 – 2014-08-30 (×2): 5000 [IU] via SUBCUTANEOUS
  Filled 2014-08-29 (×7): qty 1

## 2014-08-29 MED ORDER — OXYCODONE HCL 5 MG PO TABS
5.0000 mg | ORAL_TABLET | Freq: Once | ORAL | Status: AC
Start: 2014-08-29 — End: 2014-08-29
  Administered 2014-08-29: 5 mg via ORAL
  Filled 2014-08-29: qty 1

## 2014-08-29 MED ORDER — SODIUM CHLORIDE 0.9 % IV SOLN
INTRAVENOUS | Status: DC
  Administered 2014-08-29 – 2014-08-30 (×2): via INTRAVENOUS
  Administered 2014-08-31: 75 mL/h via INTRAVENOUS

## 2014-08-29 MED ORDER — SODIUM CHLORIDE 0.9 % IJ SOLN
3.0000 mL | Freq: Two times a day (BID) | INTRAMUSCULAR | Status: DC
  Administered 2014-08-30 – 2014-08-31 (×2): 3 mL via INTRAVENOUS

## 2014-08-29 NOTE — ED Notes (Signed)
Pt unable to provide urine at this time. Aware of sample needed.

## 2014-08-29 NOTE — ED Notes (Signed)
Pt states she began having abd pain acrossed entire abd. Nausea, vomiting, diarrhea occuring since abd pain started. Pt states she has thrown up and had diarrhea "too many times to count". States she has hepatitis C and believes it is her liver "acting up." Pt also states she has measured temp at home and most recent was 102.7

## 2014-08-29 NOTE — Progress Notes (Signed)
Pt stated that she will not take enema until she gets pain meds. Nurse notified MD. Md to place order for oxycodone PO. Will monitor  Andrew AuVafiadis, Jsean Taussig I 08/29/2014 5:39 PM

## 2014-08-29 NOTE — Progress Notes (Signed)
08/29/14 [facility] with Dx of N/V/Diarrhea.

## 2014-08-29 NOTE — H&P (Signed)
Date: 08/29/2014               Patient Name:  Amy Walls MRN: [id number]  DOB: 08-Sep-1980 Age / Sex: 34 y.o., female   PCP: No Pcp Per Patient         Medical Service: Internal Medicine Teaching Service         Attending Physician: Dr. Inez Catalina, MD    First Contact: Lenise Herald, MS4 Pager: [phone]  Second Contact: Dr. Mariea Clonts Pager: [phone]       After Hours (After 5p/  First Contact Pager: [phone]  weekends / holidays): Second Contact Pager: [phone]   Chief Complaint: Intractable  vomiting  History of Present Illness:  [name] is a 34 year old woman with PMH of IBS, small bowel obstruction, anxiety, depression, multiple UTI's, chronic pain, who presents with two days of vomiting, diarrhea, and abdominal pain. She states that she was in her usual state of health until yesterday morning when she started feeling sharp diffuse abdominal pain with nausea and vomiting. She had emesis 4-5 times, small quantities, with occasional streaks of blood. The vomiting is provoked by eating. She also started having 4-5 liquid small amounts of liquid stool with no blood. She did notice blood in the toilet paper once yesterday but not today. She denies eating out, eating new foods, or having contacts with persons with similar symptoms. Due to her IBS she often has either diarrhea or constipation but rarely has abdominal pain. She was treated for UTI last month, currently denies dysuria or increased urinary frequency.     Medications Prior to Admission  Medication Sig Dispense Refill  . gabapentin (NEURONTIN) 600 MG tablet Take 600 mg by mouth 2 (two) times daily.    . Omega-3 Fatty Acids (FISH OIL PO) Take 1 capsule by mouth daily.    Marland Kitchen etonogestrel (IMPLANON) 68 MG IMPL implant 1 each by Subdermal route once.    . nitrofurantoin, macrocrystal-monohydrate, (MACROBID) 100 MG capsule Take 1 capsule (100 mg total) by mouth 2 (two) times daily. X 7 days (Patient not taking: Reported on 08/29/2014) 14  capsule 0  . ondansetron (ZOFRAN) 4 MG tablet Take 1 tablet (4 mg total) by mouth every 6 (six) hours. (Patient not taking: Reported on 08/17/2014) 12 tablet 0  . oxycodone (OXY-IR) 5 MG capsule Take 1 capsule (5 mg total) by mouth every 4 (four) hours as needed. (Patient not taking: Reported on 08/29/2014) 8 capsule 0    Meds: Current Facility-Administered Medications  Medication Dose Route Frequency Provider Last Rate Last Dose  . 0.9 %  sodium chloride infusion   Intravenous Continuous Ky Barban, MD 100 mL/hr at 08/29/14 1605    . heparin injection 5,000 Units  5,000 Units Subcutaneous 3 times per day Inez Catalina, MD      . oxyCODONE (Oxy IR/ROXICODONE) immediate release tablet 5 mg  5 mg Oral Q6H PRN Lorenda Hatchet, MD      . pantoprazole (PROTONIX) EC tablet 40 mg  40 mg Oral Daily Ky Barban, MD   40 mg at 08/29/14 1820  . sodium chloride 0.9 % injection 3 mL  3 mL Intravenous Q12H Ky Barban, MD   0 mL at 08/29/14 1432    Allergies: Allergies as of 08/29/2014 - Review Complete 08/29/2014  Allergen Reaction Noted  . Ciprofloxacin Anaphylaxis 04/24/2014  . Levaquin [levofloxacin] Other (See Comments) 12/28/2011  . Morphine and related Nausea And Vomiting 12/28/2011  . Penicillins  Anaphylaxis 12/28/2011  . Toradol [ketorolac tromethamine] Other (See Comments) 12/28/2011  . Ultram [tramadol] Other (See Comments) 12/28/2011  . Acetaminophen  01/15/2013  . Phenergan [promethazine hcl] Hives 01/15/2013  . Sulfa antibiotics Nausea And Vomiting 04/24/2014  . Keflex [cephalexin] Rash 04/24/2014   Past Medical History  Diagnosis Date  . Asthma   . Complication of anesthesia 2007    "lost me on the table when I had upper bowel obstruction"  . PONV (postoperative nausea and vomiting)   . Chronic bronchitis     "yearly"  . Hypoglycemia     "my sugar runs low alot"  . Migraines 12/28/11    "@ least once/wk"  . Recurrent UTI   . Kidney infection      "often"  . Bowel obstruction 2007    "upper bowel"  . Arthritis     "inflammation of all my joints"  . Chronic mid back pain   . Fibromyalgia   . Anxiety   . Depression   . ADHD (attention deficit hyperactivity disorder)    Past Surgical History  Procedure Laterality Date  . Bowel resection  04/2006    "upper bowel"  . Ovary surgery  ~ 2002    "had 10 inches blood built up; they opened me up and got the blood out"   Family History  Problem Relation Age of Onset  . Breast cancer Maternal Grandmother   . Colon cancer Maternal Grandmother   . Irritable bowel syndrome Maternal Grandmother    History   Social History  . Marital Status: Single    Spouse Name: N/A    Number of Children: 0  . Years of Education: N/A   Occupational History  . unemployeed    Social History Main Topics  . Smoking status: Current Every Day Smoker -- 1.00 packs/day for 16 years    Types: Cigarettes  . Smokeless tobacco: Never Used     Comment: tobacco handout given 01/31/2012  . Alcohol Use: 1.2 oz/week    2 Cans of beer per week  . Drug Use: No  . Sexual Activity: Not Currently   Other Topics Concern  . Not on file   Social History Narrative    Review of Systems: Review of Systems  Constitutional: Negative for fever, chills, weight loss and diaphoresis.  HENT: Negative for congestion and sore throat.   Respiratory: Negative for cough and shortness of breath.   Cardiovascular: Negative for chest pain and leg swelling.  Gastrointestinal: Positive for nausea, vomiting, abdominal pain, diarrhea and blood in stool. Negative for constipation and melena.  Genitourinary: Negative for dysuria, urgency, frequency and flank pain.  Musculoskeletal: Negative for myalgias.  Neurological: Negative for dizziness, focal weakness and weakness.  Psychiatric/Behavioral: Negative for depression. The patient is not nervous/anxious.     Physical Exam: Blood pressure 112/70, pulse 68, temperature 99.5 F  (37.5 C), temperature source Oral, resp. rate 16, height  (1.473 m), weight 132 lb 7.9 oz (60.1 kg), last menstrual period 08/27/2014, SpO2 96 %. Physical Exam  Nursing note and vitals reviewed. Constitutional: She is oriented to person, place, and time. She appears well-developed and well-nourished. No distress.  HENT:  Head: Normocephalic.  Mouth/Throat: Oropharynx is clear and moist.  Eyes: Conjunctivae are normal. No scleral icterus.  Neck: Neck supple.  Cardiovascular: Normal rate and regular rhythm.   Respiratory: Effort normal. No respiratory distress. She has no wheezes. She has no rales.  GI: Soft. She exhibits no distension and no mass. There  is tenderness. There is no rebound and no guarding.  Hypoactive bowel sounds, TTP in all 4 quadrants with deep palpation  Genitourinary:  Declines rectal exam  Musculoskeletal: She exhibits no edema or tenderness.  Neurological: She is alert and oriented to person, place, and time.  Skin: Skin is warm and dry. No rash noted. She is not diaphoretic.  Psychiatric: She has a normal mood and affect.     Lab results: Basic Metabolic Panel:  Recent Labs  16/10/96 0910  NA 134*  K 3.9  CL 98  CO2 26  GLUCOSE 100*  BUN 16  CREATININE 0.79  CALCIUM 9.3   Liver Function Tests:  Recent Labs  08/29/14 0910  AST 191*  ALT 158*  ALKPHOS 103  BILITOT 0.6  PROT 7.4  ALBUMIN 4.0    Recent Labs  08/29/14 0910  LIPASE 26   CBC:  Recent Labs  08/29/14 0910  WBC 12.6*  NEUTROABS 9.6*  HGB 15.4*  HCT 44.6  MCV 92.3  PLT 311   Urine Drug Screen: Drugs of Abuse     Component Value Date/Time   LABOPIA POSITIVE* 08/29/2014 0955   COCAINSCRNUR NONE DETECTED 08/29/2014 0955   LABBENZ NONE DETECTED 08/29/2014 0955   AMPHETMU NONE DETECTED 08/29/2014 0955   THCU NONE DETECTED 08/29/2014 0955   LABBARB NONE DETECTED 08/29/2014 0955    Urinalysis:  Recent Labs  08/29/14 0955  COLORURINE AMBER*  LABSPEC 1.029    PHURINE 6.0  GLUCOSEU NEGATIVE  HGBUR NEGATIVE  BILIRUBINUR SMALL*  KETONESUR >80*  PROTEINUR NEGATIVE  UROBILINOGEN 1.0  NITRITE NEGATIVE  LEUKOCYTESUR SMALL*    Imaging results:  Dg Abd Acute W/chest  08/29/2014   CLINICAL DATA:  Abdominal pain, emesis, diarrhea  EXAM: ACUTE ABDOMEN SERIES (ABDOMEN 2 VIEW & CHEST 1 VIEW)  COMPARISON:  None.  FINDINGS: There is no evidence of dilated bowel loops or free intraperitoneal air. Moderate amount of stool in the ascending colon. No radiopaque calculi or other significant radiographic abnormality is seen. Heart size and mediastinal contours are within normal limits. Both lungs are clear.  IMPRESSION: Negative abdominal radiographs.  No acute cardiopulmonary disease.   Electronically Signed   By: Elige Ko   On: 08/29/2014 10:38    Assessment & Plan by Problem: 34 yr old woman with PMH of IBS, chronic pain, presenting with abdominal pain, intractable vomiting, diarrhea.   Intractable vomiting:  Per Abc Xray, has increased stool burden with no obstruction but perhaps early ileus that could be a cause for her vomiting thought viral enteritis is still highly likely. Her nausea has improved with Reglan and Zofran and she feels like she could try clears.  -Admit to Med Surg -CMP in am -NSL 147ml/h -Clear liquid diet -Zofran PRN for nausea -Protonix  Abdominal pain: No signs of acute abdomen/peritonitis. Abd Xray with increased stool burden but no osbtructrion concerning for early ileus. Lipase negative. No leucocytosis, afebrile. No dysuria/increased urinary frequency. Preg test negative.  -Will try to avoid narcotics -Tap water enema--repeat as needed  Diarrhea: The pt describes multiple liquid stools of small quantity which is very suspicious for encopresis in context of stool burden per Abd Xray.  -Tap water enema -f/u on fecal occult blood -avoid narcotic per above -pt encouraged to follow up with her new PCP at the [facility] for  GI referral (she wants a new GI physician but has seen Dr. Rhea Belton in the past)   Transaminitis: She reports being told that she had  Hep C two months ago while in a hospital in [place], [place]. Her LFTs are mildly elevated but have been so for months and appear to be stable. This could be secondary to hepatitis C chronic infection.  -Call hospital in [place] to confirm diagnosis--she may need repeat labs with new PCP and may need a good candidate for tx with Harvoni  -Repeat CMT in am  DVT prophylaxis: heparin [place] TID  FEN:   NSL 18100ml/h CMP in am Clear liquid diet  Dispo: Disposition is deferred at this time, awaiting improvement of current medical problems. Anticipated discharge in approximately 1-2  day(s).   The patient does have a current PCP, Dr. Ayesha Rumpfeepak and [facility] not [facility] after discharge.  The patient does not have transportation limitations that hinder transportation to clinic appointments.  Signed: Ky BarbanSolianny D Murrell Dome, MD  [facility], PGY3 08/29/2014, 9:33 PM

## 2014-08-29 NOTE — Progress Notes (Signed)
Pt refused water enema at this time. Pt stated she needs some more time.  Nurse to follow up. Will monitor   Andrew AuVafiadis, Totiana Everson I 08/29/2014 5:40 PM

## 2014-08-29 NOTE — H&P (Signed)
Date: 08/29/2014               Patient Name:  Amy Walls MRN: [id number]  DOB: 1981/08/21 Age / Sex: 34 y.o., female   PCP: No Pcp Per Patient         Medical Service: Internal Medicine Teaching Service         Attending Physician: Dr. Inez Catalina, MD    First Contact: Lenise Herald, MS4 Pager: [phone]  Second Contact: Dr. Inocente Salles Pager: [phone]       After Hours (After 5p/  First Contact Pager: [phone]  weekends / holidays): Second Contact Pager: [phone]   Chief Complaint: Vomitting and Diarrhea   History of Present Illness: Ms. Amy Walls is a 34 year old woman with a history of IBS, small bowel obstruction, Hep C, multiple UTI's, anxiety, depression, and chronic pain who presents with two days of worsening vomiting, diarrhea, and abdominal pain.   She states that she first started experiencing symptoms sometime yesterday morning. She reports multiple episodes of diarrhea. She also reports several episodes of vomiting. She also endorses sharp abdominal pain that is essentially constant.   She states that the diarrhea is watery and of mostly small volume. She denies frank blood in her stool, but has noticed some bright red blood on the toilet paper.   She states that the vomiting is brought on by eating. She does endorse small bits of blood in her vomit, but denies true hematemesis.   She describes the pain in her stomach as a sharp, crampy pain that she feels mostly all over. It is made worse with movement and defecation.   She denies any recent change in diet or possible food toxin exposure. She denies any recent sick contacts. She states that she often experiences diarrhea and/or constipation due to her IBS, but she does not usually experience such severe abdominal pain with those episodes.   Of note, she did have a UTI diagnosed on 08/17/14 and just recently complete a course of   Meds: Current Facility-Administered Medications  Medication Dose Route Frequency Provider  Last Rate Last Dose  . 0.9 %  sodium chloride infusion   Intravenous Continuous Ky Barban, MD 100 mL/hr at 08/29/14 1605    . heparin injection 5,000 Units  5,000 Units Subcutaneous 3 times per day Inez Catalina, MD      . pantoprazole (PROTONIX) EC tablet 40 mg  40 mg Oral Daily Ky Barban, MD      . sodium chloride 0.9 % injection 3 mL  3 mL Intravenous Q12H Ky Barban, MD   0 mL at 08/29/14 1432    Allergies: Allergies as of 08/29/2014 - Review Complete 08/29/2014  Allergen Reaction Noted  . Ciprofloxacin Anaphylaxis 04/24/2014  . Levaquin [levofloxacin] Other (See Comments) 12/28/2011  . Morphine and related Nausea And Vomiting 12/28/2011  . Penicillins Anaphylaxis 12/28/2011  . Toradol [ketorolac tromethamine] Other (See Comments) 12/28/2011  . Ultram [tramadol] Other (See Comments) 12/28/2011  . Acetaminophen  01/15/2013  . Phenergan [promethazine hcl] Hives 01/15/2013  . Sulfa antibiotics Nausea And Vomiting 04/24/2014  . Keflex [cephalexin] Rash 04/24/2014   Past Medical History  Diagnosis Date  . Asthma   . Complication of anesthesia 2007    "lost me on the table when I had upper bowel obstruction"  . PONV (postoperative nausea and vomiting)   . Chronic bronchitis     "yearly"  . Hypoglycemia     "my sugar runs  low alot"  . Migraines 12/28/11    "@ least once/wk"  . Recurrent UTI   . Kidney infection     "often"  . Bowel obstruction 2007    "upper bowel"  . Arthritis     "inflammation of all my joints"  . Chronic mid back pain   . Fibromyalgia   . Anxiety   . Depression   . ADHD (attention deficit hyperactivity disorder)    Past Surgical History  Procedure Laterality Date  . Bowel resection  04/2006    "upper bowel"  . Ovary surgery  ~ 2002    "had 10 inches blood built up; they opened me up and got the blood out"   Family History  Problem Relation Age of Onset  . Breast cancer Maternal Grandmother   . Colon cancer Maternal  Grandmother   . Irritable bowel syndrome Maternal Grandmother    History   Social History  . Marital Status: Single    Spouse Name: N/A    Number of Children: 0  . Years of Education: N/A   Occupational History  . unemployeed    Social History Main Topics  . Smoking status: Current Every Day Smoker -- 1.00 packs/day for 16 years    Types: Cigarettes  . Smokeless tobacco: Never Used     Comment: tobacco handout given 01/31/2012  . Alcohol Use: 1.2 oz/week    2 Cans of beer per week  . Drug Use: No  . Sexual Activity: Not Currently   Other Topics Concern  . Not on file   Social History Narrative    Review of Systems: Constitutional: positive for subjective fevers and chills  Respiratory: negative Cardiovascular: negative Gastrointestinal: positive for abdominal pain, constipation, nausea and vomiting Genitourinary:negative for dysuria and frequency  Physical Exam: Blood pressure 112/70, pulse 68, temperature 99.5 F (37.5 C), temperature source Oral, resp. rate 16, height  (1.473 m), weight 60.1 kg (132 lb 7.9 oz), last menstrual period 08/27/2014, SpO2 96 %. General appearance: fatigued Head: Normocephalic, without obvious abnormality, atraumatic Eyes: conjunctivae/corneas clear. PERRL, EOM's intact. Fundi benign. Throat: lips, mucosa, and tongue normal; teeth and gums normal Lungs: clear to auscultation bilaterally Heart: regular rate and rhythm, S1, S2 normal, no murmur, click, rub or gallop Abdomen: hyperactive bowel sounds, diffusely tender to deep palpation, no rebound or guarding, no mass or organomegaly Extremities: extremities normal, atraumatic, no cyanosis or edema Pulses: 2+ and symmetric Rectal: deferred, pt. states she was just examined by ER physician  Lab results: Basic Metabolic Panel:  Recent Labs  16/10/96 0910  NA 134*  K 3.9  CL 98  CO2 26  GLUCOSE 100*  BUN 16  CREATININE 0.79  CALCIUM 9.3   Liver Function Tests:  Recent  Labs  08/29/14 0910  AST 191*  ALT 158*  ALKPHOS 103  BILITOT 0.6  PROT 7.4  ALBUMIN 4.0    Recent Labs  08/29/14 0910  LIPASE 26   CBC:  Recent Labs  08/29/14 0910  WBC 12.6*  NEUTROABS 9.6*  HGB 15.4*  HCT 44.6  MCV 92.3  PLT 311   Urine Drug Screen: Drugs of Abuse     Component Value Date/Time   LABOPIA POSITIVE* 08/29/2014 0955   COCAINSCRNUR NONE DETECTED 08/29/2014 0955   LABBENZ NONE DETECTED 08/29/2014 0955   AMPHETMU NONE DETECTED 08/29/2014 0955   THCU NONE DETECTED 08/29/2014 0955   LABBARB NONE DETECTED 08/29/2014 0955    Urinalysis:  Recent Labs  08/29/14 0454  COLORURINE AMBER*  LABSPEC 1.029  PHURINE 6.0  GLUCOSEU NEGATIVE  HGBUR NEGATIVE  BILIRUBINUR SMALL*  KETONESUR >80*  PROTEINUR NEGATIVE  UROBILINOGEN 1.0  NITRITE NEGATIVE  LEUKOCYTESUR SMALL*     Imaging results:  Dg Abd Acute W/chest  08/29/2014   CLINICAL DATA:  Abdominal pain, emesis, diarrhea  EXAM: ACUTE ABDOMEN SERIES (ABDOMEN 2 VIEW & CHEST 1 VIEW)  COMPARISON:  None.  FINDINGS: There is no evidence of dilated bowel loops or free intraperitoneal air. Moderate amount of stool in the ascending colon. No radiopaque calculi or other significant radiographic abnormality is seen. Heart size and mediastinal contours are within normal limits. Both lungs are clear.  IMPRESSION: Negative abdominal radiographs.  No acute cardiopulmonary disease.   Electronically Signed   By: Elige KoHetal  Patel   On: 08/29/2014 10:38    Assessment & Plan by Problem: Principal Problem:   Intractable vomiting Active Problems:   Transaminitis   Diarrhea  34 year old woman with multiple medical problems presenting with vomiting, diarrhea, and severe abdominal pain.   Vomiting, diarrhea, and abdominal pain: Patient presents with multiple episodes of diarrhea and vomiting as well as severe abdominal pain. Abdominal X-ray shows no evidence of bowel obstruction, but with moderate amounts of stool in the colon.  Given the presence of stool in the colon on KUB, as well as history of IBS with intermittent constipation, suspect that this could represent severe encopresis. Lipase is negative ruling out pancreatitis. Patient also just completed a course of antibiotics, making C. diff infection a possibility. Finally, patient reports chronic NSAID use with some blood in vomit, making gastritis a possible contributing factor.  - will check stool for C. Diff - after discussion with patient, will attempt tap water enema to relieve constipation and hopefully help with abdominal pain.  - will try to avoid opiate pain medications as much as possible so as not to compound constipation - will start Protonix 40 mg for possible gastritis - Zofran and Reglan as needed for nausea - hold NSAIDs  Hepatitis C: Patient reports a history of Hep C diagnosed at an outside hospital, but she has not received any treatment yet. She has a mild transaminitis on admission, but her LFTs are otherwise normal.   Dispo: Disposition is deferred at this time, awaiting improvement of current medical problems. Anticipated discharge in approximately 2 day(s).   The patient does not have a current PCP (No Pcp Per Patient , although [facility] of next [facility] (09/02/2014).    Signed: Lenise HeraldElias Sakari Raisanen, Med Student 08/29/2014, 4:19 PM

## 2014-08-29 NOTE — ED Provider Notes (Signed)
CSN: [id number]     Arrival date & time 08/29/14  0806 History   First MD Initiated Contact with Patient 08/29/14 [phone]     Chief Complaint  Patient presents with  . Emesis  . Abdominal Pain  . Diarrhea     (Consider location/radiation/quality/duration/timing/severity/associated sxs/prior Treatment) HPI Comments: Patient with a history of Hepatitis C, IBS, and SBO presents today with abdominal pain, nausea, vomiting, and diarrhea.  She reports onset of symptoms yesterday afternoon.  She reports numerous episodes of diarrhea and numerous episodes of vomiting.  Abdominal pain is diffuse and does not radiate.  Pain is worse in the epigastric area.  She has taken Motrin for the pain, but does not feel that it is helping.  She reports abdominal pain is worse with vomiting.  She has taken Pedialyte for the nausea, but reports that she has been unable to keep it down.  She reports a fever of 101.3 F orally yesterday, but no fever today.  She has not taken an antipyretic today.  She denies urinary symptoms, vaginal discharge, or constipation.  She reports that symptoms do not feel similar to when she has had a SBO in the past.    The history is provided by the patient.    Past Medical History  Diagnosis Date  . Asthma   . Complication of anesthesia 2007    "lost me on the table when I had upper bowel obstruction"  . PONV (postoperative nausea and vomiting)   . Chronic bronchitis     "yearly"  . Hypoglycemia     "my sugar runs low alot"  . Migraines 12/28/11    "@ least once/wk"  . Recurrent UTI   . Kidney infection     "often"  . Bowel obstruction 2007    "upper bowel"  . Arthritis     "inflammation of all my joints"  . Chronic mid back pain   . Fibromyalgia   . Anxiety   . Depression   . ADHD (attention deficit hyperactivity disorder)    Past Surgical History  Procedure Laterality Date  . Bowel resection  04/2006    "upper bowel"  . Ovary surgery  ~ 2002    "had 10 inches blood  built up; they opened me up and got the blood out"   Family History  Problem Relation Age of Onset  . Breast cancer Maternal Grandmother   . Colon cancer Maternal Grandmother   . Irritable bowel syndrome Maternal Grandmother    History  Substance Use Topics  . Smoking status: Current Every Day Smoker -- 1.00 packs/day for 16 years    Types: Cigarettes  . Smokeless tobacco: Never Used     Comment: tobacco handout given 01/31/2012  . Alcohol Use: 1.2 oz/week    2 Cans of beer per week   OB History    No data available     Review of Systems  All other systems reviewed and are negative.     Allergies  Ciprofloxacin; Levaquin; Morphine and related; Penicillins; Toradol; Ultram; Acetaminophen; Phenergan; Sulfa antibiotics; and Keflex  Home Medications   Prior to Admission medications   Medication Sig Start Date End Date Taking? Authorizing Provider  etonogestrel (IMPLANON) 68 MG IMPL implant 1 each by Subdermal route once.    Historical Provider, MD  gabapentin (NEURONTIN) 600 MG tablet Take 600 mg by mouth 2 (two) times daily.    Historical Provider, MD  nitrofurantoin, macrocrystal-monohydrate, (MACROBID) 100 MG capsule Take 1 capsule (100 mg  total) by mouth 2 (two) times daily. X 7 days 08/17/14   Gilda Creasehristopher J. Pollina, MD  Omega-3 Fatty Acids (FISH OIL PO) Take 1 capsule by mouth daily.    Historical Provider, MD  ondansetron (ZOFRAN) 4 MG tablet Take 1 tablet (4 mg total) by mouth every 6 (six) hours. Patient not taking: Reported on 08/17/2014 05/29/14   Dorthula Matasiffany G Greene, PA-C  oxycodone (OXY-IR) 5 MG capsule Take 1 capsule (5 mg total) by mouth every 4 (four) hours as needed. Patient taking differently: Take 5 mg by mouth every 4 (four) hours as needed for pain.  05/29/14   Tiffany Irine SealG Greene, PA-C   BP 106/74 mmHg  Pulse 75  Temp(Src) 98.6 F (37 C) (Oral)  Resp 10  Ht 4\' 11"  (1.499 m)  Wt 129 lb (58.514 kg)  BMI 26.04 kg/m2  SpO2 94%  LMP 08/27/2014 (Exact  Date) Physical Exam  Constitutional: She appears well-developed and well-nourished.  HENT:  Head: Normocephalic and atraumatic.  Mouth/Throat: Oropharynx is clear and moist.  Neck: Normal range of motion. Neck supple.  Cardiovascular: Normal rate, regular rhythm and normal heart sounds.   Pulmonary/Chest: Effort normal and breath sounds normal.  Abdominal: Soft. Bowel sounds are normal. She exhibits no distension and no mass. There is generalized tenderness. There is no rebound and no guarding.  Midline surgical scar  Genitourinary: Guaiac negative stool.  Stool appears brown in color.     Musculoskeletal: Normal range of motion.  Neurological: She is alert.  Skin: Skin is warm and dry.  Psychiatric: She has a normal mood and affect.  Nursing note and vitals reviewed.   ED Course  Procedures (including critical care time) Labs Review Labs Reviewed  CBC WITH DIFFERENTIAL  COMPREHENSIVE METABOLIC PANEL  LIPASE, BLOOD  URINALYSIS, ROUTINE W REFLEX MICROSCOPIC  OCCULT BLOOD X 1 CARD TO LAB, STOOL  POC URINE PREG, ED    Imaging Review Dg Abd Acute W/chest  08/29/2014   CLINICAL DATA:  Abdominal pain, emesis, diarrhea  EXAM: ACUTE ABDOMEN SERIES (ABDOMEN 2 VIEW & CHEST 1 VIEW)  COMPARISON:  None.  FINDINGS: There is no evidence of dilated bowel loops or free intraperitoneal air. Moderate amount of stool in the ascending colon. No radiopaque calculi or other significant radiographic abnormality is seen. Heart size and mediastinal contours are within normal limits. Both lungs are clear.  IMPRESSION: Negative abdominal radiographs.  No acute cardiopulmonary disease.   Electronically Signed   By: Elige KoHetal  Patel   On: 08/29/2014 10:38     EKG Interpretation None      10:00 AM Reassessed patient.  She reports that she continues to vomit and continues to have generalized abdominal pain, worse in the epigastric area.  Abdomen:  Soft with diffuse tenderness to palpation.  No rebound or  guarding. Will order repeat dose of Zofran IV. 10:45 AM Reassessed patient.  She reports that nausea and vomiting have continued.  Will order Ativan IV.  11:59 AM Reexamined patient.  She has diffuse tenderness to palpation of the abdomen.  No rebound or guarding.  She still continues to feel nauseous.  12:40 PM Reassessed patient.  She continues to vomit after having Zofran IV x 2 and Ativan.  Will order Reglan.  Feel that patient will need admission for observation and symptomatic control.   1:20 PM Discussed with Internal Medicine Teaching Service who has agreed to admit the patient.     MDM   Final diagnoses:  None   Patient presents  today with nausea, vomiting, and diarrhea since yesterday.  She reports numerous episodes of vomiting and diarrhea.  She is afebrile in the ED.  Abdomen is soft with mild diffuse tenderness to palpation, no rebound or guarding.  Acute abdominal series negative for evidence of obstruction.  She has had recent abdominal CT scans for abdominal pain.  Therefore, do not feel that another CT is indicated at this time.  Hemoccult is negative.  Labs showing mild leukocytosis.  Labs also showing elevated LFT's.  She does have a history of Hepatitis C and LFT's appear to be similar to baseline.  Lipase WNL.  Patient given Zofran IV x 2, Reglan, and Ativan in the ED without improvement in symptoms.  Therefore, patient admitted to Internal medicine for observation and symptomatic control.     Santiago Glad, PA-C 08/29/14 1450  Santiago Glad, PA-C 08/29/14 1450  Doug Sou, MD 08/29/14 1649

## 2014-08-29 NOTE — Progress Notes (Signed)
Pt arrived to unit per stretcher by ED nurse tech. No acute distress noted. Pt placed on telemetry. Assessment performed as charted. Will monitor  Andrew AuVafiadis, Fawn Desrocher I 3:46 PM 08/29/2014

## 2014-08-29 NOTE — ED Provider Notes (Signed)
Complains of diffuse abdominal pain multiple episodes of vomiting and multiple episodes of diarrhea onset yesterday. Reports hematemesis and watery diarrhea with flecks of blood. On exam abdomen with midline surgical scar. Normal active bowel sounds, nondistended, mild diffuse tenderness. Patient has had multiple CT scans in recent months. Suggest observation, symptomatic control   Doug SouSam Chance Munter, MD 08/29/14 1256

## 2014-08-29 NOTE — ED Notes (Signed)
Attempted to give report to RN 5W. Will call back.

## 2014-08-30 DIAGNOSIS — Z79891 Long term (current) use of opiate analgesic: Secondary | ICD-10-CM

## 2014-08-30 DIAGNOSIS — R1032 Left lower quadrant pain: Secondary | ICD-10-CM

## 2014-08-30 DIAGNOSIS — I82409 Acute embolism and thrombosis of unspecified deep veins of unspecified lower extremity: Secondary | ICD-10-CM

## 2014-08-30 LAB — COMPREHENSIVE METABOLIC PANEL
ALT: 115 U/L — ABNORMAL HIGH (ref 0–35)
AST: 145 U/L — AB (ref 0–37)
Albumin: 3.2 g/dL — ABNORMAL LOW (ref 3.5–5.2)
Alkaline Phosphatase: 77 U/L (ref 39–117)
Anion gap: 5 (ref 5–15)
BUN: 9 mg/dL (ref 6–23)
CALCIUM: 8.4 mg/dL (ref 8.4–10.5)
CO2: 25 mmol/L (ref 19–32)
Chloride: 105 mEq/L (ref 96–112)
Creatinine, Ser: 0.75 mg/dL (ref 0.50–1.10)
GFR calc Af Amer: >90 mL/min (ref >90)
GLUCOSE: 90 mg/dL (ref 70–99)
POTASSIUM: 3.9 mmol/L (ref 3.5–5.1)
Sodium: 135 mmol/L (ref 135–145)
Total Bilirubin: 0.5 mg/dL (ref 0.3–1.2)
Total Protein: 5.8 g/dL — ABNORMAL LOW (ref 6.0–8.3)

## 2014-08-30 LAB — CBC
HEMATOCRIT: 39.3 % (ref 36.0–46.0)
Hemoglobin: 13.5 g/dL (ref 12.0–15.0)
MCH: 31.6 pg (ref 26.0–34.0)
MCHC: 34.4 g/dL (ref 30.0–36.0)
MCV: 92 fL (ref 78.0–100.0)
Platelets: 241 10*3/uL (ref 150–400)
RBC: 4.27 MIL/uL (ref 3.87–5.11)
RDW: 11.9 % (ref 11.5–15.5)
WBC: 9.2 10*3/uL (ref 4.0–10.5)

## 2014-08-30 MED ORDER — METHADONE HCL 10 MG PO TABS
80.0000 mg | ORAL_TABLET | Freq: Every day | ORAL | Status: DC
  Administered 2014-08-30 – 2014-08-31 (×2): 80 mg via ORAL
  Filled 2014-08-30 (×2): qty 8

## 2014-08-30 MED ORDER — MAGNESIUM CITRATE PO SOLN
0.5000 | Freq: Two times a day (BID) | ORAL | Status: DC
Start: 2014-08-30 — End: 2014-08-30
  Filled 2014-08-30 (×17): qty 296

## 2014-08-30 MED ORDER — NICOTINE 14 MG/24HR TD PT24
14.0000 mg | MEDICATED_PATCH | Freq: Every day | TRANSDERMAL | Status: DC
  Administered 2014-08-30 – 2014-08-31 (×2): 14 mg via TRANSDERMAL
  Filled 2014-08-30 (×2): qty 1

## 2014-08-30 MED ORDER — MAGNESIUM CITRATE PO SOLN
150.0000 mL | Freq: Two times a day (BID) | ORAL | Status: DC
  Filled 2014-08-30 (×18): qty 296

## 2014-08-30 MED ORDER — MAGNESIUM CITRATE PO SOLN
0.5000 | Freq: Two times a day (BID) | ORAL | Status: DC
  Administered 2014-08-30 – 2014-08-31 (×2): 0.5 via ORAL
  Filled 2014-08-30 (×17): qty 296

## 2014-08-30 MED ORDER — POLYETHYLENE GLYCOL 3350 17 G PO PACK
17.0000 g | PACK | Freq: Two times a day (BID) | ORAL | Status: DC
  Administered 2014-08-30: 17 g via ORAL
  Filled 2014-08-30 (×2): qty 1

## 2014-08-30 MED ORDER — SENNOSIDES-DOCUSATE SODIUM 8.6-50 MG PO TABS
2.0000 | ORAL_TABLET | Freq: Two times a day (BID) | ORAL | Status: DC
  Administered 2014-08-30 – 2014-08-31 (×3): 2 via ORAL
  Filled 2014-08-30 (×3): qty 2
  Filled 2014-08-30: qty 1

## 2014-08-30 MED ORDER — MAGNESIUM CITRATE PO SOLN
0.5000 | Freq: Two times a day (BID) | ORAL | Status: DC
  Filled 2014-08-30 (×18): qty 296

## 2014-08-30 NOTE — Progress Notes (Signed)
NCM assisted patient with Match Letter for medications, also gave patient information on hospital follow apt with [facility], she will need to make apt on Monday.

## 2014-08-30 NOTE — Progress Notes (Signed)
UR completed 

## 2014-08-30 NOTE — Progress Notes (Signed)
Subjective: Patient still complaining of abdominal pain and discomfort today. Has not had a bowel movement. Xray consistent with stool burden in ascending colon. Pt say she takes methadone  daily, and is requesting for this. Still having some nausea but has been able to tolerate clear liquids.   Objective: Vital signs in last 24 hours: Filed Vitals:   08/29/14 1430 08/29/14 1550 08/29/14 2229 08/30/14 0525  BP: 104/64 112/70 106/68 116/63  Pulse: 61 68 69 66  Temp:  99.5 F (37.5 C) 98.6 F (37 C) 98.3 F (36.8 C)  TempSrc:  Oral Oral Oral  Resp: Height:   (1.473 m)    Weight:  132 lb 7.9 oz (60.1 kg)    SpO2: 93% 96% 93% 94%   Weight change:   Intake/Output Summary (Last 24 hours) at 08/30/14 1334 Last data filed at 08/30/14 1007  Gross per 24 hour  Intake    540 ml  Output      0 ml  Net    540 ml   General appearance: alert, cooperative and no distress Head: Normocephalic, without obvious abnormality, atraumatic Lungs: clear to auscultation bilaterally Heart: regular rate and rhythm, S1, S2 normal, no murmur, click, rub or gallop Abdomen: soft, full, mildly distended, bowel sounds present, no masses,  no organomegaly Extremities: extremities normal, atraumatic, no cyanosis or edema  Lab Results: Basic Metabolic Panel:  Recent Labs Lab 08/29/14 0910 08/30/14 0535  NA 134* 135  K 3.9 3.9  CL 98 105  CO2 26 25  GLUCOSE 100* 90  BUN 16 9  CREATININE 0.79 0.75  CALCIUM 9.3 8.4   Liver Function Tests:  Recent Labs Lab 08/29/14 0910 08/30/14 0535  AST 191* 145*  ALT 158* 115*  ALKPHOS 103 77  BILITOT 0.6 0.5  PROT 7.4 5.8*  ALBUMIN 4.0 3.2*    Recent Labs Lab 08/29/14 0910  LIPASE 26   CBC:  Recent Labs Lab 08/29/14 0910 08/30/14 0535  WBC 12.6* 9.2  NEUTROABS 9.6*  --   HGB 15.4* 13.5  HCT 44.6 39.3  MCV 92.3 92.0  PLT 311 241   Urine Drug Screen: Drugs of Abuse     Component Value Date/Time   LABOPIA  POSITIVE* 08/29/2014 0955   COCAINSCRNUR NONE DETECTED 08/29/2014 0955   LABBENZ NONE DETECTED 08/29/2014 0955   AMPHETMU NONE DETECTED 08/29/2014 0955   THCU NONE DETECTED 08/29/2014 0955   LABBARB NONE DETECTED 08/29/2014 0955    Urinalysis:  Recent Labs Lab 08/29/14 0955  COLORURINE AMBER*  LABSPEC 1.029  PHURINE 6.0  GLUCOSEU NEGATIVE  HGBUR NEGATIVE  BILIRUBINUR SMALL*  KETONESUR >80*  PROTEINUR NEGATIVE  UROBILINOGEN 1.0  NITRITE NEGATIVE  LEUKOCYTESUR SMALL*   Micro Results: Recent Results (from the past 240 hour(s))  MRSA PCR Screening     Status: None   Collection Time: 08/29/14  6:22 PM  Result Value Ref Range Status   MRSA by PCR NEGATIVE NEGATIVE Final    Comment:        The GeneXpert MRSA Assay (FDA approved for NASAL specimens only), is one component of a comprehensive MRSA colonization surveillance program. It is not intended to diagnose MRSA infection nor to guide or monitor treatment for MRSA infections.    Studies/Results: Dg Abd Acute W/chest  08/29/2014   CLINICAL DATA:  Abdominal pain, emesis, diarrhea  EXAM: ACUTE ABDOMEN SERIES (ABDOMEN 2 VIEW & CHEST 1 VIEW)  COMPARISON:  None.  FINDINGS: There is no  evidence of dilated bowel loops or free intraperitoneal air. Moderate amount of stool in the ascending colon. No radiopaque calculi or other significant radiographic abnormality is seen. Heart size and mediastinal contours are within normal limits. Both lungs are clear.  IMPRESSION: Negative abdominal radiographs.  No acute cardiopulmonary disease.   Electronically Signed   By: Elige KoHetal  Patel   On: 08/29/2014 10:38   Medications: I have reviewed the patient's current medications. Scheduled Meds: . heparin  5,000 Units Subcutaneous 3 times per day  . magnesium citrate  150 mL Oral BID  . methadone  80 mg Oral Daily  . pantoprazole  40 mg Oral Daily  . polyethylene glycol  17 g Oral BID  . senna-docusate  2 tablet Oral BID  . sodium chloride  3 mL  Intravenous Q12H   Continuous Infusions: . sodium chloride 100 mL/hr at 08/30/14 0347   PRN Meds:.oxyCODONE Assessment/Plan:  Intractable vomiting:  Per Abd Xray, has increased stool burden with no obstruction but perhaps early ileus that could be a cause for her vomiting thought viral enteritis is still highly likely.  - LFTs this am elevated, but has Hx of hep c Infection, ensure follow up on discharge.  - reduce rate form NSL 12500ml/h to 75 cc/hr.  -Advance to full liq diet -Zofran PRN for nausea -Protonix  Abdominal pain: No signs of acute abdomen/peritonitis. Abd Xray with increased stool burden but no osbtructrion concerning for early ileus.   Chronic Methadone therapy- For past opioid use. Confirmed dose of methadone from patients clinic.  - Methadone- 80mg  daily  Diarrhea:  Not uncommon in patient with fecal obstruction, due to passage of liquid stool around fecaes, from bacteria acting on feaces.  -Tap water enema- was not effective, not surprising considering stool is in the ascending colon.  - Mag citrate 150mls BID - Miralax- one dose, D/c  - Sennakot- 2 tabs BID.  -avoid narcotic -pt encouraged to follow up with [facility] (she wants a new GI physician but has seen Dr. Rhea BeltonPyrtle in the past)   Transaminitis: She reports being told that she had Hep C two months ago while in a hospital in [place], [place]. Her LFTs are mildly elevated but have been so for months and appear to be stable. This could be secondary to hepatitis C chronic infection.  -Call hospital in [place] to confirm diagnosis--she may need repeat labs with new PCP and may need a good candidate for tx with Harvoni  - Ct abdomen- 05/29/2014- no liver cirrhosis or masses reported.  DVT prophylaxis: heparin [place] TID  FEN:  NSL 75 ml/h Full liq diet.  Dispo: Disposition is deferred at this time, awaiting improvement of current medical problems.  Anticipated discharge  in approximately 1 day(s).   .Services Needed at time of discharge: Y = Yes, Blank = No PT:   OT:   RN:   Equipment:   Other:     LOS: 1 day   Onnie BoerEjiroghene E Emokpae, MD 08/30/2014, 1:34 PM

## 2014-08-30 NOTE — H&P (Signed)
Subjective:    Currently, the patient's diarrhea has resolved, still with vomiting, now complaining of constipation. Still with abdominal pain. Patient also reports she is [facility], and needs to have her dose today.   Interval Events: No acute events. Tap water enema given, apparently did not provide much relief.    Objective:    Vital Signs:   Temp:  [98.3 F (36.8 C)-99.5 F (37.5 C)] 98.3 F (36.8 C) (01/08 0525) Pulse Rate:  [61-89] 66 (01/08 0525) Resp:  [12-23] 16 (01/08 0525) BP: (104-116)/(63-72) 116/63 mmHg (01/08 0525) SpO2:  [93 %-97 %] 94 % (01/08 0525) Weight:  [60.1 kg (132 lb 7.9 oz)] 60.1 kg (132 lb 7.9 oz) (01/07 1550) Last BM Date: 08/29/14  Intake/Output:   Intake/Output Summary (Last 24 hours) at 08/30/14 1322 Last data filed at 08/30/14 1007  Gross per 24 hour  Intake    540 ml  Output      0 ml  Net    540 ml      Physical Exam: General appearance: alert, cooperative and no distress Head: Normocephalic, without obvious abnormality, atraumatic Throat: lips, mucosa, and tongue normal; teeth and gums normal Lungs: clear to auscultation bilaterally Heart: regular rate and rhythm, S1, S2 normal, no murmur, click, rub or gallop Abdomen: soft, slightly distended, diffusely tender to deep palpation, normoactive bowel sounds, no mass or organomegaly, no rebound or guarding   Labs:  Basic Metabolic Panel:  Recent Labs Lab 08/29/14 0910 08/30/14 0535  NA 134* 135  K 3.9 3.9  CL 98 105  CO2 26 25  GLUCOSE 100* 90  BUN 16 9  CREATININE 0.79 0.75  CALCIUM 9.3 8.4    Liver Function Tests:  Recent Labs Lab 08/29/14 0910 08/30/14 0535  AST 191* 145*  ALT 158* 115*  ALKPHOS 103 77  BILITOT 0.6 0.5  PROT 7.4 5.8*  ALBUMIN 4.0 3.2*   CBC:  Recent Labs Lab 08/29/14 0910 08/30/14 0535  WBC 12.6* 9.2  NEUTROABS 9.6*  --   HGB 15.4* 13.5  HCT 44.6 39.3  MCV 92.3 92.0  PLT 311 241    Cardiac  Enzymes: No results for input(s): CKTOTAL, CKMB, CKMBINDEX, TROPONINI in the last 168 hours.  BNP: Invalid input(s): POCBNP  CBG: No results for input(s): GLUCAP in the last 168 hours.  Microbiology: Results for orders placed or performed during the hospital encounter of 08/29/14  MRSA PCR Screening     Status: None   Collection Time: 08/29/14  6:22 PM  Result Value Ref Range Status   MRSA by PCR NEGATIVE NEGATIVE Final    Comment:        The GeneXpert MRSA Assay (FDA approved for NASAL specimens only), is one component of a comprehensive MRSA colonization surveillance program. It is not intended to diagnose MRSA infection nor to guide or monitor treatment for MRSA infections.        Medications:    Infusions: . sodium chloride 100 mL/hr at 08/30/14 0347    Scheduled Medications: . heparin  5,000 Units Subcutaneous 3 times per day  . pantoprazole  40 mg Oral Daily  . polyethylene glycol  17 g Oral BID  . senna-docusate  2 tablet Oral BID  . sodium chloride  3 mL Intravenous Q12H    PRN Medications: oxyCODONE   Assessment/ Plan:    Pt is a 34 y.o. yo female with a PMHx of IBS, small bowel obstruction, anxiety, depression, and polysubstance  abuse who was admitted on 08/29/2014 with symptoms of nausea, vomiting, diarrhea, and abdominal pain.  Nausea/vomiting/diarrhea/abdominal pain: X-ray obtained yesterday and exam are reassuring that this is not SBO. Diarrhea has essentially resolved, still with abdominal pain and vomiting. Patient now reports past opioid abuse and recent initiation of methadone treatment, which raises the prospect of narcotic bowel syndrome and/or severe constipation as the cause of at least some of her symptoms. Did report some blood in her vomit, so gastritis could also be contributing.  - Spoke with dosing nurse at [facility], her current dose is 80 mg of methadone a day, and we will continue that while she is an inpatinet.  - the  water enema did not seem to help much, so we will initiate a bowel regimen with miralax, mag citrate and Senekot to help relieve constipation - avoiding opiates as much as possible to minimize constipation, but she does have oxycodone PRN for pain - Protonix po for possible gastritis - maintenance fluids 100 ml/hr NS while on clear diet -advancing diet as tolerated    DVT PPX - heparin  CODE STATUS - full  CONSULTS PLACED - None   DISPO - Disposition is deferred at this time, awaiting improvement of abdominal pain.   Anticipated discharge in approximately 1  day(s).   The patient does not have a current PCP (No PCP Per Patient) and does not need an [facility] hospital follow-up appointment after discharge.    Is the [facility] hospital follow-up appointment a one-time only appointment? not applicable.  Does the patient have transportation limitations that hinder transportation to clinic appointments? unknown   SERVICE NEEDED AT DISCHARGE - TO BE DETERMINED DURING HOSPITAL COURSE         Y = Yes, Blank = No PT:   OT:   RN:   Equipment:   Other:      Length of Stay: 1 day(s)   Signed: Lenise Herald, Med Student  Pager: [phone] (7AM-5PM) 08/30/2014, 1:22 PM

## 2014-08-31 MED ORDER — GABAPENTIN 600 MG PO TABS: 600.0000 mg | ORAL_TABLET | Freq: Two times a day (BID) | ORAL | Status: DC

## 2014-08-31 MED ORDER — POLYETHYLENE GLYCOL 3350 17 G PO PACK
17.0000 g | PACK | Freq: Two times a day (BID) | ORAL | Status: DC
  Administered 2014-08-31: 17 g via ORAL
  Filled 2014-08-31 (×2): qty 1

## 2014-08-31 MED ORDER — GABAPENTIN 600 MG PO TABS
600.0000 mg | ORAL_TABLET | Freq: Two times a day (BID) | ORAL | Status: DC
  Administered 2014-08-31: 600 mg via ORAL
  Filled 2014-08-31 (×2): qty 1

## 2014-08-31 MED ORDER — ZOLPIDEM TARTRATE 5 MG PO TABS
5.0000 mg | ORAL_TABLET | Freq: Every evening | ORAL | Status: DC | PRN
Start: 2014-08-31 — End: 2015-11-11

## 2014-08-31 MED ORDER — MAGNESIUM CITRATE PO SOLN: 0.5000 | Freq: Every day | ORAL | Status: DC | PRN

## 2014-08-31 MED ORDER — SENNOSIDES-DOCUSATE SODIUM 8.6-50 MG PO TABS: 2.0000 | ORAL_TABLET | Freq: Two times a day (BID) | ORAL | Status: DC

## 2014-08-31 MED ORDER — PANTOPRAZOLE SODIUM 40 MG PO TBEC: 40.0000 mg | DELAYED_RELEASE_TABLET | Freq: Every day | ORAL | Status: DC

## 2014-08-31 MED ORDER — NICOTINE 14 MG/24HR TD PT24: 14.0000 mg | MEDICATED_PATCH | Freq: Every day | TRANSDERMAL | Status: DC

## 2014-08-31 MED ORDER — POLYETHYLENE GLYCOL 3350 17 G PO PACK: 17.0000 g | PACK | Freq: Two times a day (BID) | ORAL | Status: DC

## 2014-08-31 NOTE — Progress Notes (Signed)
Utilization Review completed.  

## 2014-08-31 NOTE — Discharge Instructions (Signed)
You were admitted for abdominal pain, vomiting, and diarrhea.   You were found to be severely constipated, most likely due to your methadone treatment. To treat this, we have started you on some medications to help treat/prevent further constipation.   Please keep your appointment tomorrow with [facility] to establish with a primary care physician.

## 2014-08-31 NOTE — Progress Notes (Signed)
Subjective: Patient still with abdominal pain and discomfort, although this is improved. Has not had a BM yet, still feels very constipated. Appetite is better, tolerating food without vomiting.   Objective: Vital signs in last 24 hours: Filed Vitals:   08/30/14 0525 08/30/14 1349 08/30/14 2236 08/31/14 0613  BP: 116/63 124/84 108/63 110/67  Pulse: 66 87  59  Temp: 98.3 F (36.8 C) 98.1 F (36.7 C)  98.9 F (37.2 C)  TempSrc: Oral Oral  Oral  Resp: 16 16 17 16   Height:      Weight:      SpO2: 94% 100%  97%   Weight change:   Intake/Output Summary (Last 24 hours) at 08/31/14 0847 Last data filed at 08/31/14 0700  Gross per 24 hour  Intake 4008.33 ml  Output    350 ml  Net 3658.33 ml   General appearance: alert, cooperative and no distress Head: Normocephalic, without obvious abnormality, atraumatic Lungs: clear to auscultation bilaterally Heart: regular rate and rhythm, S1, S2 normal, no murmur, click, rub or gallop Abdomen: soft, full, slightly tender top palpation diffusely,  mildly distended, bowel sounds present, no masses,  no organomegaly Extremities: extremities normal, atraumatic, no cyanosis or edema  Lab Results: No new lab results in the past 24 hrs.   Medications: I have reviewed the patient's current medications. Scheduled Meds: . heparin  5,000 Units Subcutaneous 3 times per day  . magnesium citrate  0.5 Bottle Oral BID  . methadone  80 mg Oral Daily  . nicotine  14 mg Transdermal Daily  . pantoprazole  40 mg Oral Daily  . senna-docusate  2 tablet Oral BID  . sodium chloride  3 mL Intravenous Q12H   Continuous Infusions: None   PRN Meds:.oxyCODONE Assessment/Plan:  Intractable vomiting:  Per Abd Xray, has increased stool burden with no obstruction but perhaps early ileus that could be a cause for her vomiting thought viral enteritis is still highly likely.  - Advance to full diet - Zofran PRN for nausea - Protonix  Abdominal pain: No signs  of acute abdomen/peritonitis. Abd Xray with increased stool burden but no osbtructrion concerning for early ileus.   Chronic Methadone therapy- For past opioid use. Confirmed dose of methadone from patients clinic.  - Methadone- 80mg  daily  Diarrhea:  Not uncommon in patient with fecal obstruction, due to passage of liquid stool around fecaes, from bacteria acting on feaces.  -Tap water enema- was not effective, not surprising considering stool is in the ascending colon.  - Mag citrate 150mls BID - Miralax- one dose, D/c  - Sennakot- 2 tabs BID.  -avoid narcotic -pt encouraged to follow up with [facility] (she wants a new GI physician but has seen Dr. Rhea BeltonPyrtle in the past)   Transaminitis: She reports being told that she had Hep C two months ago while in a hospital in [place], [place]. Her LFTs are mildly elevated but have been so for months and appear to be stable. This could be secondary to hepatitis C chronic infection.  - Ct abdomen- 05/29/2014- no liver cirrhosis or masses reported. - To follow up with her PCP  DVT prophylaxis: heparin [place] TID  FEN:  Full diet as above  Dispo: Will advance diet today, and if patient is tolerating, then possible d/c later today.   .Services Needed at time of discharge: Y = Yes, Blank = No PT:   OT:   RN:   Equipment:  Other:     LOS: 2 days   Lenise Herald, Med Student 08/31/2014, 8:47 AM

## 2014-08-31 NOTE — Progress Notes (Signed)
Pt walked off unit to smoke a cigarette. MD notified. Had same conversation with patient yesterday evening after she left the unit to smoke. Pt is on methodone and has nicotine patch on. IV catheter in place and intact. Charge RN, Ginger notified as well.

## 2014-08-31 NOTE — Progress Notes (Addendum)
Paged MD about patient request for enema.   1500: Pt had large BM. Spoke with MD who stated they would write discharge orders.

## 2014-08-31 NOTE — Discharge Summary (Signed)
Patient Name: Blakleigh Straw  MRN:  [id number]   DOB: 1980/11/15   PCP: No Pcp Per Patient         Date of Admission: 08/29/2014  Date of Discharge: 08/31/2014        Attending Physician: Inez Catalina, MD    DISCHARGE DIAGNOSES: - Constipation - Possible viral enteritis  Other problems-  Asthma Tobacco abuse Irritable bowel syndrome  DISPOSITION AND FOLLOW-UP: Yessica Putnam is to follow-up with the listed providers as detailed below, at which time, the following should be addressed:   1. Constipation (PCP), Hep C (PCP)  2. Labs / imaging needed at time of follow-up: None  3. Pending labs/ test needing follow-up: None   DISCHARGE INSTRUCTIONS:   Follow-up Information    Follow up with [facility]    .   Why:  call on Monday at [phone] to make hospital follow up apt   Contact information:   [address] [facility] [address] [phone]      DISCHARGE MEDICATIONS:   Medication List    ASK your doctor about these medications        FISH OIL PO  Take 1 capsule by mouth daily.     gabapentin 600 MG tablet  Commonly known as:  NEURONTIN  Take 600 mg by mouth 2 (two) times daily.     IMPLANON 68 MG Impl implant  Generic drug:  etonogestrel  1 each by Subdermal route once.     METHADONE HCL PO  Take 80 mg by mouth every morning. [facility] [place] [facility] [place]     nitrofurantoin (macrocrystal-monohydrate) 100 MG capsule  Commonly known as:  MACROBID  Take 1 capsule (100 mg total) by mouth 2 (two) times daily. X 7 days     ondansetron 4 MG tablet  Commonly known as:  ZOFRAN  Take 1 tablet (4 mg total) by mouth every 6 (six) hours.     oxycodone 5 MG capsule  Commonly known as:  OXY-IR  Take 1 capsule (5 mg total) by mouth every 4 (four) hours as needed.         CONSULTS:    None   PROCEDURES PERFORMED:  Dg Abd Acute W/chest  08/29/2014   CLINICAL DATA:  Abdominal pain, emesis,  diarrhea  EXAM: ACUTE ABDOMEN SERIES (ABDOMEN 2 VIEW & CHEST 1 VIEW)  COMPARISON:  None.  FINDINGS: There is no evidence of dilated bowel loops or free intraperitoneal air. Moderate amount of stool in the ascending colon. No radiopaque calculi or other significant radiographic abnormality is seen. Heart size and mediastinal contours are within normal limits. Both lungs are clear.  IMPRESSION: Negative abdominal radiographs.  No acute cardiopulmonary disease.   Electronically Signed   By: Elige Ko   On: 08/29/2014 10:38       ADMISSION DATA: History of Present Illness:  Ms. Lempke is a 34 year old woman with PMH of IBS, small bowel obstruction, anxiety, depression, multiple UTI's, chronic pain, who presents with two days of vomiting, diarrhea, and abdominal pain. She states that she was in her usual state of health until yesterday morning when she started feeling sharp diffuse abdominal pain with nausea and vomiting. She had emesis 4-5 times, small quantities, with occasional streaks of blood. The vomiting is provoked by eating. She also started having 4-5 liquid small amounts of liquid stool with no blood. She did notice blood in the toilet  paper once yesterday but not today. She denies eating out, eating new foods, or having contacts with persons with similar symptoms. Due to her IBS she often has either diarrhea or constipation but rarely has abdominal pain. She was treated for UTI last month, currently denies dysuria or increased urinary frequency.   Physical Exam: Blood pressure 112/70, pulse 68, temperature 99.5 F (37.5 C), temperature source Oral, resp. rate 16, height 4\' 10"  (1.473 m), weight 132 lb 7.9 oz (60.1 kg), last menstrual period 08/27/2014, SpO2 96 %. Physical Exam  Nursing note and vitals reviewed. Constitutional: She is oriented to person, place, and time. She appears well-developed and well-nourished. No distress.  HENT:  Head: Normocephalic.  Mouth/Throat: Oropharynx is  clear and moist.  Eyes: Conjunctivae are normal. No scleral icterus.  Neck: Neck supple.  Cardiovascular: Normal rate and regular rhythm.  Respiratory: Effort normal. No respiratory distress. She has no wheezes. She has no rales.  GI: Soft. She exhibits no distension and no mass. There is tenderness. There is no rebound and no guarding.  Hypoactive bowel sounds, TTP in all 4 quadrants with deep palpation  Genitourinary:  Declines rectal exam  Musculoskeletal: She exhibits no edema or tenderness.  Neurological: She is alert and oriented to person, place, and time.  Skin: Skin is warm and dry. No rash noted. She is not diaphoretic.  Psychiatric: She has a normal mood and affect.   Lab results: Basic Metabolic Panel:  Recent Labs (last 2 labs)      Recent Labs  08/29/14 0910  NA 134*  K 3.9  CL 98  CO2 26  GLUCOSE 100*  BUN 16  CREATININE 0.79  CALCIUM 9.3     Liver Function Tests:  Recent Labs (last 2 labs)      Recent Labs  08/29/14 0910  AST 191*  ALT 158*  ALKPHOS 103  BILITOT 0.6  PROT 7.4  ALBUMIN 4.0      Recent Labs (last 2 labs)      Recent Labs  08/29/14 0910  LIPASE 26     CBC:  Recent Labs (last 2 labs)      Recent Labs  08/29/14 0910  WBC 12.6*  NEUTROABS 9.6*  HGB 15.4*  HCT 44.6  MCV 92.3  PLT 311     Studies:   EXAM: ACUTE ABDOMEN SERIES (ABDOMEN 2 VIEW & CHEST 1 VIEW)  COMPARISON: None.  FINDINGS: There is no evidence of dilated bowel loops or free intraperitoneal air. Moderate amount of stool in the ascending colon. No radiopaque calculi or other significant radiographic abnormality is seen. Heart size and mediastinal contours are within normal limits. Both lungs are clear.  IMPRESSION: Negative abdominal radiographs. No acute cardiopulmonary disease.   HOSPITAL COURSE:  The patient's hospital course by problem addressed it below.   Nause/Vomiting/Diarrhea:  Patient was admitted with presumptive diagnosis of post-obstructive(Obstruction due to constipation) diarrhea given x-ray findings of moderate amount of stool in ascending colon. However, could not rule out viral enteritis given nausea and vomiting. Patient was given supportive care with IV hydration, and started on a bowel regimen of Sennekot, Miralax, and mag citrate to help with abdominal pain and constipation. Over the course of 48 hrs, her diarrhea resolved, and patient had a bowel movement that led to significant relief of her abdominal pain. She was able to tolerate a normal diet. She was therefore [facility]   Hx. Of Opioid Abuse: The patient admitted  to a history of opioid abuse, and reported that she was currently under treatment at a methadone clinic. The medical staff confirmed with the [facility] treatment center that the patient was indeed a patient in their methadone clinic there, and her dose of 80 mg of methadone a day was continued on admission and on discharge.   DISCHARGE DATA: Vital Signs: BP 110/67 mmHg  Pulse 59  Temp(Src) 98.9 F (37.2 C) (Oral)  Resp 16  Ht  (1.473 m)  Wt 60.1 kg (132 lb 7.9 oz)  BMI 27.70 kg/m2  SpO2 97%  LMP 08/27/2014 (Exact Date)  Labs: No results found for this or any previous visit (from the past 24 hour(s)).  Time spent on discharge: 35 minutes  Services Ordered on Discharge: Y = Yes; Blank = No PT:   OT:   RN:   Equipment:   Other:     Inocente Salles PGY 2 Internal Medicine Resident 09/06/2014, 9:10 AM

## 2014-08-31 NOTE — Progress Notes (Signed)
Subjective: Has not had a bowel movement since admission. Tolerating PO- has been on full liquid diet. Ready to try regular.   Objective: Vital signs in last 24 hours: Filed Vitals:   08/30/14 0525 08/30/14 1349 08/30/14 2236 08/31/14 0613  BP: 116/63 124/84 108/63 110/67  Pulse: 66 87  59  Temp: 98.3 F (36.8 C) 98.1 F (36.7 C)  98.9 F (37.2 C)  TempSrc: Oral Oral  Oral  Resp: 16 16 17 16   Height:      Weight:      SpO2: 94% 100%  97%   Weight change:   Intake/Output Summary (Last 24 hours) at 08/31/14 0754 Last data filed at 08/31/14 0700  Gross per 24 hour  Intake 4008.33 ml  Output    350 ml  Net 3658.33 ml   General appearance: lying in bed, alert, cooperative and no distress Head: Normocephalic, without obvious abnormality, atraumatic Lungs: clear to auscultation bilaterally Heart: regular rate and rhythm, S1, S2 normal, no murmur, click, rub or gallop Abdomen: soft, full, mildly distended, bowel sounds present, no masses, no organomegaly Extremities: extremities normal, atraumatic, no cyanosis or edema  Lab Results: Basic Metabolic Panel:  Recent Labs Lab 08/29/14 0910 08/30/14 0535  NA 134* 135  K 3.9 3.9  CL 98 105  CO2 26 25  GLUCOSE 100* 90  BUN 16 9  CREATININE 0.79 0.75  CALCIUM 9.3 8.4   Liver Function Tests:  Recent Labs Lab 08/29/14 0910 08/30/14 0535  AST 191* 145*  ALT 158* 115*  ALKPHOS 103 77  BILITOT 0.6 0.5  PROT 7.4 5.8*  ALBUMIN 4.0 3.2*    Recent Labs Lab 08/29/14 0910  LIPASE 26   CBC:  Recent Labs Lab 08/29/14 0910 08/30/14 0535  WBC 12.6* 9.2  NEUTROABS 9.6*  --   HGB 15.4* 13.5  HCT 44.6 39.3  MCV 92.3 92.0  PLT 311 241   Urine Drug Screen: Drugs of Abuse     Component Value Date/Time   LABOPIA POSITIVE* 08/29/2014 0955   COCAINSCRNUR NONE DETECTED 08/29/2014 0955   LABBENZ NONE DETECTED 08/29/2014 0955   AMPHETMU NONE DETECTED 08/29/2014 0955   THCU NONE DETECTED 08/29/2014 0955   LABBARB  NONE DETECTED 08/29/2014 0955    Urinalysis:  Recent Labs Lab 08/29/14 0955  COLORURINE AMBER*  LABSPEC 1.029  PHURINE 6.0  GLUCOSEU NEGATIVE  HGBUR NEGATIVE  BILIRUBINUR SMALL*  KETONESUR >80*  PROTEINUR NEGATIVE  UROBILINOGEN 1.0  NITRITE NEGATIVE  LEUKOCYTESUR SMALL*   Micro Results: Recent Results (from the past 240 hour(s))  MRSA PCR Screening     Status: None   Collection Time: 08/29/14  6:22 PM  Result Value Ref Range Status   MRSA by PCR NEGATIVE NEGATIVE Final    Comment:        The GeneXpert MRSA Assay (FDA approved for NASAL specimens only), is one component of a comprehensive MRSA colonization surveillance program. It is not intended to diagnose MRSA infection nor to guide or monitor treatment for MRSA infections.    Studies/Results: Dg Abd Acute W/chest  08/29/2014   CLINICAL DATA:  Abdominal pain, emesis, diarrhea  EXAM: ACUTE ABDOMEN SERIES (ABDOMEN 2 VIEW & CHEST 1 VIEW)  COMPARISON:  None.  FINDINGS: There is no evidence of dilated bowel loops or free intraperitoneal air. Moderate amount of stool in the ascending colon. No radiopaque calculi or other significant radiographic abnormality is seen. Heart size and mediastinal contours are within normal limits. Both lungs are clear.  IMPRESSION: Negative abdominal radiographs.  No acute cardiopulmonary disease.   Electronically Signed   By: Elige Ko   On: 08/29/2014 10:38   Medications: I have reviewed the patient's current medications. Scheduled Meds: . heparin  5,000 Units Subcutaneous 3 times per day  . magnesium citrate  0.5 Bottle Oral BID  . methadone  80 mg Oral Daily  . nicotine  14 mg Transdermal Daily  . pantoprazole  40 mg Oral Daily  . senna-docusate  2 tablet Oral BID  . sodium chloride  3 mL Intravenous Q12H   Continuous Infusions:   PRN Meds:.oxyCODONE Assessment/Plan:  Intractable vomiting:  Per Abd Xray, has increased stool burden with no obstruction but perhaps early ileus that  could be a cause for her vomiting thought viral enteritis is still highly likely.  - LFTs this am elevated, but has Hx of hep c Infection, ensure follow up on discharge.  - reduce rate form NSL 120ml/h to 75 cc/hr.  -Advance to Regular diet -Zofran PRN for nausea -Protonix  daily - Follow up arranged with wellness center for Monday. Hopefully discharge today.   Abdominal pain: No signs of acute abdomen/peritonitis. Abd Xray with increased stool burden but no osbtructrion concerning for early ileus.   Chronic Methadone therapy- For past opioid use. Confirmed dose of methadone from patients clinic.  - Methadone-  daily  Diarrhea:  Not uncommon in patient with fecal obstruction, due to passage of liquid stool around fecaes, from bacteria acting on feaces.  -Tap water enema- was not effective, not surprising considering stool is in the ascending colon.  - On Mag citrate  - Miralax- cont  - Sennakot- 2 tabs BID.  -avoid narcotic -pt encouraged to follow up with her new PCP at the [facility] for GI referral (she wants a new GI physician but has seen Dr. Rhea Belton in the past)   Transaminitis: She reports being told that she had Hep C two months ago while in a hospital in [place], [place]. Her LFTs are mildly elevated but have been so for months and appear to be stable. This could be secondary to hepatitis C chronic infection.  -Call hospital in [place], [place] to confirm diagnosis--she may need repeat labs with new PCP and may need a good candidate for tx with Harvoni  - Ct abdomen- 05/29/2014- no liver cirrhosis or masses reported.  DVT prophylaxis: heparin [place] TID  FEN:  NSL 75 ml/h Full liq diet.  Dispo: Disposition is deferred at this time, awaiting improvement of current medical problems.  Anticipated discharge in approximately 1 day(s).   .Services Needed at time of discharge: Y = Yes, Blank = No PT:   OT:   RN:   Equipment:   Other:     LOS: 2 days   Onnie Boer, MD 08/31/2014, 7:54 AM

## 2014-09-02 ENCOUNTER — Ambulatory Visit: Payer: Self-pay | Attending: Internal Medicine | Admitting: Internal Medicine

## 2014-09-02 ENCOUNTER — Encounter: Payer: Self-pay | Admitting: Internal Medicine

## 2014-09-02 VITALS — BP 127/85 | HR 80 | Temp 99.0°F | Resp 16 | Wt 132.0 lb

## 2014-09-02 DIAGNOSIS — J0100 Acute maxillary sinusitis, unspecified: Secondary | ICD-10-CM | POA: Insufficient documentation

## 2014-09-02 DIAGNOSIS — K5900 Constipation, unspecified: Secondary | ICD-10-CM | POA: Insufficient documentation

## 2014-09-02 DIAGNOSIS — F1120 Opioid dependence, uncomplicated: Secondary | ICD-10-CM | POA: Insufficient documentation

## 2014-09-02 DIAGNOSIS — R740 Nonspecific elevation of levels of transaminase and lactic acid dehydrogenase [LDH]: Secondary | ICD-10-CM | POA: Insufficient documentation

## 2014-09-02 DIAGNOSIS — Z87898 Personal history of other specified conditions: Secondary | ICD-10-CM | POA: Insufficient documentation

## 2014-09-02 DIAGNOSIS — Z793 Long term (current) use of hormonal contraceptives: Secondary | ICD-10-CM | POA: Insufficient documentation

## 2014-09-02 DIAGNOSIS — Z8659 Personal history of other mental and behavioral disorders: Secondary | ICD-10-CM

## 2014-09-02 DIAGNOSIS — Z79899 Other long term (current) drug therapy: Secondary | ICD-10-CM | POA: Insufficient documentation

## 2014-09-02 DIAGNOSIS — F17210 Nicotine dependence, cigarettes, uncomplicated: Secondary | ICD-10-CM | POA: Insufficient documentation

## 2014-09-02 DIAGNOSIS — F319 Bipolar disorder, unspecified: Secondary | ICD-10-CM | POA: Insufficient documentation

## 2014-09-02 DIAGNOSIS — Z720 Tobacco use: Secondary | ICD-10-CM

## 2014-09-02 DIAGNOSIS — Z139 Encounter for screening, unspecified: Secondary | ICD-10-CM

## 2014-09-02 DIAGNOSIS — K219 Gastro-esophageal reflux disease without esophagitis: Secondary | ICD-10-CM | POA: Insufficient documentation

## 2014-09-02 DIAGNOSIS — Z833 Family history of diabetes mellitus: Secondary | ICD-10-CM | POA: Insufficient documentation

## 2014-09-02 DIAGNOSIS — F1911 Other psychoactive substance abuse, in remission: Secondary | ICD-10-CM

## 2014-09-02 DIAGNOSIS — F1990 Other psychoactive substance use, unspecified, uncomplicated: Secondary | ICD-10-CM

## 2014-09-02 DIAGNOSIS — R7401 Elevation of levels of liver transaminase levels: Secondary | ICD-10-CM

## 2014-09-02 LAB — CBC WITH DIFFERENTIAL/PLATELET
BASOS ABS: 0 10*3/uL (ref 0.0–0.1)
BASOS PCT: 0 % (ref 0–1)
Eosinophils Absolute: 0.2 10*3/uL (ref 0.0–0.7)
Eosinophils Relative: 2 % (ref 0–5)
HCT: 41.8 % (ref 36.0–46.0)
Hemoglobin: 14.6 g/dL (ref 12.0–15.0)
LYMPHS PCT: 49 % — AB (ref 12–46)
Lymphs Abs: 5.7 10*3/uL — ABNORMAL HIGH (ref 0.7–4.0)
MCH: 32.7 pg (ref 26.0–34.0)
MCHC: 34.9 g/dL (ref 30.0–36.0)
MCV: 93.7 fL (ref 78.0–100.0)
MPV: 10.3 fL (ref 8.6–12.4)
Monocytes Absolute: 1 10*3/uL (ref 0.1–1.0)
Monocytes Relative: 9 % (ref 3–12)
NEUTROS ABS: 4.6 10*3/uL (ref 1.7–7.7)
NEUTROS PCT: 40 % — AB (ref 43–77)
Platelets: 340 10*3/uL (ref 150–400)
RBC: 4.46 MIL/uL (ref 3.87–5.11)
RDW: 12.7 % (ref 11.5–15.5)
WBC: 11.6 10*3/uL — ABNORMAL HIGH (ref 4.0–10.5)

## 2014-09-02 LAB — COMPLETE METABOLIC PANEL WITH GFR
ALK PHOS: 87 U/L (ref 39–117)
ALT: 123 U/L — ABNORMAL HIGH (ref 0–35)
AST: 151 U/L — AB (ref 0–37)
Albumin: 4.3 g/dL (ref 3.5–5.2)
BILIRUBIN TOTAL: 0.3 mg/dL (ref 0.2–1.2)
BUN: 9 mg/dL (ref 6–23)
CO2: 24 mEq/L (ref 19–32)
CREATININE: 0.77 mg/dL (ref 0.50–1.10)
Calcium: 9.7 mg/dL (ref 8.4–10.5)
Chloride: 101 mEq/L (ref 96–112)
GFR, Est African American: >89 mL/min
GFR, Est Non African American: >89 mL/min
Glucose, Bld: 58 mg/dL — ABNORMAL LOW (ref 70–99)
Potassium: 5 mEq/L (ref 3.5–5.3)
Sodium: 138 mEq/L (ref 135–145)
TOTAL PROTEIN: 7 g/dL (ref 6.0–8.3)

## 2014-09-02 LAB — HEMOGLOBIN A1C
HEMOGLOBIN A1C: 5 % (ref <5.7)
Mean Plasma Glucose: 97 mg/dL (ref <117)

## 2014-09-02 LAB — TSH: TSH: 1.104 u[IU]/mL (ref 0.350–4.500)

## 2014-09-02 MED ORDER — DOXYCYCLINE HYCLATE 100 MG PO TABS: 100.0000 mg | ORAL_TABLET | Freq: Two times a day (BID) | ORAL | Status: DC

## 2014-09-02 NOTE — Patient Instructions (Addendum)
Smoking Cessation Quitting smoking is important to your health and has many advantages. However, it is not always easy to quit since nicotine is a very addictive drug. Oftentimes, people try 3 times or more before being able to quit. This document explains the best ways for you to prepare to quit smoking. Quitting takes hard work and a lot of effort, but you can do it. ADVANTAGES OF QUITTING SMOKING  You will live longer, feel better, and live better.  Your body will feel the impact of quitting smoking almost immediately.  Within 20 minutes, blood pressure decreases. Your pulse returns to its normal level.  After 8 hours, carbon monoxide levels in the blood return to normal. Your oxygen level increases.  After 24 hours, the chance of having a heart attack starts to decrease. Your breath, hair, and body stop smelling like smoke.  After 48 hours, damaged nerve endings begin to recover. Your sense of taste and smell improve.  After 72 hours, the body is virtually free of nicotine. Your bronchial tubes relax and breathing becomes easier.  After 2 to 12 weeks, lungs can hold more air. Exercise becomes easier and circulation improves.  The risk of having a heart attack, stroke, cancer, or lung disease is greatly reduced.  After 1 year, the risk of coronary heart disease is cut in half.  After 5 years, the risk of stroke falls to the same as a nonsmoker.  After 10 years, the risk of lung cancer is cut in half and the risk of other cancers decreases significantly.  After 15 years, the risk of coronary heart disease drops, usually to the level of a nonsmoker.  If you are pregnant, quitting smoking will improve your chances of having a healthy baby.  The people you live with, especially any children, will be healthier.  You will have extra money to spend on things other than cigarettes. QUESTIONS TO THINK ABOUT BEFORE ATTEMPTING TO QUIT You may want to talk about your answers with your  health care provider.  Why do you want to quit?  If you tried to quit in the past, what helped and what did not?  What will be the most difficult situations for you after you quit? How will you plan to handle them?  Who can help you through the tough times? Your family? Friends? A health care provider?  What pleasures do you get from smoking? What ways can you still get pleasure if you quit? Here are some questions to ask your health care provider:  How can you help me to be successful at quitting?  What medicine do you think would be best for me and how should I take it?  What should I do if I need more help?  What is smoking withdrawal like? How can I get information on withdrawal? GET READY  Set a quit date.  Change your environment by getting rid of all cigarettes, ashtrays, matches, and lighters in your home, car, or work. Do not let people smoke in your home.  Review your past attempts to quit. Think about what worked and what did not. GET SUPPORT AND ENCOURAGEMENT You have a better chance of being successful if you have help. You can get support in many ways.  Tell your family, friends, and coworkers that you are going to quit and need their support. Ask them not to smoke around you.  Get individual, group, or telephone counseling and support. Programs are available at local hospitals and health centers. Call   your local health department for information about programs in your area.  Spiritual beliefs and practices may help some smokers quit.  Download a "quit meter" on your computer to keep track of quit statistics, such as how long you have gone without smoking, cigarettes not smoked, and money saved.  Get a self-help book about quitting smoking and staying off tobacco. LEARN NEW SKILLS AND BEHAVIORS  Distract yourself from urges to smoke. Talk to someone, go for a walk, or occupy your time with a task.  Change your normal routine. Take a different route to work.  Drink tea instead of coffee. Eat breakfast in a different place.  Reduce your stress. Take a hot bath, exercise, or read a book.  Plan something enjoyable to do every day. Reward yourself for not smoking.  Explore interactive web-based programs that specialize in helping you quit. GET MEDICINE AND USE IT CORRECTLY Medicines can help you stop smoking and decrease the urge to smoke. Combining medicine with the above behavioral methods and support can greatly increase your chances of successfully quitting smoking.  Nicotine replacement therapy helps deliver nicotine to your body without the negative effects and risks of smoking. Nicotine replacement therapy includes nicotine gum, lozenges, inhalers, nasal sprays, and skin patches. Some may be available over-the-counter and others require a prescription.  Antidepressant medicine helps people abstain from smoking, but how this works is unknown. This medicine is available by prescription.  Nicotinic receptor partial agonist medicine simulates the effect of nicotine in your brain. This medicine is available by prescription. Ask your health care provider for advice about which medicines to use and how to use them based on your health history. Your health care provider will tell you what side effects to look out for if you choose to be on a medicine or therapy. Carefully read the information on the package. Do not use any other product containing nicotine while using a nicotine replacement product.  RELAPSE OR DIFFICULT SITUATIONS Most relapses occur within the first 3 months after quitting. Do not be discouraged if you start smoking again. Remember, most people try several times before finally quitting. You may have symptoms of withdrawal because your body is used to nicotine. You may crave cigarettes, be irritable, feel very hungry, cough often, get headaches, or have difficulty concentrating. The withdrawal symptoms are only temporary. They are strongest  when you first quit, but they will go away within 10-14 days. To reduce the chances of relapse, try to:  Avoid drinking alcohol. Drinking lowers your chances of successfully quitting.  Reduce the amount of caffeine you consume. Once you quit smoking, the amount of caffeine in your body increases and can give you symptoms, such as a rapid heartbeat, sweating, and anxiety.  Avoid smokers because they can make you want to smoke.  Do not let weight gain distract you. Many smokers will gain weight when they quit, usually less than 10 pounds. Eat a healthy diet and stay active. You can always lose the weight gained after you quit.  Find ways to improve your mood other than smoking. FOR MORE INFORMATION  www.smokefree.gov  Document Released: 08/03/2001 Document Revised: 12/24/2013 Document Reviewed: 11/18/2011 ExitCare Patient Information 2015 [employer]. This information is not intended to replace advice given to you by your health care provider. Make sure you discuss any questions you have with your health care provider. High-Fiber Diet Fiber is found in fruits, vegetables, and grains. A high-fiber diet encourages the addition of more whole grains, legumes, fruits,   and vegetables in your diet. The recommended amount of fiber for adult males is 38 g per day. For adult females, it is 25 g per day. Pregnant and lactating women should get 28 g of fiber per day. If you have a digestive or bowel problem, ask your caregiver for advice before adding high-fiber foods to your diet. Eat a variety of high-fiber foods instead of only a select few type of foods.  PURPOSE  To increase stool bulk.  To make bowel movements more regular to prevent constipation.  To lower cholesterol.  To prevent overeating. WHEN IS THIS DIET USED?  It may be used if you have constipation and hemorrhoids.  It may be used if you have uncomplicated diverticulosis (intestine condition) and irritable bowel syndrome.  It  may be used if you need help with weight management.  It may be used if you want to add it to your diet as a protective measure against atherosclerosis, diabetes, and cancer. SOURCES OF FIBER  Whole-grain breads and cereals.  Fruits, such as apples, oranges, bananas, berries, prunes, and pears.  Vegetables, such as green peas, carrots, sweet potatoes, beets, broccoli, cabbage, spinach, and artichokes.  Legumes, such split peas, soy, lentils.  Almonds. FIBER CONTENT IN FOODS Starches and Grains / Dietary Fiber (g)  Cheerios, 1 cup / 3 g  Corn Flakes cereal, 1 cup / 0.7 g  Rice crispy treat cereal, 1 cup / 0.3 g  Instant oatmeal (cooked),  cup / 2 g  Frosted wheat cereal, 1 cup / 5.1 g  Brown, long-grain rice (cooked), 1 cup / 3.5 g  White, long-grain rice (cooked), 1 cup / 0.6 g  Enriched macaroni (cooked), 1 cup / 2.5 g Legumes / Dietary Fiber (g)  Baked beans (canned, plain, or vegetarian),  cup / 5.2 g  Kidney beans (canned),  cup / 6.8 g  Pinto beans (cooked),  cup / 5.5 g Breads and Crackers / Dietary Fiber (g)  Plain or honey graham crackers, 2 squares / 0.7 g  Saltine crackers, 3 squares / 0.3 g  Plain, salted pretzels, 10 pieces / 1.8 g  Whole-wheat bread, 1 slice / 1.9 g  White bread, 1 slice / 0.7 g  Raisin bread, 1 slice / 1.2 g  Plain bagel, 3 oz / 2 g  Flour tortilla, 1 oz / 0.9 g  Corn tortilla, 1 small / 1.5 g  Hamburger or hotdog bun, 1 small / 0.9 g Fruits / Dietary Fiber (g)  Apple with skin, 1 medium / 4.4 g  Sweetened applesauce,  cup / 1.5 g  Banana,  medium / 1.5 g  Grapes, 10 grapes / 0.4 g  Orange, 1 small / 2.3 g  Raisin, 1.5 oz / 1.6 g  Melon, 1 cup / 1.4 g Vegetables / Dietary Fiber (g)  Green beans (canned),  cup / 1.3 g  Carrots (cooked),  cup / 2.3 g  Broccoli (cooked),  cup / 2.8 g  Peas (cooked),  cup / 4.4 g  Mashed potatoes,  cup / 1.6 g  Lettuce, 1 cup / 0.5 g  Corn (canned),  cup /  1.6 g  Tomato,  cup / 1.1 g Document Released: 08/09/2005 Document Revised: 02/08/2012 Document Reviewed: 11/11/2011 ExitCare Patient Information 2015 [employer]. This information is not intended to replace advice given to you by your health care provider. Make sure you discuss any questions you have with your health care provider.  

## 2014-09-02 NOTE — Progress Notes (Signed)
Patient Demographics  Amy Walls, is a 34 y.o. female  EAV:[id number]:[id number]  NWG:[id number]  DOB - 04-Aug-1981  CC:  Chief Complaint  Patient presents with  . Hospitalization Follow-up       HPI: Amy Walls is a 34 y.o. female here today to establish medical care. 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 Patient does report prior history of bipolar disorder and she has not seen psychiatrist recently. Patient does complain of nasal congestion postnasal drip for the last 2 weeks and symptoms are not getting better. Patient has No headache, No chest pain, No abdominal pain - No Nausea, No new weakness tingling or numbness, No Cough - SOB.  Allergies  Allergen Reactions  . Ciprofloxacin Anaphylaxis  . Levaquin [Levofloxacin] Other (See Comments)    "burns my veins when they put it thru the IV; like veins are on fire"  . Morphine And Related Nausea And Vomiting  . Penicillins Anaphylaxis    "swells up my airways"  . Toradol [Ketorolac Tromethamine] Other (See Comments)    "gave me a shot in my rear and they said it caused paralysis"  . Ultram [Tramadol] Other (See Comments)    "makes my whole body go numb"  . Acetaminophen     Liver issues  . Phenergan [Promethazine Hcl] Hives  . Sulfa Antibiotics Nausea And Vomiting  . Keflex [Cephalexin] Rash   Past Medical History  Diagnosis Date  . Asthma   .  Complication of anesthesia 2007    "lost me on the table when I had upper bowel obstruction"  . PONV (postoperative nausea and vomiting)   . Chronic bronchitis     "yearly"  . Hypoglycemia     "my sugar runs low alot"  . Migraines 12/28/11    "@ least once/wk"  . Recurrent UTI   . Kidney infection     "often"  . Bowel obstruction 2007    "upper bowel"  . Arthritis     "inflammation of all my joints"  . Chronic mid back pain   . Fibromyalgia   . Anxiety   . Depression   . ADHD (attention deficit hyperactivity disorder)    Current Outpatient Prescriptions on File Prior to Visit  Medication Sig Dispense Refill  . etonogestrel (IMPLANON) 68 MG IMPL implant 1 each by Subdermal route once.    . gabapentin (NEURONTIN) 600 MG tablet Take 1 tablet (600 mg total) by mouth 2 (two) times daily. 60 tablet 0  . magnesium citrate SOLN Take 148 mLs (0.5 Bottles total) by mouth daily as needed for severe constipation. 1000 mL 1  . METHADONE HCL PO Take 80 mg by mouth every morning. [facility] [place] [facility]    . nicotine (NICODERM CQ - DOSED IN MG/24 HOURS) 14 mg/24hr patch Place 1 patch (14 mg total) onto the skin daily. 28 patch 0  . Omega-3 Fatty Acids (FISH OIL PO) Take 1  capsule by mouth daily.    . ondansetron (ZOFRAN) 4 MG tablet Take 1 tablet (4 mg total) by mouth every 6 (six) hours. (Patient not taking: Reported on 08/17/2014) 12 tablet 0  . oxycodone (OXY-IR) 5 MG capsule Take 1 capsule (5 mg total) by mouth every 4 (four) hours as needed. (Patient not taking: Reported on 08/29/2014) 8 capsule 0  . pantoprazole (PROTONIX) 40 MG tablet Take 1 tablet (40 mg total) by mouth daily. 14 tablet 1  . polyethylene glycol (MIRALAX / GLYCOLAX) packet Take 17 g by mouth 2 (two) times daily. 14 each 0  . senna-docusate (SENOKOT-S) 8.6-50 MG per tablet Take 2 tablets by mouth 2 (two) times daily. 120 tablet 1  . zolpidem (AMBIEN) 5 MG tablet Take 1 tablet (5 mg total) by mouth at bedtime  as needed for sleep. 10 tablet 0   No current facility-administered medications on file prior to visit.   Family History  Problem Relation Age of Onset  . Breast cancer Maternal Grandmother   . Colon cancer Maternal Grandmother   . Irritable bowel syndrome Maternal Grandmother   . Diabetes Mother   . Heart disease Father   . Cancer Maternal Aunt     colon cancer   . Vision loss Maternal Grandfather    History   Social History  . Marital Status: Single    Spouse Name: N/A    Number of Children: 0  . Years of Education: N/A   Occupational History  . unemployeed    Social History Main Topics  . Smoking status: Current Every Day Smoker -- 1.00 packs/day for 16 years    Types: Cigarettes  . Smokeless tobacco: Never Used     Comment: tobacco handout given 01/31/2012  . Alcohol Use: 1.2 oz/week    2 Cans of beer per week  . Drug Use: No     Comment: history of IVDU   . Sexual Activity: Not Currently   Other Topics Concern  . Not on file   Social History Narrative    Review of Systems: Constitutional: Negative for fever, chills, diaphoresis, activity change, appetite change and fatigue. HENT: Negative for ear pain, nosebleeds, congestion, facial swelling, rhinorrhea, neck pain, neck stiffness and ear discharge.  Eyes: Negative for pain, discharge, redness, itching and visual disturbance. Respiratory: Negative for cough, choking, chest tightness, shortness of breath, wheezing and stridor.  Cardiovascular: Negative for chest pain, palpitations and leg swelling. Gastrointestinal: Negative for abdominal distention. Genitourinary: Negative for dysuria, urgency, frequency, hematuria, flank pain, decreased urine volume, difficulty urinating and dyspareunia.  Musculoskeletal: Negative for back pain, joint swelling, arthralgia and gait problem. Neurological: Negative for dizziness, tremors, seizures, syncope, facial asymmetry, speech difficulty, weakness, light-headedness, numbness  and headaches.  Hematological: Negative for adenopathy. Does not bruise/bleed easily. Psychiatric/Behavioral: Negative for hallucinations, behavioral problems, confusion, dysphoric mood, decreased concentration and agitation.    Objective:   Filed Vitals:   09/02/14 1544  BP: 127/85  Pulse: 80  Temp: 99 F (37.2 C)  Resp: 16    Physical Exam: Constitutional: Patient appears well-developed and well-nourished. No distress. HENT: Normocephalic, atraumatic, External right and left ear normal. Oropharynx is clear and moist. Nasal congestion sinus tenderness Eyes: Conjunctivae and EOM are normal. PERRLA, no scleral icterus. Neck: Normal ROM. Neck supple. No JVD. No tracheal deviation. No thyromegaly. CVS: RRR, S1/S2 +, no murmurs, no gallops, no carotid bruit.  Pulmonary: Effort and breath sounds normal, no stridor, rhonchi, wheezes, rales.  Abdominal: Soft. BS +, no  distension, tenderness, rebound or guarding. Old surgical scar. Neuro: Alert. Normal reflexes, muscle tone coordination. No cranial nerve deficit. Skin: Skin is warm and dry. No rash noted. Not diaphoretic. No erythema. No pallor. Psychiatric: Normal mood and affect. Behavior, judgment, thought content normal.  Lab Results  Component Value Date   WBC 9.2 08/30/2014   HGB 13.5 08/30/2014   HCT 39.3 08/30/2014   MCV 92.0 08/30/2014   PLT 241 08/30/2014   Lab Results  Component Value Date   CREATININE 0.75 08/30/2014   BUN 9 08/30/2014   NA 135 08/30/2014   K 3.9 08/30/2014   CL 105 08/30/2014   CO2 25 08/30/2014    No results found for: HGBA1C Lipid Panel  No results found for: CHOL, TRIG, HDL, CHOLHDL, VLDL, LDLCALC     Assessment and plan:   1. Constipation, unspecified constipation type Likely patient being on methadone, her last bowel movement was yesterday, have advised patient to increase fiber in her diet continue with her MiraLAX senna docusate when necessary  2. Tobacco abuse Advise patient to  quit smoking.  3. Transaminitis ?history of hep C will check - Hepatitis B surface antibody - Hepatitis B surface antigen - Hepatitis C antibody - Hepatitis C RNA quantitative  4. Gastroesophageal reflux disease, esophagitis presence not specified Advise patient for lifestyle modification, continue with Protonix  5. History of drug abuse Currently on methadone follows at methadone clinic   6. History of bipolar disorder  - Ambulatory referral to Psychiatry  7. Family history of diabetes mellitus (DM) Will check - Hemoglobin A1c  8. Acute maxillary sinusitis, recurrence not specified  - doxycycline (VIBRA-TABS) 100 MG tablet; Take 1 tablet (100 mg total) by mouth 2 (two) times daily.  Dispense: 20 tablet; Refill: 0  9. Screening Ordered baseline blood work. - CBC with Differential - COMPLETE METABOLIC PANEL WITH GFR - TSH - Vit D  25 hydroxy (rtn osteoporosis monitoring)  Health Maintenance : -Vaccinations:  uptodate with pneumovax   Return in about 3 months (around 12/02/2014).  Doris Cheadle, MD

## 2014-09-02 NOTE — Progress Notes (Signed)
Patient here for follow up from hospital  Was admitted with  GI problems Has a hard time having bowel movements

## 2014-09-03 LAB — HEPATITIS C ANTIBODY: HCV Ab: REACTIVE — AB

## 2014-09-03 LAB — HEPATITIS B SURFACE ANTIGEN: HEP B S AG: NEGATIVE

## 2014-09-03 LAB — HEPATITIS B SURFACE ANTIBODY,QUALITATIVE: Hep B S Ab: POSITIVE — AB

## 2014-09-03 LAB — VITAMIN D 25 HYDROXY (VIT D DEFICIENCY, FRACTURES): Vit D, 25-Hydroxy: 19 ng/mL — ABNORMAL LOW (ref 30–100)

## 2014-09-04 ENCOUNTER — Telehealth: Payer: Self-pay

## 2014-09-04 LAB — HEPATITIS C RNA QUANTITATIVE
HCV Quantitative Log: 5.25 {Log} — ABNORMAL HIGH (ref <1.18)
HCV Quantitative: 176482 IU/mL — ABNORMAL HIGH (ref <15)

## 2014-09-04 NOTE — Telephone Encounter (Signed)
Returned patient call Patient wanted her Remus Lofflerambien [facility] i explained to the patient that we do not carry that medication here She stated she will get it filled at her other pharmacy

## 2014-09-06 ENCOUNTER — Telehealth: Payer: Self-pay

## 2014-09-06 DIAGNOSIS — B1920 Unspecified viral hepatitis C without hepatic coma: Secondary | ICD-10-CM

## 2014-09-06 MED ORDER — VITAMIN D (ERGOCALCIFEROL) 1.25 MG (50000 UNIT) PO CAPS: 50000.0000 [IU] | ORAL_CAPSULE | ORAL | Status: DC

## 2014-09-06 NOTE — Telephone Encounter (Signed)
Patient not available Left message on voice mail to return our call Referral to ID placed in Epic

## 2014-09-06 NOTE — Telephone Encounter (Signed)
-----   Message from Doris Cheadleeepak Advani, MD sent at 09/04/2014  5:58 PM EST ----- Blood work reviewed, noticed low vitamin D, call patient advise to start ergocalciferol 50,000 units once a week for the duration of  12 weeks. Also patient has hepatitis C, patient needs  to see a specialist,put in the referral to infectious disease for evaluation and  treatment.

## 2014-09-17 ENCOUNTER — Other Ambulatory Visit: Payer: Self-pay

## 2014-10-07 ENCOUNTER — Other Ambulatory Visit: Payer: Self-pay

## 2014-10-29 ENCOUNTER — Telehealth: Payer: Self-pay | Admitting: *Deleted

## 2014-10-29 NOTE — Telephone Encounter (Signed)
Patient no showed his lab appointment x 2 to be established for Hep C treatment. He will need a new referral from PCP.

## 2015-03-07 ENCOUNTER — Emergency Department (HOSPITAL_COMMUNITY)
Admission: EM | Admit: 2015-03-07 | Discharge: 2015-03-07 | Disposition: A | Discharge status: Home / Self Care | Payer: Self-pay | Attending: Emergency Medicine | Admitting: Emergency Medicine

## 2015-03-07 ENCOUNTER — Encounter (HOSPITAL_COMMUNITY): Payer: Self-pay | Admitting: *Deleted

## 2015-03-07 DIAGNOSIS — G8929 Other chronic pain: Secondary | ICD-10-CM | POA: Insufficient documentation

## 2015-03-07 DIAGNOSIS — F419 Anxiety disorder, unspecified: Secondary | ICD-10-CM | POA: Insufficient documentation

## 2015-03-07 DIAGNOSIS — Z880 Allergy status to penicillin: Secondary | ICD-10-CM | POA: Insufficient documentation

## 2015-03-07 DIAGNOSIS — Z720 Tobacco use: Secondary | ICD-10-CM | POA: Insufficient documentation

## 2015-03-07 DIAGNOSIS — J45909 Unspecified asthma, uncomplicated: Secondary | ICD-10-CM | POA: Insufficient documentation

## 2015-03-07 DIAGNOSIS — F329 Major depressive disorder, single episode, unspecified: Secondary | ICD-10-CM | POA: Insufficient documentation

## 2015-03-07 DIAGNOSIS — Z3202 Encounter for pregnancy test, result negative: Secondary | ICD-10-CM | POA: Insufficient documentation

## 2015-03-07 DIAGNOSIS — G43909 Migraine, unspecified, not intractable, without status migrainosus: Secondary | ICD-10-CM | POA: Insufficient documentation

## 2015-03-07 DIAGNOSIS — M797 Fibromyalgia: Secondary | ICD-10-CM | POA: Insufficient documentation

## 2015-03-07 DIAGNOSIS — M1990 Unspecified osteoarthritis, unspecified site: Secondary | ICD-10-CM | POA: Insufficient documentation

## 2015-03-07 DIAGNOSIS — Z79899 Other long term (current) drug therapy: Secondary | ICD-10-CM | POA: Insufficient documentation

## 2015-03-07 DIAGNOSIS — N390 Urinary tract infection, site not specified: Secondary | ICD-10-CM | POA: Insufficient documentation

## 2015-03-07 LAB — BASIC METABOLIC PANEL
Anion gap: 8 (ref 5–15)
BUN: 10 mg/dL (ref 6–20)
CO2: 24 mmol/L (ref 22–32)
CREATININE: 0.79 mg/dL (ref 0.44–1.00)
Calcium: 9.3 mg/dL (ref 8.9–10.3)
Chloride: 105 mmol/L (ref 101–111)
GFR calc non Af Amer: >60 mL/min (ref >60)
Glucose, Bld: 96 mg/dL (ref 65–99)
POTASSIUM: 4.1 mmol/L (ref 3.5–5.1)
Sodium: 137 mmol/L (ref 135–145)

## 2015-03-07 LAB — CBC WITH DIFFERENTIAL/PLATELET
BASOS PCT: 0 % (ref 0–1)
Basophils Absolute: 0 10*3/uL (ref 0.0–0.1)
Eosinophils Absolute: 0.1 10*3/uL (ref 0.0–0.7)
Eosinophils Relative: 1 % (ref 0–5)
HCT: 44.8 % (ref 36.0–46.0)
HEMOGLOBIN: 14.8 g/dL (ref 12.0–15.0)
Lymphocytes Relative: 27 % (ref 12–46)
Lymphs Abs: 2.8 10*3/uL (ref 0.7–4.0)
MCH: 29.3 pg (ref 26.0–34.0)
MCHC: 33 g/dL (ref 30.0–36.0)
MCV: 88.7 fL (ref 78.0–100.0)
MONO ABS: 0.9 10*3/uL (ref 0.1–1.0)
Monocytes Relative: 9 % (ref 3–12)
NEUTROS PCT: 63 % (ref 43–77)
Neutro Abs: 6.5 10*3/uL (ref 1.7–7.7)
PLATELETS: 246 10*3/uL (ref 150–400)
RBC: 5.05 MIL/uL (ref 3.87–5.11)
RDW: 13 % (ref 11.5–15.5)
WBC: 10.4 10*3/uL (ref 4.0–10.5)

## 2015-03-07 LAB — URINALYSIS, ROUTINE W REFLEX MICROSCOPIC
Bilirubin Urine: NEGATIVE
Glucose, UA: NEGATIVE mg/dL
Ketones, ur: NEGATIVE mg/dL
NITRITE: POSITIVE — AB
PH: 6.5 (ref 5.0–8.0)
Protein, ur: NEGATIVE mg/dL
SPECIFIC GRAVITY, URINE: 1.011 (ref 1.005–1.030)
UROBILINOGEN UA: 1 mg/dL (ref 0.0–1.0)

## 2015-03-07 LAB — URINE MICROSCOPIC-ADD ON

## 2015-03-07 LAB — PREGNANCY, URINE: PREG TEST UR: NEGATIVE

## 2015-03-07 MED ORDER — METOCLOPRAMIDE HCL 10 MG PO TABS: 10.0000 mg | ORAL_TABLET | Freq: Four times a day (QID) | ORAL | Status: DC

## 2015-03-07 MED ORDER — ONDANSETRON HCL 4 MG PO TABS: 4.0000 mg | ORAL_TABLET | Freq: Four times a day (QID) | ORAL | Status: DC

## 2015-03-07 MED ORDER — ONDANSETRON 4 MG PO TBDP
8.0000 mg | ORAL_TABLET | Freq: Once | ORAL | Status: AC
  Administered 2015-03-07: 8 mg via ORAL
  Filled 2015-03-07: qty 2

## 2015-03-07 MED ORDER — NITROFURANTOIN MONOHYD MACRO 100 MG PO CAPS: 100.0000 mg | ORAL_CAPSULE | Freq: Two times a day (BID) | ORAL | Status: DC

## 2015-03-07 MED ORDER — OXYCODONE HCL 5 MG PO TABS
5.0000 mg | ORAL_TABLET | Freq: Once | ORAL | Status: AC
  Administered 2015-03-07: 5 mg via ORAL
  Filled 2015-03-07: qty 1

## 2015-03-07 MED ORDER — PHENAZOPYRIDINE HCL 100 MG PO TABS
200.0000 mg | ORAL_TABLET | Freq: Three times a day (TID) | ORAL | Status: DC
  Administered 2015-03-07: 200 mg via ORAL
  Filled 2015-03-07: qty 2

## 2015-03-07 MED ORDER — NITROFURANTOIN MONOHYD MACRO 100 MG PO CAPS
100.0000 mg | ORAL_CAPSULE | Freq: Once | ORAL | Status: AC
  Administered 2015-03-07: 100 mg via ORAL
  Filled 2015-03-07: qty 1

## 2015-03-07 MED ORDER — OXYCODONE HCL 5 MG PO TABS: 5.0000 mg | ORAL_TABLET | ORAL | Status: DC | PRN

## 2015-03-07 MED ORDER — OXYCODONE HCL 5 MG PO TABS
10.0000 mg | ORAL_TABLET | Freq: Once | ORAL | Status: AC
  Administered 2015-03-07: 10 mg via ORAL
  Filled 2015-03-07: qty 2

## 2015-03-07 NOTE — ED Provider Notes (Signed)
CSN: [id number]     Arrival date & time 03/07/15  1016 History   First MD Initiated Contact with Patient 03/07/15 1040     Chief Complaint  Patient presents with  . Hematuria  . Back Pain     (Consider location/radiation/quality/duration/timing/severity/associated sxs/prior Treatment) HPI Comments: Patient with a history of kidney stones, UTI, polysubstance abuse, hepatitis, IBS presents with symptoms of dysuria, hematuria, frequency, "feels like I am peeing razor blades.". She is having pain in the left flank. She reports fever, chills, vomiting.   The history is provided by the patient. No language interpreter was used.    Past Medical History  Diagnosis Date  . Asthma   . Complication of anesthesia 2007    "lost me on the table when I had upper bowel obstruction"  . PONV (postoperative nausea and vomiting)   . Chronic bronchitis     "yearly"  . Hypoglycemia     "my sugar runs low alot"  . Migraines 12/28/11    "@ least once/wk"  . Recurrent UTI   . Kidney infection     "often"  . Bowel obstruction 2007    "upper bowel"  . Arthritis     "inflammation of all my joints"  . Chronic mid back pain   . Fibromyalgia   . Anxiety   . Depression   . ADHD (attention deficit hyperactivity disorder)    Past Surgical History  Procedure Laterality Date  . Bowel resection  04/2006    "upper bowel"  . Ovary surgery  ~ 2002    "had 10 inches blood built up; they opened me up and got the blood out"   Family History  Problem Relation Age of Onset  . Breast cancer Maternal Grandmother   . Colon cancer Maternal Grandmother   . Irritable bowel syndrome Maternal Grandmother   . Diabetes Mother   . Heart disease Father   . Cancer Maternal Aunt     colon cancer   . Vision loss Maternal Grandfather    History  Substance Use Topics  . Smoking status: Current Every Day Smoker -- 1.00 packs/day for 16 years    Types: Cigarettes  . Smokeless tobacco: Never Used     Comment: tobacco  handout given 01/31/2012  . Alcohol Use: 1.2 oz/week    2 Cans of beer per week   OB History    No data available     Review of Systems  Constitutional: Negative for fever and chills.  Respiratory: Negative.   Cardiovascular: Negative.   Gastrointestinal: Negative.   Genitourinary: Positive for dysuria, frequency, hematuria and flank pain.  Musculoskeletal: Negative.   Skin: Negative.   Neurological: Negative.       Allergies  Cinnamon; Ciprofloxacin; Levaquin; Morphine and related; Penicillins; Toradol; Ultram; Acetaminophen; Phenergan; Sulfa antibiotics; and Keflex  Home Medications   Prior to Admission medications   Medication Sig Start Date End Date Taking? Authorizing Provider  Aspirin-Salicylamide-Caffeine (BC HEADACHE POWDER PO) Take 1 packet by mouth daily as needed (pain).   Yes Historical Provider, MD  etonogestrel (IMPLANON) 68 MG IMPL implant 1 each by Subdermal route once.   Yes Historical Provider, MD  gabapentin (NEURONTIN) 300 MG capsule Take 300 mg by mouth 3 (three) times daily.   Yes Historical Provider, MD  QUEtiapine (SEROQUEL) 50 MG tablet Take 50 mg by mouth at bedtime.   Yes Historical Provider, MD  doxycycline (VIBRA-TABS) 100 MG tablet Take 1 tablet (100 mg total) by mouth 2 (two) times  daily. Patient not taking: Reported on 03/07/2015 09/02/14   Doris Cheadle, MD  magnesium citrate SOLN Take 148 mLs (0.5 Bottles total) by mouth daily as needed for severe constipation. Patient not taking: Reported on 03/07/2015 08/31/14   Ejiroghene Wendall Stade, MD  nicotine (NICODERM CQ - DOSED IN MG/24 HOURS) 14 mg/24hr patch Place 1 patch (14 mg total) onto the skin daily. Patient not taking: Reported on 03/07/2015 08/31/14   Ejiroghene Wendall Stade, MD  pantoprazole (PROTONIX) 40 MG tablet Take 1 tablet (40 mg total) by mouth daily. Patient not taking: Reported on 03/07/2015 08/31/14   Ejiroghene Wendall Stade, MD  polyethylene glycol (MIRALAX / GLYCOLAX) packet Take 17 g by mouth 2  (two) times daily. Patient not taking: Reported on 03/07/2015 08/31/14   Ejiroghene E Mariea Clonts, MD  senna-docusate (SENOKOT-S) 8.6-50 MG per tablet Take 2 tablets by mouth 2 (two) times daily. Patient not taking: Reported on 03/07/2015 08/31/14   Ejiroghene Wendall Stade, MD  Vitamin D, Ergocalciferol, (DRISDOL) 50000 UNITS CAPS capsule Take 1 capsule (50,000 Units total) by mouth every 7 (seven) days. Patient not taking: Reported on 03/07/2015 09/06/14   Doris Cheadle, MD  zolpidem (AMBIEN) 5 MG tablet Take 1 tablet (5 mg total) by mouth at bedtime as needed for sleep. Patient not taking: Reported on 03/07/2015 08/31/14   Ejiroghene E Emokpae, MD   BP 101/59 mmHg  Pulse 72  Temp(Src) 98.1 F (36.7 C) (Oral)  Resp 16  SpO2 95%  LMP 02/21/2015 Physical Exam  Constitutional: She is oriented to person, place, and time. She appears well-developed and well-nourished.  HENT:  Head: Normocephalic.  Neck: Normal range of motion. Neck supple.  Cardiovascular: Normal rate and regular rhythm.   Pulmonary/Chest: Effort normal and breath sounds normal. She has no wheezes. She has no rales.  Abdominal: Soft. Bowel sounds are normal. There is tenderness. There is no rebound and no guarding.  Diffusely tender across lower abdomen. "Makes me feel like I have to pee".   Genitourinary:  Left flank tenderness.   Musculoskeletal: Normal range of motion.  Neurological: She is alert and oriented to person, place, and time.  Skin: Skin is warm and dry. No rash noted.  Psychiatric: She has a normal mood and affect.    ED Course  Procedures (including critical care time) Labs Review Labs Reviewed  URINALYSIS, ROUTINE W REFLEX MICROSCOPIC (NOT AT [facility]) - Abnormal; Notable for the following:    APPearance CLOUDY (*)    Hgb urine dipstick SMALL (*)    Nitrite POSITIVE (*)    Leukocytes, UA LARGE (*)    All other components within normal limits  URINE MICROSCOPIC-ADD ON - Abnormal; Notable for the following:     Bacteria, UA FEW (*)    All other components within normal limits  PREGNANCY, URINE  CBC WITH DIFFERENTIAL/PLATELET  BASIC METABOLIC PANEL   Results for orders placed or performed during the hospital encounter of 03/07/15  Urinalysis, Routine w reflex microscopic-may I&O cath if menses (not at [facility])  Result Value Ref Range   Color, Urine YELLOW YELLOW   APPearance CLOUDY (A) CLEAR   Specific Gravity, Urine 1.011 1.005 - 1.030   pH 6.5 5.0 - 8.0   Glucose, UA NEGATIVE NEGATIVE mg/dL   Hgb urine dipstick SMALL (A) NEGATIVE   Bilirubin Urine NEGATIVE NEGATIVE   Ketones, ur NEGATIVE NEGATIVE mg/dL   Protein, ur NEGATIVE NEGATIVE mg/dL   Urobilinogen, UA 1.0 0.0 - 1.0 mg/dL   Nitrite POSITIVE (A) NEGATIVE  Leukocytes, UA LARGE (A) NEGATIVE  Pregnancy, urine  Result Value Ref Range   Preg Test, Ur NEGATIVE NEGATIVE  CBC with Differential  Result Value Ref Range   WBC 10.4 4.0 - 10.5 K/uL   RBC 5.05 3.87 - 5.11 MIL/uL   Hemoglobin 14.8 12.0 - 15.0 g/dL   HCT 81.1 91.4 - 78.2 %   MCV 88.7 78.0 - 100.0 fL   MCH 29.3 26.0 - 34.0 pg   MCHC 33.0 30.0 - 36.0 g/dL   RDW 95.6 21.3 - 08.6 %   Platelets 246 150 - 400 K/uL   Neutrophils Relative % 63 43 - 77 %   Neutro Abs 6.5 1.7 - 7.7 K/uL   Lymphocytes Relative 27 12 - 46 %   Lymphs Abs 2.8 0.7 - 4.0 K/uL   Monocytes Relative 9 3 - 12 %   Monocytes Absolute 0.9 0.1 - 1.0 K/uL   Eosinophils Relative 1 0 - 5 %   Eosinophils Absolute 0.1 0.0 - 0.7 K/uL   Basophils Relative 0 0 - 1 %   Basophils Absolute 0.0 0.0 - 0.1 K/uL  Urine microscopic-add on  Result Value Ref Range   Squamous Epithelial / LPF RARE RARE   WBC, UA TOO NUMEROUS TO COUNT <3 WBC/hpf   RBC / HPF 3-6 <3 RBC/hpf   Bacteria, UA FEW (A) RARE    Imaging Review No results found.   EKG Interpretation None      MDM   Final diagnoses:  None    1. UTI  The patient has multiple antibiotic allergies. Chart reviewed. Previous urine culture sensitive to  macrobid, which fits with the patient's allergy profile. She is given Zofran for nausea and is tolerating PO fluids. No vomiting in the room.   Minimal blood in urine, no crystals. Last CT scan 04-2014 negative or stones. No CT repeated here today.  The patient's VS are stable. She can be discharged home    Elpidio Anis, [place] 03/07/15 1254  Cathren Laine, MD 03/07/15 1340

## 2015-03-07 NOTE — Discharge Instructions (Signed)

## 2015-03-07 NOTE — ED Notes (Signed)
Pt reports back pain and blood in urine for 2 days pt also reports n/v/fever/chills. Pt has hx of kidney stones and states that this feels the same.

## 2015-11-11 ENCOUNTER — Encounter (HOSPITAL_BASED_OUTPATIENT_CLINIC_OR_DEPARTMENT_OTHER): Payer: Self-pay | Admitting: Emergency Medicine

## 2015-11-11 ENCOUNTER — Emergency Department (HOSPITAL_BASED_OUTPATIENT_CLINIC_OR_DEPARTMENT_OTHER): Payer: Self-pay

## 2015-11-11 ENCOUNTER — Emergency Department (HOSPITAL_BASED_OUTPATIENT_CLINIC_OR_DEPARTMENT_OTHER)
Admission: EM | Admit: 2015-11-11 | Discharge: 2015-11-11 | Disposition: A | Discharge status: Home / Self Care | Payer: Self-pay | Attending: Physician Assistant | Admitting: Physician Assistant

## 2015-11-11 DIAGNOSIS — F419 Anxiety disorder, unspecified: Secondary | ICD-10-CM | POA: Insufficient documentation

## 2015-11-11 DIAGNOSIS — F17210 Nicotine dependence, cigarettes, uncomplicated: Secondary | ICD-10-CM | POA: Insufficient documentation

## 2015-11-11 DIAGNOSIS — Z8719 Personal history of other diseases of the digestive system: Secondary | ICD-10-CM | POA: Insufficient documentation

## 2015-11-11 DIAGNOSIS — Z8739 Personal history of other diseases of the musculoskeletal system and connective tissue: Secondary | ICD-10-CM | POA: Insufficient documentation

## 2015-11-11 DIAGNOSIS — N390 Urinary tract infection, site not specified: Secondary | ICD-10-CM | POA: Insufficient documentation

## 2015-11-11 DIAGNOSIS — Z3202 Encounter for pregnancy test, result negative: Secondary | ICD-10-CM | POA: Insufficient documentation

## 2015-11-11 DIAGNOSIS — G8929 Other chronic pain: Secondary | ICD-10-CM | POA: Insufficient documentation

## 2015-11-11 DIAGNOSIS — Z8639 Personal history of other endocrine, nutritional and metabolic disease: Secondary | ICD-10-CM | POA: Insufficient documentation

## 2015-11-11 DIAGNOSIS — J45909 Unspecified asthma, uncomplicated: Secondary | ICD-10-CM | POA: Insufficient documentation

## 2015-11-11 DIAGNOSIS — Z880 Allergy status to penicillin: Secondary | ICD-10-CM | POA: Insufficient documentation

## 2015-11-11 DIAGNOSIS — Z8619 Personal history of other infectious and parasitic diseases: Secondary | ICD-10-CM | POA: Insufficient documentation

## 2015-11-11 DIAGNOSIS — F329 Major depressive disorder, single episode, unspecified: Secondary | ICD-10-CM | POA: Insufficient documentation

## 2015-11-11 DIAGNOSIS — R112 Nausea with vomiting, unspecified: Secondary | ICD-10-CM | POA: Insufficient documentation

## 2015-11-11 HISTORY — DX: Other constipation: K59.09

## 2015-11-11 HISTORY — DX: Unspecified viral hepatitis C without hepatic coma: B19.20

## 2015-11-11 LAB — URINE MICROSCOPIC-ADD ON

## 2015-11-11 LAB — CBC WITH DIFFERENTIAL/PLATELET
BASOS PCT: 0 %
Basophils Absolute: 0 10*3/uL (ref 0.0–0.1)
EOS PCT: 1 %
Eosinophils Absolute: 0.1 10*3/uL (ref 0.0–0.7)
HCT: 46.4 % — ABNORMAL HIGH (ref 36.0–46.0)
Hemoglobin: 15.6 g/dL — ABNORMAL HIGH (ref 12.0–15.0)
Lymphocytes Relative: 37 %
Lymphs Abs: 4.1 10*3/uL — ABNORMAL HIGH (ref 0.7–4.0)
MCH: 28.9 pg (ref 26.0–34.0)
MCHC: 33.6 g/dL (ref 30.0–36.0)
MCV: 86.1 fL (ref 78.0–100.0)
MONO ABS: 0.9 10*3/uL (ref 0.1–1.0)
Monocytes Relative: 8 %
NEUTROS ABS: 6.1 10*3/uL (ref 1.7–7.7)
Neutrophils Relative %: 54 %
Platelets: 287 10*3/uL (ref 150–400)
RBC: 5.39 MIL/uL — ABNORMAL HIGH (ref 3.87–5.11)
RDW: 12.8 % (ref 11.5–15.5)
WBC: 11.1 10*3/uL — ABNORMAL HIGH (ref 4.0–10.5)

## 2015-11-11 LAB — COMPREHENSIVE METABOLIC PANEL
ALBUMIN: 4.2 g/dL (ref 3.5–5.0)
ALT: 136 U/L — ABNORMAL HIGH (ref 14–54)
ANION GAP: 10 (ref 5–15)
AST: 157 U/L — ABNORMAL HIGH (ref 15–41)
Alkaline Phosphatase: 101 U/L (ref 38–126)
BILIRUBIN TOTAL: 0.7 mg/dL (ref 0.3–1.2)
BUN: 15 mg/dL (ref 6–20)
CO2: 23 mmol/L (ref 22–32)
Calcium: 9.3 mg/dL (ref 8.9–10.3)
Chloride: 104 mmol/L (ref 101–111)
Creatinine, Ser: 1.1 mg/dL — ABNORMAL HIGH (ref 0.44–1.00)
GFR calc Af Amer: >60 mL/min (ref >60)
GFR calc non Af Amer: >60 mL/min (ref >60)
Glucose, Bld: 68 mg/dL (ref 65–99)
Potassium: 4.2 mmol/L (ref 3.5–5.1)
Sodium: 137 mmol/L (ref 135–145)
TOTAL PROTEIN: 7.9 g/dL (ref 6.5–8.1)

## 2015-11-11 LAB — URINALYSIS, ROUTINE W REFLEX MICROSCOPIC
GLUCOSE, UA: NEGATIVE mg/dL
HGB URINE DIPSTICK: NEGATIVE
KETONES UR: 15 mg/dL — AB
Nitrite: NEGATIVE
Protein, ur: NEGATIVE mg/dL
Specific Gravity, Urine: 1.028 (ref 1.005–1.030)
pH: 5.5 (ref 5.0–8.0)

## 2015-11-11 LAB — LIPASE, BLOOD: LIPASE: 37 U/L (ref 11–51)

## 2015-11-11 LAB — PREGNANCY, URINE: Preg Test, Ur: NEGATIVE

## 2015-11-11 MED ORDER — SODIUM CHLORIDE 0.9 % IV BOLUS (SEPSIS)
1000.0000 mL | Freq: Once | INTRAVENOUS | Status: AC
  Administered 2015-11-11: 1000 mL via INTRAVENOUS

## 2015-11-11 MED ORDER — CEPHALEXIN 500 MG PO CAPS: 500.0000 mg | ORAL_CAPSULE | Freq: Two times a day (BID) | ORAL | Status: DC

## 2015-11-11 MED ORDER — FENTANYL CITRATE (PF) 100 MCG/2ML IJ SOLN
25.0000 ug | Freq: Once | INTRAMUSCULAR | Status: AC
  Administered 2015-11-11: 25 ug via INTRAVENOUS
  Filled 2015-11-11: qty 2

## 2015-11-11 MED ORDER — CEPHALEXIN 250 MG PO CAPS
500.0000 mg | ORAL_CAPSULE | Freq: Once | ORAL | Status: AC
  Administered 2015-11-11: 500 mg via ORAL
  Filled 2015-11-11: qty 2

## 2015-11-11 MED ORDER — ONDANSETRON HCL 4 MG/2ML IJ SOLN
4.0000 mg | Freq: Once | INTRAMUSCULAR | Status: AC
  Administered 2015-11-11: 4 mg via INTRAVENOUS
  Filled 2015-11-11: qty 2

## 2015-11-11 NOTE — ED Notes (Signed)
Generalized abdominal pain for two weeks.  Pt states she is unable keep food down has emesis.  Pain with elimination.  Last bm today.  Some chills.

## 2015-11-11 NOTE — ED Provider Notes (Signed)
CSN: [id number]     Arrival date & time 11/11/15  1414 History   First MD Initiated Contact with Patient 11/11/15 1529     Chief Complaint  Patient presents with  . Abdominal Pain     (Consider location/radiation/quality/duration/timing/severity/associated sxs/prior Treatment) HPI   Patient is a 35 year old female with history of Recurrent UTIs, bowel instructions, arthritis, chronic bronchitis, constipation, anxiety, depression, thyroid nausea presenting today with abdominal pain for last 2 weeks. Patient's been treated as an outpatient with Bentyl and Zofran. Patient reports that the pain is constant. It is located in the epigastric area. She says it feels like a tightening. Patient reports a normal bowel movement today. Patient reports mild nausea and occasional vomiting.  Patient's vital signs are normal at time of interview.  Past Medical History  Diagnosis Date  . Asthma   . Complication of anesthesia 2007    "lost me on the table when I had upper bowel obstruction"  . PONV (postoperative nausea and vomiting)   . Chronic bronchitis     "yearly"  . Hypoglycemia     "my sugar runs low alot"  . Migraines 12/28/11    "@ least once/wk"  . Recurrent UTI   . Kidney infection     "often"  . Bowel obstruction (HCC) 2007    "upper bowel"  . Arthritis     "inflammation of all my joints"  . Chronic mid back pain   . Fibromyalgia   . Anxiety   . Depression   . ADHD (attention deficit hyperactivity disorder)   . Hepatitis C   . Constipation, chronic    Past Surgical History  Procedure Laterality Date  . Bowel resection  04/2006    "upper bowel"  . Ovary surgery  ~ 2002    "had 10 inches blood built up; they opened me up and got the blood out"   Family History  Problem Relation Age of Onset  . Breast cancer Maternal Grandmother   . Colon cancer Maternal Grandmother   . Irritable bowel syndrome Maternal Grandmother   . Diabetes Mother   . Heart disease Father   . Cancer  Maternal Aunt     colon cancer   . Vision loss Maternal Grandfather    Social History  Substance Use Topics  . Smoking status: Current Every Day Smoker -- 1.00 packs/day for 16 years    Types: Cigarettes  . Smokeless tobacco: Never Used     Comment: tobacco handout given 01/31/2012  . Alcohol Use: 1.2 oz/week    2 Cans of beer per week   OB History    No data available     Review of Systems  Constitutional: Negative for activity change and fatigue.  HENT: Negative for congestion.   Eyes: Negative for discharge.  Respiratory: Negative for cough and chest tightness.   Cardiovascular: Negative for chest pain.  Gastrointestinal: Positive for nausea, vomiting and abdominal pain. Negative for diarrhea, constipation and abdominal distention.  Genitourinary: Negative for dysuria and difficulty urinating.  Musculoskeletal: Negative for joint swelling.  Skin: Negative for rash.  Allergic/Immunologic: Negative for immunocompromised state.  Psychiatric/Behavioral: Negative for behavioral problems and agitation.      Allergies  Cinnamon; Ciprofloxacin; Levaquin; Morphine and related; Penicillins; Toradol; Ultram; Acetaminophen; and Sulfa antibiotics  Home Medications   Prior to Admission medications   Medication Sig Start Date End Date Taking? Authorizing Provider  etonogestrel (IMPLANON) 68 MG IMPL implant 1 each by Subdermal route once.   Yes Historical Provider,  MD  gabapentin (NEURONTIN) 300 MG capsule Take 300 mg by mouth 3 (three) times daily.   Yes Historical Provider, MD  QUEtiapine (SEROQUEL) 50 MG tablet Take 50 mg by mouth at bedtime.   Yes Historical Provider, MD  ondansetron (ZOFRAN) 4 MG tablet Take 1 tablet (4 mg total) by mouth every 6 (six) hours. 03/07/15   Elpidio Anis, PA-C   BP 94/61 mmHg  Pulse 66  Temp(Src) 98.2 F (36.8 C) (Oral)  Resp 16  Ht 5' (1.524 m)  SpO2 95%  LMP 10/24/2015 Physical Exam  Constitutional: She is oriented to person, place, and  time. She appears well-developed and well-nourished.  HENT:  Head: Normocephalic and atraumatic.  Moist mucous membranes.  Eyes: Conjunctivae are normal. Right eye exhibits no discharge.  Neck: Neck supple.  Cardiovascular: Normal rate, regular rhythm and normal heart sounds.   No murmur heard. Pulmonary/Chest: Effort normal and breath sounds normal. She has no wheezes. She has no rales.  Abdominal: Soft. She exhibits no distension. There is tenderness.  Diffuse very mild tenderness. Worse in the epigastrium.  Musculoskeletal: Normal range of motion. She exhibits no edema.  Neurological: She is oriented to person, place, and time. No cranial nerve deficit.  Skin: Skin is warm and dry. No rash noted. She is not diaphoretic.  Psychiatric: She has a normal mood and affect. Her behavior is normal.  Nursing note and vitals reviewed.   ED Course  Procedures (including critical care time) Labs Review Labs Reviewed  URINALYSIS, ROUTINE W REFLEX MICROSCOPIC (NOT AT [facility]) - Abnormal; Notable for the following:    Color, Urine AMBER (*)    APPearance TURBID (*)    Bilirubin Urine SMALL (*)    Ketones, ur 15 (*)    Leukocytes, UA MODERATE (*)    All other components within normal limits  CBC WITH DIFFERENTIAL/PLATELET - Abnormal; Notable for the following:    WBC 11.1 (*)    RBC 5.39 (*)    Hemoglobin 15.6 (*)    HCT 46.4 (*)    Lymphs Abs 4.1 (*)    All other components within normal limits  COMPREHENSIVE METABOLIC PANEL - Abnormal; Notable for the following:    Creatinine, Ser 1.10 (*)    AST 157 (*)    ALT 136 (*)    All other components within normal limits  URINE MICROSCOPIC-ADD ON - Abnormal; Notable for the following:    Squamous Epithelial / LPF 6-30 (*)    Bacteria, UA MANY (*)    All other components within normal limits  PREGNANCY, URINE  LIPASE, BLOOD    Imaging Review Ct Abdomen Pelvis Wo Contrast  11/11/2015  CLINICAL DATA:  Upper abdominal pain for 2 weeks with  nausea vomiting and history of small bowel obstruction in 2007, diffuse abdominal pain EXAM: CT ABDOMEN AND PELVIS WITHOUT CONTRAST TECHNIQUE: Multidetector CT imaging of the abdomen and pelvis was performed following the standard protocol without IV contrast. COMPARISON:  05/29/2014 FINDINGS: Lower chest:  Visualized portions of the lung bases are clear. Hepatobiliary: Normal Pancreas: Normal Spleen: Normal Adrenals/Urinary Tract: Adrenal glands are normal. Kidneys are normal. No hydronephrosis. Bladder is distended but otherwise negative. Stomach/Bowel: Nonobstructive gas pattern with gas into the distal large bowel. Normal appendix. Normal large bowel. Stomach and small bowel are normal. Vascular/Lymphatic: No significant adenopathy. No vascular abnormalities. Reproductive: Negative Other: No ascites Musculoskeletal: No acute abnormalities IMPRESSION: No significant abnormalities. Electronically Signed   By: Esperanza Heir M.D.   On: 11/11/2015 19:06  I have personally reviewed and evaluated these images and lab results as part of my medical decision-making.   EKG Interpretation None      MDM   Final diagnoses:  None   Patient is a 35 year old very well-appearing female presenting with abdominal pain for the last 2-3 weeks. Patient reports normal bowel movement today. She reports multiple episodes of nausea. History of fibromyalgia, anxiety, depression, migraines constant chronic constipation. Patient's been seen by her PCP and given both Bentyl and Zofran.   Patient has normal vital signs and physical exam. We will get labs likely CT abdomen pelvis to rule out any intraabdominal pathology. However given that time course I'm more concerned about gastritis versus peptic ulcer disease. 8:36 PM Patient's CAT scan shows no evidence of intra-abdominal pathology. Patient has a urinary tract infection. Will treat with Keflex. Patient has allergies to almost every antibiotic. She says she's been on  Keflex in the past.   Patient able to take by mouth. Normal vital signs.         Darryel Diodato Randall An, MD 11/11/15 2036

## 2015-11-25 ENCOUNTER — Emergency Department (HOSPITAL_BASED_OUTPATIENT_CLINIC_OR_DEPARTMENT_OTHER): Payer: Medicaid Other

## 2015-11-25 ENCOUNTER — Emergency Department (HOSPITAL_BASED_OUTPATIENT_CLINIC_OR_DEPARTMENT_OTHER)
Admission: EM | Admit: 2015-11-25 | Discharge: 2015-11-25 | Disposition: A | Discharge status: Home / Self Care | Payer: Medicaid Other | Attending: Emergency Medicine | Admitting: Emergency Medicine

## 2015-11-25 ENCOUNTER — Encounter (HOSPITAL_BASED_OUTPATIENT_CLINIC_OR_DEPARTMENT_OTHER): Payer: Self-pay

## 2015-11-25 DIAGNOSIS — F17210 Nicotine dependence, cigarettes, uncomplicated: Secondary | ICD-10-CM | POA: Insufficient documentation

## 2015-11-25 DIAGNOSIS — Z8679 Personal history of other diseases of the circulatory system: Secondary | ICD-10-CM | POA: Insufficient documentation

## 2015-11-25 DIAGNOSIS — Z880 Allergy status to penicillin: Secondary | ICD-10-CM | POA: Insufficient documentation

## 2015-11-25 DIAGNOSIS — F419 Anxiety disorder, unspecified: Secondary | ICD-10-CM | POA: Insufficient documentation

## 2015-11-25 DIAGNOSIS — Z79899 Other long term (current) drug therapy: Secondary | ICD-10-CM | POA: Insufficient documentation

## 2015-11-25 DIAGNOSIS — R110 Nausea: Secondary | ICD-10-CM | POA: Insufficient documentation

## 2015-11-25 DIAGNOSIS — F329 Major depressive disorder, single episode, unspecified: Secondary | ICD-10-CM | POA: Insufficient documentation

## 2015-11-25 DIAGNOSIS — R300 Dysuria: Secondary | ICD-10-CM | POA: Insufficient documentation

## 2015-11-25 DIAGNOSIS — Z3202 Encounter for pregnancy test, result negative: Secondary | ICD-10-CM | POA: Insufficient documentation

## 2015-11-25 DIAGNOSIS — Z87440 Personal history of urinary (tract) infections: Secondary | ICD-10-CM | POA: Insufficient documentation

## 2015-11-25 DIAGNOSIS — R1031 Right lower quadrant pain: Secondary | ICD-10-CM | POA: Insufficient documentation

## 2015-11-25 DIAGNOSIS — Z87448 Personal history of other diseases of urinary system: Secondary | ICD-10-CM | POA: Insufficient documentation

## 2015-11-25 DIAGNOSIS — R509 Fever, unspecified: Secondary | ICD-10-CM | POA: Insufficient documentation

## 2015-11-25 DIAGNOSIS — M797 Fibromyalgia: Secondary | ICD-10-CM | POA: Insufficient documentation

## 2015-11-25 DIAGNOSIS — Z792 Long term (current) use of antibiotics: Secondary | ICD-10-CM | POA: Insufficient documentation

## 2015-11-25 DIAGNOSIS — R51 Headache: Secondary | ICD-10-CM | POA: Insufficient documentation

## 2015-11-25 DIAGNOSIS — R1011 Right upper quadrant pain: Secondary | ICD-10-CM | POA: Insufficient documentation

## 2015-11-25 DIAGNOSIS — J45909 Unspecified asthma, uncomplicated: Secondary | ICD-10-CM | POA: Insufficient documentation

## 2015-11-25 DIAGNOSIS — R61 Generalized hyperhidrosis: Secondary | ICD-10-CM | POA: Insufficient documentation

## 2015-11-25 DIAGNOSIS — G8929 Other chronic pain: Secondary | ICD-10-CM | POA: Insufficient documentation

## 2015-11-25 DIAGNOSIS — Z9889 Other specified postprocedural states: Secondary | ICD-10-CM | POA: Insufficient documentation

## 2015-11-25 DIAGNOSIS — R197 Diarrhea, unspecified: Secondary | ICD-10-CM | POA: Insufficient documentation

## 2015-11-25 DIAGNOSIS — Z8719 Personal history of other diseases of the digestive system: Secondary | ICD-10-CM | POA: Insufficient documentation

## 2015-11-25 LAB — URINALYSIS, ROUTINE W REFLEX MICROSCOPIC
Glucose, UA: NEGATIVE mg/dL
HGB URINE DIPSTICK: NEGATIVE
Ketones, ur: NEGATIVE mg/dL
NITRITE: NEGATIVE
Protein, ur: NEGATIVE mg/dL
SPECIFIC GRAVITY, URINE: 1.026 (ref 1.005–1.030)
pH: 6 (ref 5.0–8.0)

## 2015-11-25 LAB — HCG, SERUM, QUALITATIVE: PREG SERUM: NEGATIVE

## 2015-11-25 LAB — CBC WITH DIFFERENTIAL/PLATELET
BASOS PCT: 0 %
Basophils Absolute: 0 10*3/uL (ref 0.0–0.1)
Eosinophils Absolute: 0.1 10*3/uL (ref 0.0–0.7)
Eosinophils Relative: 1 %
HCT: 42.6 % (ref 36.0–46.0)
HEMOGLOBIN: 14.3 g/dL (ref 12.0–15.0)
LYMPHS PCT: 26 %
Lymphs Abs: 3.1 10*3/uL (ref 0.7–4.0)
MCH: 28.9 pg (ref 26.0–34.0)
MCHC: 33.6 g/dL (ref 30.0–36.0)
MCV: 86.2 fL (ref 78.0–100.0)
Monocytes Absolute: 1 10*3/uL (ref 0.1–1.0)
Monocytes Relative: 8 %
NEUTROS PCT: 65 %
Neutro Abs: 7.8 10*3/uL — ABNORMAL HIGH (ref 1.7–7.7)
Platelets: 240 10*3/uL (ref 150–400)
RBC: 4.94 MIL/uL (ref 3.87–5.11)
RDW: 13.6 % (ref 11.5–15.5)
WBC: 12 10*3/uL — ABNORMAL HIGH (ref 4.0–10.5)

## 2015-11-25 LAB — COMPREHENSIVE METABOLIC PANEL
ALT: 128 U/L — ABNORMAL HIGH (ref 14–54)
AST: 143 U/L — AB (ref 15–41)
Albumin: 4.2 g/dL (ref 3.5–5.0)
Alkaline Phosphatase: 96 U/L (ref 38–126)
Anion gap: 8 (ref 5–15)
BUN: 14 mg/dL (ref 6–20)
CHLORIDE: 105 mmol/L (ref 101–111)
CO2: 25 mmol/L (ref 22–32)
Calcium: 9.4 mg/dL (ref 8.9–10.3)
Creatinine, Ser: 0.94 mg/dL (ref 0.44–1.00)
Glucose, Bld: 99 mg/dL (ref 65–99)
POTASSIUM: 3.6 mmol/L (ref 3.5–5.1)
Sodium: 138 mmol/L (ref 135–145)
Total Bilirubin: 0.7 mg/dL (ref 0.3–1.2)
Total Protein: 7.6 g/dL (ref 6.5–8.1)

## 2015-11-25 LAB — PREGNANCY, URINE: PREG TEST UR: NEGATIVE

## 2015-11-25 LAB — URINE MICROSCOPIC-ADD ON

## 2015-11-25 LAB — LIPASE, BLOOD: LIPASE: 26 U/L (ref 11–51)

## 2015-11-25 MED ORDER — ONDANSETRON HCL 4 MG/2ML IJ SOLN
4.0000 mg | Freq: Once | INTRAMUSCULAR | Status: AC
  Administered 2015-11-25: 4 mg via INTRAVENOUS
  Filled 2015-11-25: qty 2

## 2015-11-25 MED ORDER — PROMETHAZINE HCL 25 MG PO TABS
25.0000 mg | ORAL_TABLET | Freq: Four times a day (QID) | ORAL | Status: DC | PRN
Start: 2015-11-25 — End: 2016-04-13

## 2015-11-25 MED ORDER — SODIUM CHLORIDE 0.9 % IV BOLUS (SEPSIS)
1000.0000 mL | Freq: Once | INTRAVENOUS | Status: AC
  Administered 2015-11-25: 1000 mL via INTRAVENOUS

## 2015-11-25 NOTE — ED Notes (Signed)
Attempted to start iv, unsucessful attempt, pt states that last visit pt required iv foot start, Additional RN requested to start iv

## 2015-11-25 NOTE — ED Notes (Addendum)
C/o cont'd abd pain, n/v/d since 3/21 when she was seen here for same-NAD-steady gait

## 2015-11-25 NOTE — ED Provider Notes (Signed)
CSN: [id number]     Arrival date & time 11/25/15  1812 History  By signing my name below, I, Marisue Humble, attest that this documentation has been prepared under the direction and in the presence of Lavera Guise, MD . Electronically Signed: Marisue Humble, Scribe. 11/25/2015. 7:16 PM.   Chief Complaint  Patient presents with  . Abdominal Pain   The history is provided by the patient. No language interpreter was used.   HPI Comments:  Amy Walls is a 35 y.o. female with PMHx of recurrent UTIs, bowel obstruction s/p LOA, fibromyalgia, hepatitis C and chronic constipation who presents to the Emergency Department complaining of moderate, right sided abdominal pain for the past 4 weeks, worse with eating. Pt reports associated painful urination, sharp abdominal pain waking her up at night, one "difficult, hard" bowel movement today, mild fever tmax 100, chills, diaphoresis, frequent headaches and nausea. She states she can't keep anything down except ginger ale and ice. No alleviating factors noted. Pt was seen in ED March 21 for similar symptoms and dx with UTI; she states she finished the medication for the UTI with no improvement of symptoms. She has PSHx of bowel resection and ovarian surgery. Pt denies abnormal vaginal bleeding, abnormal vaginal discharge, frequency, or h/o gallbladder problems.   Past Medical History  Diagnosis Date  . Asthma   . Complication of anesthesia 2007    "lost me on the table when I had upper bowel obstruction"  . PONV (postoperative nausea and vomiting)   . Chronic bronchitis     "yearly"  . Hypoglycemia     "my sugar runs low alot"  . Migraines 12/28/11    "@ least once/wk"  . Recurrent UTI   . Kidney infection     "often"  . Bowel obstruction (HCC) 2007    "upper bowel"  . Arthritis     "inflammation of all my joints"  . Chronic mid back pain   . Fibromyalgia   . Anxiety   . Depression   . ADHD (attention deficit hyperactivity disorder)   .  Hepatitis C   . Constipation, chronic    Past Surgical History  Procedure Laterality Date  . Bowel resection  04/2006    "upper bowel"  . Ovary surgery  ~ 2002    "had 10 inches blood built up; they opened me up and got the blood out"   Family History  Problem Relation Age of Onset  . Breast cancer Maternal Grandmother   . Colon cancer Maternal Grandmother   . Irritable bowel syndrome Maternal Grandmother   . Diabetes Mother   . Heart disease Father   . Cancer Maternal Aunt     colon cancer   . Vision loss Maternal Grandfather    Social History  Substance Use Topics  . Smoking status: Current Every Day Smoker -- 1.00 packs/day for 16 years    Types: Cigarettes  . Smokeless tobacco: Never Used     Comment: tobacco handout given 01/31/2012  . Alcohol Use: Yes     Comment: occ   OB History    No data available     Review of Systems  Constitutional: Positive for fever, chills and diaphoresis.  Gastrointestinal: Positive for nausea, abdominal pain and constipation.  Genitourinary: Positive for dysuria. Negative for frequency, vaginal bleeding and vaginal discharge.  Neurological: Positive for headaches.  All other systems reviewed and are negative.  Allergies  Cinnamon; Ciprofloxacin; Levaquin; Morphine and related; Penicillins; Toradol; Ultram; Acetaminophen; and  Sulfa antibiotics  Home Medications   Prior to Admission medications   Medication Sig Start Date End Date Taking? Authorizing Provider  cephALEXin (KEFLEX) 500 MG capsule Take 1 capsule (500 mg total) by mouth 2 (two) times daily. 11/11/15   Courteney Lyn Mackuen, MD  etonogestrel (IMPLANON) 68 MG IMPL implant 1 each by Subdermal route once.    Historical Provider, MD  gabapentin (NEURONTIN) 300 MG capsule Take 300 mg by mouth 3 (three) times daily.    Historical Provider, MD  ondansetron (ZOFRAN) 4 MG tablet Take 1 tablet (4 mg total) by mouth every 6 (six) hours. 03/07/15   Elpidio Anis, PA-C  promethazine  (PHENERGAN) 25 MG tablet Take 1 tablet (25 mg total) by mouth every 6 (six) hours as needed for nausea or vomiting. 11/25/15   Lavera Guise, MD  QUEtiapine (SEROQUEL) 50 MG tablet Take 50 mg by mouth at bedtime.    Historical Provider, MD   BP 107/71 mmHg  Pulse 69  Temp(Src) 98.7 F (37.1 C) (Oral)  Resp 18  Ht  (1.499 m)  Wt 155 lb (70.308 kg)  BMI 31.29 kg/m2  SpO2 97%  LMP 10/24/2015 Physical Exam Physical Exam  Nursing note and vitals reviewed. Constitutional: Well developed, well nourished, non-toxic, and in no acute distress Head: Normocephalic and atraumatic.  Mouth/Throat: Oropharynx is clear. Dry mucous membranes. Neck: Normal range of motion. Neck supple.  Cardiovascular: Normal rate and regular rhythm.   Pulmonary/Chest: Effort normal and breath sounds normal.  Abdominal: Soft. Right sided abdominal tenderness. Negative murphy's sign. There is no rebound and some voluntary guarding with palpitation over right hemiabdomen, however when pt self palpates her abdomen, there is no painful reaction.  Musculoskeletal: Normal range of motion.  Neurological: Alert, no facial droop, fluent speech, moves all extremities symmetrically Skin: Skin is warm and dry.  Psychiatric: Cooperative  ED Course  Procedures  DIAGNOSTIC STUDIES:  Oxygen Saturation is 99% on RA, normal by my interpretation.    COORDINATION OF CARE:  7:12 PM Will administer fluids and nausea medication. Informed pt no new imaging was necessitated at this time. Will order blood work and UA. Discussed treatment plan with pt at bedside and pt agreed to plan.  Labs Review Labs Reviewed  URINALYSIS, ROUTINE W REFLEX MICROSCOPIC (NOT AT [facility]) - Abnormal; Notable for the following:    Color, Urine AMBER (*)    APPearance CLOUDY (*)    Bilirubin Urine SMALL (*)    Leukocytes, UA MODERATE (*)    All other components within normal limits  COMPREHENSIVE METABOLIC PANEL - Abnormal; Notable for the following:     AST 143 (*)    ALT 128 (*)    All other components within normal limits  CBC WITH DIFFERENTIAL/PLATELET - Abnormal; Notable for the following:    WBC 12.0 (*)    Neutro Abs 7.8 (*)    All other components within normal limits  URINE MICROSCOPIC-ADD ON - Abnormal; Notable for the following:    Squamous Epithelial / LPF 0-5 (*)    Bacteria, UA MANY (*)    Casts GRANULAR CAST (*)    All other components within normal limits  PREGNANCY, URINE  LIPASE, BLOOD  HCG, SERUM, QUALITATIVE  CBC WITH DIFFERENTIAL/PLATELET    Imaging Review Dg Abd Acute W/chest  11/25/2015  CLINICAL DATA:  Nausea, vomiting and constipation. Lower abdominal pain. EXAM: DG ABDOMEN ACUTE W/ 1V CHEST COMPARISON:  CT of the abdomen and pelvis on 11/11/2015 FINDINGS: There is  no evidence of pulmonary edema, consolidation, pneumothorax, nodule or pleural fluid. Lungs show bibasilar atelectasis. The heart size is normal. Abdominal films show no evidence of bowel obstruction. There are some scattered air-fluid levels which are felt to primarily be within colon. This may imply some component enteritis. No free air identified. No abnormal calcifications. Bony structures are unremarkable. IMPRESSION: No significant acute findings. Scattered air-fluid levels in the colon may be consistent with enteritis. Electronically Signed   By: Irish LackGlenn  Yamagata M.D.   On: 11/25/2015 22:35   [place] Abdomen Limited Ruq  11/25/2015  CLINICAL DATA:  Right upper quadrant abdominal pain, nausea and vomiting. EXAM: US ABDOMEN LIMITED - RIGHT UPPER QUADRANT COMPARISON:  Unenhanced CT of the abdomen on 11/11/2015 FINDINGS: Gallbladder: Although the gallbladder is relatively contracted, there is evidence of gallbladder wall thickening and edema with wall thickness approaching 4 mm. No definite calculi are identified. This would implicate potential underlying cholecystitis, which may be acute or chronic. Common bile duct: Diameter: Normal caliber of 3 mm. Liver: No  focal lesion identified. Within normal limits in parenchymal echogenicity. IMPRESSION: Relatively contracted gallbladder with diffuse wall thickening. This would implicate potential underlying cholecystitis which may be acute or chronic. No shadowing calculi are seen within the gallbladder. Electronically Signed   By: Irish LackGlenn  Yamagata M.D.   On: 11/25/2015 23:02   I have personally reviewed and evaluated these images and lab results as part of my medical decision-making.   EKG Interpretation None      MDM   Final diagnoses:  RUQ pain  Right lower quadrant abdominal pain   35 year old female who presents with abdominal pain, vomiting, and diarrhea. On chart review was seen in the ED March 21 with the same pain, and at that time it has been ongoing for 2 weeks. Had CT scan of her abdomen and pelvis showing no acute intra-abdominal processes. Pain has been constant and unchanged since then, now making it 4 weeks of symptoms. She has vital signs within normal limits. On my exam, she has a soft and nonsurgical abdomen. There is right hemi-abdominal tenderness no pelvic tenderness. However, when she palpates her abdomen herself she does not have any reaction although when I palpate she has voluntary guarding. Patient also unclear about where her pain is initially saying that it is only in her right upper quadrant, and then subsequently stating that her pain is only in the right lower part of her abdomen and having no tenderness when I palpate her RUQ. Given that pain is now ongoing for 4 weeks and in the interim has had a negative CT abdomen pelvis, I do not feel that she requires repeat CT and does not suspect serious intra-abdominal process at this time. She has had a history of severe chronic constipation, which presented very similar in the past. Her blood work shows a transaminitis without evidence of hyperbilirubinemia or biliary obstruction. History of HCV and transaminitis, unchanged from prior and  likely etiology HCV. Blood work otherwise unremarkable. XR abdomen without obstruction and clinically this seems less likely. Tolerating PO after iv fluids and antiemetics. RUQ US was initially performed, showing contracted gallbladder with some thickening ? Chronic cholecystitis. On my evaluation, no RUQ pain and seems unlikely acute cholecystitis, especially ongoing pain x 4 weeks. Given surgery and GI follow-up. Felt appropriate for discharge with continued outpatient follow-up. Strict return and follow-up instructions reviewed. She expressed understanding of all discharge instructions and felt comfortable with the plan of care.     I  personally performed the services described in this documentation, which was scribed in my presence. The recorded information has been reviewed and is accurate.    Lavera Guise, MD 11/26/15 [phone]

## 2015-11-25 NOTE — Discharge Instructions (Signed)
You recently had a CT scan showing no serious cause of your abdominal pain while you have been having symptoms. Your ultrasound may suggest chronic infection with your gallbladder but that is not where you are having your symptoms today. We have given you surgery follow-up as needed for this. You are also given referral to GI for work-up of your ongoing abdominal pain. We encourage you to continue to take your laxatives. Call your PCP tomorrow for reevaluation in the next 2-3 days. Return for worsening symptoms, including fever, unable to keep down any fluids/food or any other symptoms concerning to you.  Abdominal Pain, Adult Many things can cause belly (abdominal) pain. Most times, the belly pain is not dangerous. Many cases of belly pain can be watched and treated at home. HOME CARE   Do not take medicines that help you go poop (laxatives) unless told to by your doctor.  Only take medicine as told by your doctor.  Eat or drink as told by your doctor. Your doctor will tell you if you should be on a special diet. GET HELP IF:  You do not know what is causing your belly pain.  You have belly pain while you are sick to your stomach (nauseous) or have runny poop (diarrhea).  You have pain while you pee or poop.  Your belly pain wakes you up at night.  You have belly pain that gets worse or better when you eat.  You have belly pain that gets worse when you eat fatty foods.  You have a fever. GET HELP RIGHT AWAY IF:   The pain does not go away within 2 hours.  You keep throwing up (vomiting).  The pain changes and is only in the right or left part of the belly.  You have bloody or tarry looking poop. MAKE SURE YOU:   Understand these instructions.  Will watch your condition.  Will get help right away if you are not doing well or get worse.   This information is not intended to replace advice given to you by your health care provider. Make sure you discuss any questions you have  with your health care provider.   Document Released: 01/26/2008 Document Revised: 08/30/2014 Document Reviewed: 04/18/2013 Elsevier Interactive Patient Education [employer]2016 [employer]

## 2015-11-25 NOTE — ED Notes (Signed)
Unable to provide urine sample

## 2016-04-13 ENCOUNTER — Encounter (HOSPITAL_BASED_OUTPATIENT_CLINIC_OR_DEPARTMENT_OTHER): Payer: Self-pay | Admitting: *Deleted

## 2016-04-13 ENCOUNTER — Emergency Department (HOSPITAL_BASED_OUTPATIENT_CLINIC_OR_DEPARTMENT_OTHER)
Admission: EM | Admit: 2016-04-13 | Discharge: 2016-04-13 | Disposition: A | Discharge status: Home / Self Care | Payer: Medicaid Other | Attending: Emergency Medicine | Admitting: Emergency Medicine

## 2016-04-13 ENCOUNTER — Emergency Department (HOSPITAL_BASED_OUTPATIENT_CLINIC_OR_DEPARTMENT_OTHER): Payer: Medicaid Other

## 2016-04-13 DIAGNOSIS — Y939 Activity, unspecified: Secondary | ICD-10-CM | POA: Insufficient documentation

## 2016-04-13 DIAGNOSIS — F909 Attention-deficit hyperactivity disorder, unspecified type: Secondary | ICD-10-CM | POA: Insufficient documentation

## 2016-04-13 DIAGNOSIS — J45909 Unspecified asthma, uncomplicated: Secondary | ICD-10-CM | POA: Insufficient documentation

## 2016-04-13 DIAGNOSIS — S63502A Unspecified sprain of left wrist, initial encounter: Secondary | ICD-10-CM | POA: Insufficient documentation

## 2016-04-13 DIAGNOSIS — Y929 Unspecified place or not applicable: Secondary | ICD-10-CM | POA: Insufficient documentation

## 2016-04-13 DIAGNOSIS — Y999 Unspecified external cause status: Secondary | ICD-10-CM | POA: Insufficient documentation

## 2016-04-13 DIAGNOSIS — X58XXXA Exposure to other specified factors, initial encounter: Secondary | ICD-10-CM | POA: Insufficient documentation

## 2016-04-13 DIAGNOSIS — F17210 Nicotine dependence, cigarettes, uncomplicated: Secondary | ICD-10-CM | POA: Insufficient documentation

## 2016-04-13 MED ORDER — IBUPROFEN 400 MG PO TABS: 400.0000 mg | ORAL_TABLET | Freq: Four times a day (QID) | ORAL | 0 refills | Status: DC | PRN

## 2016-04-13 NOTE — ED Provider Notes (Signed)
[facility] Provider Note   CSN: [id number] Arrival date & time: 04/13/16  [id number]     History   Chief Complaint Chief Complaint  Patient presents with  . Hand Pain    HPI Amy Walls is a 35 y.o. female.  HPI   35 year old right-hand dominant female with past medical history of chronic opioid abuse, hepatitis C, currently on methadone who presents with a several-day history of left wrist pain. The patient states that her symptoms began as tenderness and an aching, throbbing pain over her distal wrist and proximal hand. She denies any direct trauma, but does frequent lifting and states she may have sprained it. She bought a brace at [facility] and this has not improved her pain. She has not taken anything other than her methadone. Pain is made worse with palpation and movement. Denies any alleviating factors. Denies any distal numbness or weakness. She readily endorses a previous history of IV drug abuse but denies any current injection use. Denies any rash or injection. Previously at the site of tenderness.  Past Medical History:  Diagnosis Date  . ADHD (attention deficit hyperactivity disorder)   . Anxiety   . Arthritis    "inflammation of all my joints"  . Asthma   . Bowel obstruction (HCC) 2007   "upper bowel"  . Chronic bronchitis    "yearly"  . Chronic mid back pain   . Complication of anesthesia 2007   "lost me on the table when I had upper bowel obstruction"  . Constipation, chronic   . Depression   . Fibromyalgia   . Hepatitis C   . Hypoglycemia    "my sugar runs low alot"  . Kidney infection    "often"  . Migraines 12/28/11   "@ least once/wk"  . PONV (postoperative nausea and vomiting)   . Recurrent UTI     Patient Active Problem List   Diagnosis Date Noted  . Nausea vomiting and diarrhea 08/29/2014  . Intractable vomiting 08/29/2014  . Diarrhea 08/29/2014  . Intussusception (HCC) 01/01/2012  . Acute back pain 12/28/2011  . Abdominal pain 12/28/2011   . Transaminitis 12/28/2011  . Asthma 12/28/2011  . IBS (irritable bowel syndrome) 12/28/2011  . Tobacco abuse 12/28/2011    Past Surgical History:  Procedure Laterality Date  . BOWEL RESECTION  04/2006   "upper bowel"  . OVARY SURGERY  ~ 2002   "had 10 inches blood built up; they opened me up and got the blood out"    OB History    No data available       Home Medications    Prior to Admission medications   Medication Sig Start Date End Date Taking? Authorizing Provider  etonogestrel (IMPLANON) 68 MG IMPL implant 1 each by Subdermal route once.    Historical Provider, MD  gabapentin (NEURONTIN) 300 MG capsule Take 300 mg by mouth 3 (three) times daily.    Historical Provider, MD  ibuprofen (ADVIL,MOTRIN) 400 MG tablet Take 1 tablet (400 mg total) by mouth every 6 (six) hours as needed for moderate pain. 04/13/16   Shaune Pollackameron Deasiah Hagberg, MD  QUEtiapine (SEROQUEL) 50 MG tablet Take 50 mg by mouth at bedtime.    Historical Provider, MD    Family History Family History  Problem Relation Age of Onset  . Breast cancer Maternal Grandmother   . Colon cancer Maternal Grandmother   . Irritable bowel syndrome Maternal Grandmother   . Diabetes Mother   . Heart disease Father   .  Cancer Maternal Aunt     colon cancer   . Vision loss Maternal Grandfather     Social History Social History  Substance Use Topics  . Smoking status: Current Every Day Smoker    Packs/day: 1.00    Years: 16.00    Types: Cigarettes  . Smokeless tobacco: Never Used     Comment: tobacco handout given 01/31/2012  . Alcohol use Yes     Comment: occ     Allergies   Cinnamon; Ciprofloxacin; Levaquin [levofloxacin]; Morphine and related; Penicillins; Toradol [ketorolac tromethamine]; Ultram [tramadol]; Acetaminophen; and Sulfa antibiotics   Review of Systems Review of Systems  Constitutional: Negative for chills and fever.  Respiratory: Negative for shortness of breath.   Cardiovascular: Negative for  chest pain.  Musculoskeletal: Positive for arthralgias. Negative for neck pain.  Skin: Negative for rash and wound.  Allergic/Immunologic: Negative for immunocompromised state.  Neurological: Negative for weakness and numbness.  Hematological: Does not bruise/bleed easily.     Physical Exam Updated Vital Signs BP 108/79 (BP Location: Right Arm)   Pulse 78   Temp 98.2 F (36.8 C) (Oral)   Resp 18   Ht 4\' 11"  (1.499 m)   Wt 158 lb 14.4 oz (72.1 kg)   LMP 01/03/2016   SpO2 99%   BMI 32.09 kg/m   Physical Exam  Constitutional: She is oriented to person, place, and time. She appears well-developed and well-nourished. No distress.  HENT:  Head: Normocephalic and atraumatic.  Eyes: Conjunctivae are normal.  Neck: Neck supple.  Cardiovascular: Normal rate, regular rhythm and normal heart sounds.   Pulmonary/Chest: Effort normal. No respiratory distress. She has no wheezes.  Abdominal: She exhibits no distension.  Musculoskeletal: She exhibits no edema.  Neurological: She is alert and oriented to person, place, and time. She exhibits normal muscle tone.  Skin: Skin is warm. Capillary refill takes less than 2 seconds. No rash noted.  Nursing note and vitals reviewed.   UPPER EXTREMITY EXAM: LEFT  INSPECTION & PALPATION: Mild diffuse tenderness to palpation of her distal radial and ulnar aspect of wrist. There is no snuffbox tenderness. Range of motion at wrist is mildly painful but full. No appreciable joint effusion.  SENSORY: Sensation is intact to light touch in:  Superficial radial nerve distribution (dorsal first web space) Median nerve distribution (tip of index finger)   Ulnar nerve distribution (tip of small finger)     MOTOR:  + Motor posterior interosseous nerve (thumb IP extension) + Anterior interosseous nerve (thumb IP flexion, index finger DIP flexion) + Radial nerve (wrist extension) + Median nerve (palpable firing thenar mass) + Ulnar nerve (palpable firing  of first dorsal interosseous muscle)  VASCULAR: 2+ radial pulse Brisk capillary refill < 2 sec, fingers warm and well-perfused   ED Treatments / Results  Labs (all labs ordered are listed, but only abnormal results are displayed) Labs Reviewed - No data to display  EKG  EKG Interpretation None       Radiology Dg Wrist Complete Left  Result Date: 04/13/2016 CLINICAL DATA:  Left wrist and hand pain for 1 week EXAM: LEFT WRIST - COMPLETE 3+ VIEW COMPARISON:  None. FINDINGS: There is no evidence of fracture or dislocation. There is no evidence of arthropathy or other focal bone abnormality. Soft tissues are unremarkable. IMPRESSION: Negative. Electronically Signed   By: Elige KoHetal  Patel   On: 04/13/2016 10:31   Dg Hand Complete Left  Result Date: 04/13/2016 CLINICAL DATA:  Left wrist and hand pain  for 1 week EXAM: LEFT HAND - COMPLETE 3+ VIEW COMPARISON:  None. FINDINGS: There is no evidence of fracture or dislocation. There is no evidence of arthropathy or other focal bone abnormality. Soft tissues are unremarkable. IMPRESSION: Negative. Electronically Signed   By: Elige Ko   On: 04/13/2016 10:32    Procedures Procedures (including critical care time)  Medications Ordered in ED Medications - No data to display   Initial Impression / Assessment and Plan / ED Course  I have reviewed the triage vital signs and the nursing notes.  Pertinent labs & imaging results that were available during my care of the patient were reviewed by me and considered in my medical decision making (see chart for details).  Clinical Course  35 year old female with past medical history as above who presents with mild tenderness of her left wrist. Examination is overall unremarkable, with mild diffuse bony tenderness but no swelling or deformity. She is neurovascularly intact distal to the area of tenderness. Plain films obtained and showed no fracture. I suspect the patient has a mild wrist sprain or  strain. There is no evidence of skin changes to suggest cellulitis or abscess. She has normal range of motion, no wrist effusion, no signs of septic arthritis. No track marks or signs of skin involvement. Will give wrist splint, NSAIDs, and outpatient follow-up.   Final Clinical Impressions(s) / ED Diagnoses   Final diagnoses:  Wrist sprain, left, initial encounter    New Prescriptions Discharge Medication List as of 04/13/2016 10:58 AM    START taking these medications   Details  ibuprofen (ADVIL,MOTRIN) 400 MG tablet Take 1 tablet (400 mg total) by mouth every 6 (six) hours as needed for moderate pain., Starting Tue 04/13/2016, Print         Shaune Pollack, MD 04/13/16 [id number]

## 2016-04-13 NOTE — ED Triage Notes (Signed)
Pt reports one week of left hand pain, states "it's in my thumb, like a cracking popping in my thumb joint". Denies injury or trauma

## 2016-08-24 ENCOUNTER — Emergency Department (HOSPITAL_BASED_OUTPATIENT_CLINIC_OR_DEPARTMENT_OTHER): Payer: Medicaid Other

## 2016-08-24 ENCOUNTER — Emergency Department (HOSPITAL_BASED_OUTPATIENT_CLINIC_OR_DEPARTMENT_OTHER)
Admission: EM | Admit: 2016-08-24 | Discharge: 2016-08-24 | Disposition: A | Discharge status: Home / Self Care | Payer: Medicaid Other | Attending: Emergency Medicine | Admitting: Emergency Medicine

## 2016-08-24 ENCOUNTER — Encounter (HOSPITAL_BASED_OUTPATIENT_CLINIC_OR_DEPARTMENT_OTHER): Payer: Self-pay | Admitting: *Deleted

## 2016-08-24 DIAGNOSIS — J45909 Unspecified asthma, uncomplicated: Secondary | ICD-10-CM | POA: Insufficient documentation

## 2016-08-24 DIAGNOSIS — F17210 Nicotine dependence, cigarettes, uncomplicated: Secondary | ICD-10-CM | POA: Insufficient documentation

## 2016-08-24 DIAGNOSIS — K529 Noninfective gastroenteritis and colitis, unspecified: Secondary | ICD-10-CM

## 2016-08-24 DIAGNOSIS — N390 Urinary tract infection, site not specified: Secondary | ICD-10-CM | POA: Insufficient documentation

## 2016-08-24 DIAGNOSIS — Z79899 Other long term (current) drug therapy: Secondary | ICD-10-CM | POA: Insufficient documentation

## 2016-08-24 DIAGNOSIS — F909 Attention-deficit hyperactivity disorder, unspecified type: Secondary | ICD-10-CM | POA: Insufficient documentation

## 2016-08-24 LAB — CBC WITH DIFFERENTIAL/PLATELET
BASOS ABS: 0 10*3/uL (ref 0.0–0.1)
Basophils Relative: 0 %
EOS ABS: 0.1 10*3/uL (ref 0.0–0.7)
EOS PCT: 1 %
HCT: 41.6 % (ref 36.0–46.0)
Hemoglobin: 14.1 g/dL (ref 12.0–15.0)
Lymphocytes Relative: 32 %
Lymphs Abs: 3.1 10*3/uL (ref 0.7–4.0)
MCH: 29.3 pg (ref 26.0–34.0)
MCHC: 33.9 g/dL (ref 30.0–36.0)
MCV: 86.5 fL (ref 78.0–100.0)
MONO ABS: 0.9 10*3/uL (ref 0.1–1.0)
Monocytes Relative: 9 %
Neutro Abs: 5.8 10*3/uL (ref 1.7–7.7)
Neutrophils Relative %: 58 %
PLATELETS: 314 10*3/uL (ref 150–400)
RBC: 4.81 MIL/uL (ref 3.87–5.11)
RDW: 12.1 % (ref 11.5–15.5)
WBC: 9.9 10*3/uL (ref 4.0–10.5)

## 2016-08-24 LAB — LIPASE, BLOOD: Lipase: 26 U/L (ref 11–51)

## 2016-08-24 LAB — COMPREHENSIVE METABOLIC PANEL
ALK PHOS: 87 U/L (ref 38–126)
ALT: 114 U/L — AB (ref 14–54)
AST: 151 U/L — ABNORMAL HIGH (ref 15–41)
Albumin: 4.3 g/dL (ref 3.5–5.0)
Anion gap: 6 (ref 5–15)
BUN: 12 mg/dL (ref 6–20)
CO2: 25 mmol/L (ref 22–32)
CREATININE: 0.68 mg/dL (ref 0.44–1.00)
Calcium: 9.3 mg/dL (ref 8.9–10.3)
Chloride: 103 mmol/L (ref 101–111)
Glucose, Bld: 107 mg/dL — ABNORMAL HIGH (ref 65–99)
Potassium: 4 mmol/L (ref 3.5–5.1)
Sodium: 134 mmol/L — ABNORMAL LOW (ref 135–145)
Total Bilirubin: 0.4 mg/dL (ref 0.3–1.2)
Total Protein: 7.8 g/dL (ref 6.5–8.1)

## 2016-08-24 LAB — URINALYSIS, ROUTINE W REFLEX MICROSCOPIC
Bilirubin Urine: NEGATIVE
Glucose, UA: NEGATIVE mg/dL
Hgb urine dipstick: NEGATIVE
Ketones, ur: NEGATIVE mg/dL
Leukocytes, UA: NEGATIVE
Nitrite: POSITIVE — AB
Protein, ur: NEGATIVE mg/dL
Specific Gravity, Urine: 1.023 (ref 1.005–1.030)
pH: 7 (ref 5.0–8.0)

## 2016-08-24 LAB — URINALYSIS, MICROSCOPIC (REFLEX)

## 2016-08-24 LAB — PREGNANCY, URINE: PREG TEST UR: NEGATIVE

## 2016-08-24 MED ORDER — METOCLOPRAMIDE HCL 5 MG/ML IJ SOLN
10.0000 mg | Freq: Once | INTRAMUSCULAR | Status: AC
  Administered 2016-08-24: 10 mg via INTRAVENOUS
  Filled 2016-08-24: qty 2

## 2016-08-24 MED ORDER — ONDANSETRON HCL 4 MG/2ML IJ SOLN
4.0000 mg | Freq: Once | INTRAMUSCULAR | Status: AC
  Administered 2016-08-24: 4 mg via INTRAVENOUS
  Filled 2016-08-24: qty 2

## 2016-08-24 MED ORDER — NITROFURANTOIN MONOHYD MACRO 100 MG PO CAPS: 100.0000 mg | ORAL_CAPSULE | Freq: Two times a day (BID) | ORAL | 0 refills | Status: DC

## 2016-08-24 MED ORDER — SODIUM CHLORIDE 0.9 % IV BOLUS (SEPSIS)
1000.0000 mL | Freq: Once | INTRAVENOUS | Status: AC
  Administered 2016-08-24: 1000 mL via INTRAVENOUS

## 2016-08-24 MED ORDER — FENTANYL CITRATE (PF) 100 MCG/2ML IJ SOLN
50.0000 ug | Freq: Once | INTRAMUSCULAR | Status: DC
  Filled 2016-08-24: qty 2

## 2016-08-24 MED ORDER — METOCLOPRAMIDE HCL 10 MG PO TABS: 10.0000 mg | ORAL_TABLET | Freq: Four times a day (QID) | ORAL | 0 refills | Status: DC | PRN

## 2016-08-24 NOTE — ED Notes (Signed)
Pt states she is getting tx for heroin addiction and is being seen in a methadone clinic. Dr Regino SchultzeBelfy made aware.

## 2016-08-24 NOTE — ED Provider Notes (Signed)
[facility]   CSN: [id number] Arrival date & time: 08/24/16  1057     History   Chief Complaint Chief Complaint  Patient presents with  . Abdominal Pain    HPI Amy Walls is a 36 y.o. female.  Patient is a 36 year old female who presents with abdominal pain. She has a history of IBS. She also states she had a prior bowel obstruction that required surgery in 2007. She's not sure the etiology for this. She presents with a three-day history of nausea vomiting and diarrhea. She states her diarrhea has subsided but she still has ongoing vomiting. She has pain across her upper abdomen. She feels like her abdomen is a little bit distended. She states her boyfriend had similar symptoms of vomiting and diarrhea which have subsequently resolved. She denies any known fevers although she feels hot. No urinary symptoms. Her emesis is nonbloody and nonbilious. She had associated watery diarrhea.      Past Medical History:  Diagnosis Date  . ADHD (attention deficit hyperactivity disorder)   . Anxiety   . Arthritis    "inflammation of all my joints"  . Asthma   . Bowel obstruction 2007   "upper bowel"  . Chronic bronchitis    "yearly"  . Chronic mid back pain   . Complication of anesthesia 2007   "lost me on the table when I had upper bowel obstruction"  . Constipation, chronic   . Depression   . Fibromyalgia   . Hepatitis C   . Hypoglycemia    "my sugar runs low alot"  . Kidney infection    "often"  . Migraines 12/28/11   "@ least once/wk"  . PONV (postoperative nausea and vomiting)   . Recurrent UTI     Patient Active Problem List   Diagnosis Date Noted  . Nausea vomiting and diarrhea 08/29/2014  . Intractable vomiting 08/29/2014  . Diarrhea 08/29/2014  . Intussusception (HCC) 01/01/2012  . Acute back pain 12/28/2011  . Abdominal pain 12/28/2011  . Transaminitis 12/28/2011  . Asthma 12/28/2011  . IBS (irritable bowel syndrome) 12/28/2011  .  Tobacco abuse 12/28/2011    Past Surgical History:  Procedure Laterality Date  . BOWEL RESECTION  04/2006   "upper bowel"  . OVARY SURGERY  ~ 2002   "had 10 inches blood built up; they opened me up and got the blood out"    OB History    No data available       Home Medications    Prior to Admission medications   Medication Sig Start Date End Date Taking? Authorizing Provider  etonogestrel (IMPLANON) 68 MG IMPL implant 1 each by Subdermal route once.    Historical Provider, MD  gabapentin (NEURONTIN) 300 MG capsule Take 300 mg by mouth 3 (three) times daily.    Historical Provider, MD  ibuprofen (ADVIL,MOTRIN) 400 MG tablet Take 1 tablet (400 mg total) by mouth every 6 (six) hours as needed for moderate pain. 04/13/16   Shaune Pollackameron Isaacs, MD  metoCLOPramide (REGLAN) 10 MG tablet Take 1 tablet (10 mg total) by mouth every 6 (six) hours as needed for nausea (nausea/headache). 08/24/16   Rolan BuccoMelanie Bridget Westbrooks, MD  nitrofurantoin, macrocrystal-monohydrate, (MACROBID) 100 MG capsule Take 1 capsule (100 mg total) by mouth 2 (two) times daily. X 7 days 08/24/16   Rolan BuccoMelanie Jerah Esty, MD  QUEtiapine (SEROQUEL) 50 MG tablet Take 50 mg by mouth at bedtime.    Historical Provider, MD    Family History Family History  Problem Relation Age of Onset  . Breast cancer Maternal Grandmother   . Colon cancer Maternal Grandmother   . Irritable bowel syndrome Maternal Grandmother   . Diabetes Mother   . Heart disease Father   . Cancer Maternal Aunt     colon cancer   . Vision loss Maternal Grandfather     Social History Social History  Substance Use Topics  . Smoking status: Current Every Day Smoker    Packs/day: 1.00    Years: 16.00    Types: Cigarettes  . Smokeless tobacco: Never Used     Comment: tobacco handout given 01/31/2012  . Alcohol use Yes     Comment: occ     Allergies   Cinnamon; Ciprofloxacin; Levaquin [levofloxacin]; Morphine and related; Penicillins; Toradol [ketorolac tromethamine];  Ultram [tramadol]; Acetaminophen; and Sulfa antibiotics   Review of Systems Review of Systems  Constitutional: Positive for fatigue. Negative for chills, diaphoresis and fever.  HENT: Negative for congestion, rhinorrhea and sneezing.   Eyes: Negative.   Respiratory: Negative for cough, chest tightness and shortness of breath.   Cardiovascular: Negative for chest pain and leg swelling.  Gastrointestinal: Positive for abdominal pain, diarrhea, nausea and vomiting. Negative for blood in stool.  Genitourinary: Negative for difficulty urinating, flank pain, frequency and hematuria.  Musculoskeletal: Negative for arthralgias and back pain.  Skin: Negative for rash.  Neurological: Negative for dizziness, speech difficulty, weakness, numbness and headaches.     Physical Exam Updated Vital Signs BP 103/67 (BP Location: Right Arm)   Pulse 63   Resp 18   Ht 5' (1.524 m)   Wt 158 lb (71.7 kg)   SpO2 96%   BMI 30.86 kg/m   Physical Exam  Constitutional: She is oriented to person, place, and time. She appears well-developed and well-nourished.  HENT:  Head: Normocephalic and atraumatic.  Eyes: Pupils are equal, round, and reactive to light.  Neck: Normal range of motion. Neck supple.  Cardiovascular: Normal rate, regular rhythm and normal heart sounds.   Pulmonary/Chest: Effort normal and breath sounds normal. No respiratory distress. She has no wheezes. She has no rales. She exhibits no tenderness.  Abdominal: Soft. Bowel sounds are normal. There is tenderness (Moderate tenderness across upper abdomen). There is no rebound and no guarding.  Musculoskeletal: Normal range of motion. She exhibits no edema.  Lymphadenopathy:    She has no cervical adenopathy.  Neurological: She is alert and oriented to person, place, and time.  Skin: Skin is warm and dry. No rash noted.  Psychiatric: She has a normal mood and affect.     ED Treatments / Results  Labs (all labs ordered are listed, but  only abnormal results are displayed) Labs Reviewed  COMPREHENSIVE METABOLIC PANEL - Abnormal; Notable for the following:       Result Value   Sodium 134 (*)    Glucose, Bld 107 (*)    AST 151 (*)    ALT 114 (*)    All other components within normal limits  URINALYSIS, ROUTINE W REFLEX MICROSCOPIC - Abnormal; Notable for the following:    APPearance CLOUDY (*)    Nitrite POSITIVE (*)    All other components within normal limits  URINALYSIS, MICROSCOPIC (REFLEX) - Abnormal; Notable for the following:    Bacteria, UA MANY (*)    Squamous Epithelial / LPF 6-30 (*)    All other components within normal limits  URINE CULTURE  LIPASE, BLOOD  CBC WITH DIFFERENTIAL/PLATELET  PREGNANCY, URINE  EKG  EKG Interpretation None       Radiology Dg Abd Acute W/chest  Result Date: 08/24/2016 CLINICAL DATA:  Epigastric abdominal pain. Left lower quadrant pain. Personal history of bowel obstruction. EXAM: DG ABDOMEN ACUTE W/ 1V CHEST COMPARISON:  None. FINDINGS: There is no evidence of dilated bowel loops or free intraperitoneal air. No radiopaque calculi or other significant radiographic abnormality is seen. Heart size and mediastinal contours are within normal limits. Both lungs are clear. IMPRESSION: Negative abdominal radiographs.  No acute cardiopulmonary disease. Electronically Signed   By: Marin Roberts M.D.   On: 08/24/2016 12:54    Procedures Procedures (including critical care time)  Medications Ordered in ED Medications  sodium chloride 0.9 % bolus 1,000 mL (0 mLs Intravenous Stopped 08/24/16 1320)  ondansetron (ZOFRAN) injection 4 mg (4 mg Intravenous Given 08/24/16 1222)  metoCLOPramide (REGLAN) injection 10 mg (10 mg Intravenous Given 08/24/16 1455)     Initial Impression / Assessment and Plan / ED Course  I have reviewed the triage vital signs and the nursing notes.  Pertinent labs & imaging results that were available during my care of the patient were reviewed by me  and considered in my medical decision making (see chart for details).  Clinical Course     Patient's x-ray shows no evidence of obstruction. She's feeling better after IV fluids and antiemetics. She is able tolerate by mouth fluids. She's had no ongoing vomiting in the ED. Her urine does show signs of infection and she now says that she has had some burning on urination and urinary frequency. I will treat her with Macrobid. She has multiple drug allergies. I feel like her other symptoms are consistent with a viral gastroenteritis. Her labs are non-concerning. She has elevation in her liver enzymes but this appears to be chronic in nature. She has no ongoing abdominal pain. Of note she was not given narcotics given her prior history of heroin abuse. She was discharged home in good condition. She was encouraged to follow-up with her primary care physician if her symptoms are not improving. Return precautions were given.  Final Clinical Impressions(s) / ED Diagnoses   Final diagnoses:  Gastroenteritis  Urinary tract infection without hematuria, site unspecified    New Prescriptions New Prescriptions   METOCLOPRAMIDE (REGLAN) 10 MG TABLET    Take 1 tablet (10 mg total) by mouth every 6 (six) hours as needed for nausea (nausea/headache).   NITROFURANTOIN, MACROCRYSTAL-MONOHYDRATE, (MACROBID) 100 MG CAPSULE    Take 1 capsule (100 mg total) by mouth 2 (two) times daily. X 7 days     Rolan Bucco, MD 08/24/16 [phone]

## 2016-08-24 NOTE — ED Notes (Signed)
Ginger ale given per pt request. 

## 2016-08-26 LAB — URINE CULTURE: Culture: >=100000 — AB

## 2016-08-27 ENCOUNTER — Telehealth: Payer: Self-pay

## 2016-08-27 NOTE — Telephone Encounter (Signed)
Post ED Visit - Positive Culture Follow-up  Culture report reviewed by antimicrobial stewardship pharmacist:  []  Enzo BiNathan Batchelder, Pharm.D. []  Celedonio MiyamotoJeremy Frens, Pharm.D., BCPS []  Garvin FilaMike Maccia, Pharm.D. []  Georgina PillionElizabeth Martin, Pharm.D., BCPS []  GreenleafMinh Pham, VermontPharm.D., BCPS, AAHIVP []  Estella HuskMichelle Turner, Pharm.D., BCPS, AAHIVP []  Tennis Mustassie Stewart, Pharm.D. []  Sherle Poeob Vincent, [address]Pharm[address] J Arminger Pharm D Positive urine culture Treated with Nitrofurantoin, organism sensitive to the same and no further patient follow-up is required at this time.  Jerry CarasCullom, [place] 08/27/2016, 9:34 AM

## 2016-09-10 ENCOUNTER — Emergency Department (HOSPITAL_BASED_OUTPATIENT_CLINIC_OR_DEPARTMENT_OTHER): Payer: Self-pay

## 2016-09-10 ENCOUNTER — Emergency Department (HOSPITAL_BASED_OUTPATIENT_CLINIC_OR_DEPARTMENT_OTHER)
Admission: EM | Admit: 2016-09-10 | Discharge: 2016-09-10 | Disposition: A | Discharge status: Home / Self Care | Payer: Self-pay | Attending: Emergency Medicine | Admitting: Emergency Medicine

## 2016-09-10 ENCOUNTER — Encounter (HOSPITAL_BASED_OUTPATIENT_CLINIC_OR_DEPARTMENT_OTHER): Payer: Self-pay | Admitting: Emergency Medicine

## 2016-09-10 DIAGNOSIS — F17210 Nicotine dependence, cigarettes, uncomplicated: Secondary | ICD-10-CM | POA: Insufficient documentation

## 2016-09-10 DIAGNOSIS — J45909 Unspecified asthma, uncomplicated: Secondary | ICD-10-CM | POA: Insufficient documentation

## 2016-09-10 DIAGNOSIS — G510 Bell's palsy: Secondary | ICD-10-CM | POA: Insufficient documentation

## 2016-09-10 DIAGNOSIS — R519 Headache, unspecified: Secondary | ICD-10-CM

## 2016-09-10 DIAGNOSIS — M542 Cervicalgia: Secondary | ICD-10-CM

## 2016-09-10 DIAGNOSIS — F909 Attention-deficit hyperactivity disorder, unspecified type: Secondary | ICD-10-CM | POA: Insufficient documentation

## 2016-09-10 DIAGNOSIS — R51 Headache: Secondary | ICD-10-CM

## 2016-09-10 DIAGNOSIS — Z5181 Encounter for therapeutic drug level monitoring: Secondary | ICD-10-CM | POA: Insufficient documentation

## 2016-09-10 DIAGNOSIS — Z79899 Other long term (current) drug therapy: Secondary | ICD-10-CM | POA: Insufficient documentation

## 2016-09-10 LAB — CBC
HEMATOCRIT: 41.3 % (ref 36.0–46.0)
HEMOGLOBIN: 14.1 g/dL (ref 12.0–15.0)
MCH: 29.4 pg (ref 26.0–34.0)
MCHC: 34.1 g/dL (ref 30.0–36.0)
MCV: 86.2 fL (ref 78.0–100.0)
Platelets: 251 10*3/uL (ref 150–400)
RBC: 4.79 MIL/uL (ref 3.87–5.11)
RDW: 12.5 % (ref 11.5–15.5)
WBC: 8.3 10*3/uL (ref 4.0–10.5)

## 2016-09-10 LAB — COMPREHENSIVE METABOLIC PANEL
ALBUMIN: 3.8 g/dL (ref 3.5–5.0)
ALK PHOS: 85 U/L (ref 38–126)
ALT: 93 U/L — AB (ref 14–54)
ANION GAP: 6 (ref 5–15)
AST: 132 U/L — ABNORMAL HIGH (ref 15–41)
BUN: 18 mg/dL (ref 6–20)
CALCIUM: 8.8 mg/dL — AB (ref 8.9–10.3)
CHLORIDE: 105 mmol/L (ref 101–111)
CO2: 21 mmol/L — AB (ref 22–32)
CREATININE: 0.74 mg/dL (ref 0.44–1.00)
GFR calc Af Amer: >60 mL/min (ref >60)
GFR calc non Af Amer: >60 mL/min (ref >60)
GLUCOSE: 86 mg/dL (ref 65–99)
Potassium: 3.8 mmol/L (ref 3.5–5.1)
SODIUM: 132 mmol/L — AB (ref 135–145)
Total Bilirubin: 0.2 mg/dL — ABNORMAL LOW (ref 0.3–1.2)
Total Protein: 6.8 g/dL (ref 6.5–8.1)

## 2016-09-10 LAB — DIFFERENTIAL
BASOS ABS: 0 10*3/uL (ref 0.0–0.1)
Basophils Relative: 0 %
Eosinophils Absolute: 0.2 10*3/uL (ref 0.0–0.7)
Eosinophils Relative: 3 %
LYMPHS ABS: 3 10*3/uL (ref 0.7–4.0)
Lymphocytes Relative: 36 %
MONOS PCT: 9 %
Monocytes Absolute: 0.7 10*3/uL (ref 0.1–1.0)
NEUTROS ABS: 4.4 10*3/uL (ref 1.7–7.7)
NEUTROS PCT: 52 %

## 2016-09-10 LAB — APTT: aPTT: 30 seconds (ref 24–36)

## 2016-09-10 LAB — PROTIME-INR
INR: 0.93
Prothrombin Time: 12.5 seconds (ref 11.4–15.2)

## 2016-09-10 LAB — TROPONIN I: Troponin I: <0.03 ng/mL (ref <0.03)

## 2016-09-10 LAB — CBG MONITORING, ED: GLUCOSE-CAPILLARY: 84 mg/dL (ref 65–99)

## 2016-09-10 MED ORDER — METOCLOPRAMIDE HCL 5 MG/ML IJ SOLN
10.0000 mg | Freq: Once | INTRAMUSCULAR | Status: AC
  Administered 2016-09-10: 10 mg via INTRAVENOUS
  Filled 2016-09-10: qty 2

## 2016-09-10 MED ORDER — DIPHENHYDRAMINE HCL 50 MG/ML IJ SOLN
25.0000 mg | Freq: Once | INTRAMUSCULAR | Status: AC
  Administered 2016-09-10: 25 mg via INTRAVENOUS
  Filled 2016-09-10: qty 1

## 2016-09-10 MED ORDER — IOPAMIDOL (ISOVUE-370) INJECTION 76%
100.0000 mL | Freq: Once | INTRAVENOUS | Status: AC | PRN
  Administered 2016-09-10: 100 mL via INTRAVENOUS

## 2016-09-10 MED ORDER — HYPROMELLOSE (GONIOSCOPIC) 2.5 % OP SOLN: 1.0000 [drp] | Freq: Four times a day (QID) | OPHTHALMIC | 12 refills | Status: DC

## 2016-09-10 MED ORDER — DIPHENHYDRAMINE HCL 50 MG/ML IJ SOLN: 50.0000 mg | Freq: Once | INTRAMUSCULAR | Status: DC

## 2016-09-10 MED ORDER — PROCHLORPERAZINE EDISYLATE 5 MG/ML IJ SOLN
10.0000 mg | Freq: Once | INTRAMUSCULAR | Status: AC
  Administered 2016-09-10: 10 mg via INTRAVENOUS
  Filled 2016-09-10: qty 2

## 2016-09-10 MED ORDER — SODIUM CHLORIDE 0.9 % IV BOLUS (SEPSIS)
1000.0000 mL | Freq: Once | INTRAVENOUS | Status: AC
  Administered 2016-09-10: 1000 mL via INTRAVENOUS

## 2016-09-10 MED ORDER — PREDNISONE 10 MG (21) PO TBPK: 10.0000 mg | ORAL_TABLET | Freq: Every day | ORAL | 0 refills | Status: DC

## 2016-09-10 MED ORDER — KETOROLAC TROMETHAMINE 30 MG/ML IJ SOLN
30.0000 mg | Freq: Once | INTRAMUSCULAR | Status: AC
  Administered 2016-09-10: 30 mg via INTRAVENOUS
  Filled 2016-09-10: qty 1

## 2016-09-10 NOTE — ED Triage Notes (Signed)
Pt having left sided weakness, left facial droop and pain to left neck and arm since 3 am.  Pt states she has been sick at home with vomiting and headache.  Denies head injury or blood thinners.

## 2016-09-10 NOTE — ED Notes (Signed)
ED Provider at bedside. 

## 2016-09-10 NOTE — Discharge Instructions (Signed)
Please take steroids for your facial droop/Bell's palsy. Please use artifical tears to keep left eye moist. Tape the left eye closed at night time to keep your left eye from drying out and getting abrasions.    Our neurologist over the phone [facility], which you have declined. If you change your mind, or have worsening numbness/weakness, [facility].

## 2016-09-10 NOTE — ED Provider Notes (Signed)
[facility] Provider Note   CSN: [id number] Arrival date & time: 09/10/16  1627     History   Chief Complaint Chief Complaint  Patient presents with  . Facial Droop  . Extremity Weakness    HPI Amy Walls is a 36 y.o. female.  HPI 36 year old female who presents with facial droop and left-sided weakness. She has a history of anxiety, ADHD, depression, fibromyalgia, and migraine headaches. States that she just got over a GI illness over a week ago. Has been in her usual state of health. This morning woke up with left-sided headache and neck pain at 3 AM with nausea and vomiting. States that this is a little atypical for her migraine headaches. At 7 AM noticed that she had a left-sided facial droop and at that same time was also having left-sided weakness of the arm and leg. No vision or speech changes. No abdominal pain, chest pain, difficulty breathing, fevers or chills.   Past Medical History:  Diagnosis Date  . ADHD (attention deficit hyperactivity disorder)   . Anxiety   . Arthritis    "inflammation of all my joints"  . Asthma   . Bowel obstruction 2007   "upper bowel"  . Chronic bronchitis    "yearly"  . Chronic mid back pain   . Complication of anesthesia 2007   "lost me on the table when I had upper bowel obstruction"  . Constipation, chronic   . Depression   . Fibromyalgia   . Hepatitis C   . Hypoglycemia    "my sugar runs low alot"  . Kidney infection    "often"  . Migraines 12/28/11   "@ least once/wk"  . PONV (postoperative nausea and vomiting)   . Recurrent UTI     Patient Active Problem List   Diagnosis Date Noted  . Nausea vomiting and diarrhea 08/29/2014  . Intractable vomiting 08/29/2014  . Diarrhea 08/29/2014  . Intussusception (HCC) 01/01/2012  . Acute back pain 12/28/2011  . Abdominal pain 12/28/2011  . Transaminitis 12/28/2011  . Asthma 12/28/2011  . IBS (irritable bowel syndrome) 12/28/2011  . Tobacco abuse 12/28/2011     Past Surgical History:  Procedure Laterality Date  . BOWEL RESECTION  04/2006   "upper bowel"  . OVARY SURGERY  ~ 2002   "had 10 inches blood built up; they opened me up and got the blood out"    OB History    No data available       Home Medications    Prior to Admission medications   Medication Sig Start Date End Date Taking? Authorizing Provider  etonogestrel (IMPLANON) 68 MG IMPL implant 1 each by Subdermal route once.    Historical Provider, MD  gabapentin (NEURONTIN) 300 MG capsule Take 300 mg by mouth 3 (three) times daily.    Historical Provider, MD  hydroxypropyl methylcellulose / hypromellose (ISOPTO TEARS / GONIOVISC) 2.5 % ophthalmic solution Place 1 drop into the left eye 4 (four) times daily. 09/10/16   Lavera Guise, MD  ibuprofen (ADVIL,MOTRIN) 400 MG tablet Take 1 tablet (400 mg total) by mouth every 6 (six) hours as needed for moderate pain. 04/13/16   Shaune Pollack, MD  metoCLOPramide (REGLAN) 10 MG tablet Take 1 tablet (10 mg total) by mouth every 6 (six) hours as needed for nausea (nausea/headache). 08/24/16   Rolan Bucco, MD  nitrofurantoin, macrocrystal-monohydrate, (MACROBID) 100 MG capsule Take 1 capsule (100 mg total) by mouth 2 (two) times daily. X 7 days 08/24/16  Rolan Bucco, MD  predniSONE (STERAPRED UNI-PAK 21 TAB) 10 MG (21) TBPK tablet Take 1 tablet (10 mg total) by mouth daily. Take 6 tabs by mouth daily  for 2 days, then 5 tabs for 2 days, then 4 tabs for 2 days, then 3 tabs for 2 days, 2 tabs for 2 days, then 1 tab by mouth daily for 2 days 09/10/16   Lavera Guise, MD  QUEtiapine (SEROQUEL) 50 MG tablet Take 50 mg by mouth at bedtime.    Historical Provider, MD    Family History Family History  Problem Relation Age of Onset  . Breast cancer Maternal Grandmother   . Colon cancer Maternal Grandmother   . Irritable bowel syndrome Maternal Grandmother   . Diabetes Mother   . Heart disease Father   . Cancer Maternal Aunt     colon cancer   .  Vision loss Maternal Grandfather     Social History Social History  Substance Use Topics  . Smoking status: Current Every Day Smoker    Packs/day: 1.00    Years: 16.00    Types: Cigarettes  . Smokeless tobacco: Never Used     Comment: tobacco handout given 01/31/2012  . Alcohol use Yes     Comment: occ     Allergies   Cinnamon; Ciprofloxacin; Levaquin [levofloxacin]; Morphine and related; Penicillins; Toradol [ketorolac tromethamine]; Ultram [tramadol]; Acetaminophen; and Sulfa antibiotics   Review of Systems Review of Systems 10/14 systems reviewed and are negative other than those stated in the HPI   Physical Exam Updated Vital Signs BP 121/77   Pulse 65   Resp 13   LMP 09/03/2016   SpO2 94%   Physical Exam Physical Exam  Nursing note and vitals reviewed. Constitutional: non-toxic, and in no acute distress Head: Normocephalic and atraumatic.  Mouth/Throat: Oropharynx is clear and moist.  Neck: Normal range of motion. Neck supple.  Cardiovascular: Normal rate and regular rhythm.   Pulmonary/Chest: Effort normal and breath sounds normal.  Abdominal: Soft. There is no tenderness. There is no rebound and no guarding.  Musculoskeletal: Normal range of motion.  Skin: Skin is warm and dry.  Psychiatric: Cooperative  Neurological:  Alert, oriented to person, place, time, and situation. Memory grossly in tact. Fluent speech. No dysarthria or aphasia.  Cranial nerves: VF are full. Fundoscopic exam-unable to get good visualization of the discs. Pupils are symmetric, and reactive to light. EOMI without nystagmus. No gaze deviation. Facial muscles symmetric with activation. Sensation to light touch over face in tact bilaterally. Hearing grossly in tact. Palate elevates symmetrically. Head turn and shoulder shrug are intact. Tongue midline.  Reflexes defered.  Muscle bulk and tone normal. No true pronator drift but tremoring of LUE w/ weak handgrip limited by poor effort. Moves  all extremities symmetrically. Sensation to light touch is in tact throughout in bilateral upper and lower extremities. Patchy diminished sensation to light touch over left outer thigh, and skin around left elbow Coordination reveals no dysmetria with finger to nose of RUE. Tremoring with finger to nose of the LUE.     ED Treatments / Results  Labs (all labs ordered are listed, but only abnormal results are displayed) Labs Reviewed  COMPREHENSIVE METABOLIC PANEL - Abnormal; Notable for the following:       Result Value   Sodium 132 (*)    CO2 21 (*)    Calcium 8.8 (*)    AST 132 (*)    ALT 93 (*)    Total  Bilirubin 0.2 (*)    All other components within normal limits  PROTIME-INR  APTT  CBC  DIFFERENTIAL  TROPONIN I  CBG MONITORING, ED    EKG  EKG Interpretation  Date/Time:  Friday September 10 2016 16:40:33 EST Ventricular Rate:  72 PR Interval:    QRS Duration: 102 QT Interval:  406 QTC Calculation: 445 R Axis:   45 Text Interpretation:  Sinus rhythm No ischemic changes. No prior EKg  Confirmed by Latash Nouri MD, Terrah Decoster [id number]) on 09/10/2016 4:52:18 PM       Radiology Ct Angio Head W Or Wo Contrast  Result Date: 09/10/2016 CLINICAL DATA:  LEFT neck pain and LEFT facial droop beginning at 3 a.m., vomiting and headache. History of migraines, hypoglycemia and, hepatitis-C. EXAM: CT ANGIOGRAPHY HEAD AND NECK TECHNIQUE: Multidetector CT imaging of the head and neck was performed using the standard protocol during bolus administration of intravenous contrast. Multiplanar CT image reconstructions and MIPs were obtained to evaluate the vascular anatomy. Carotid stenosis measurements (when applicable) are obtained utilizing NASCET criteria, using the distal internal carotid diameter as the denominator. CONTRAST:  100 cc Isovue 370 COMPARISON:  CT HEAD September 10, 2016 at 1652 hours FINDINGS: CTA NECK AORTIC ARCH: Normal appearance of the thoracic arch, normal branch pattern. The origins of  the innominate, left Common carotid artery and subclavian artery are widely patent. RIGHT CAROTID SYSTEM: Common carotid artery is widely patent, coursing in a straight line fashion. Normal appearance of the carotid bifurcation without hemodynamically significant stenosis by NASCET criteria. Normal appearance of the included internal carotid artery. LEFT CAROTID SYSTEM: Common carotid artery is widely patent, coursing in a straight line fashion. Normal appearance of the carotid bifurcation without hemodynamically significant stenosis by NASCET criteria. Normal appearance of the included internal carotid artery. VERTEBRAL ARTERIES:RIGHT vertebral artery is dominant. Normal appearance of the vertebral arteries, which appear widely patent. SKELETON: No acute osseous process though bone windows have not been submitted. Broad reversed cervical lordosis. Uncovertebral hypertrophy resulting in moderate to severe RIGHT C5-6 neural foraminal narrowing. OTHER NECK: Soft tissues of the neck are non-acute though, not tailored for evaluation. CTA HEAD ANTERIOR CIRCULATION: Normal appearance of the cervical internal carotid arteries, petrous, cavernous and supra clinoid internal carotid arteries. Widely patent anterior communicating artery. Normal appearance of the anterior and middle cerebral arteries. No large vessel occlusion, hemodynamically significant stenosis, dissection, luminal irregularity, contrast extravasation or aneurysm. POSTERIOR CIRCULATION: Normal appearance of the vertebral arteries, vertebrobasilar junction and basilar artery, as well as main branch vessels. Normal appearance of the posterior cerebral arteries. Robust LEFT posterior communicating artery present. No large vessel occlusion, hemodynamically significant stenosis, dissection, luminal irregularity, contrast extravasation or aneurysm. VENOUS SINUSES: Major dural venous sinuses are patent though not tailored for evaluation on this angiographic  examination. ANATOMIC VARIANTS: None. DELAYED PHASE: Normal postcontrast CT HEAD. IMPRESSION: Negative CTA HEAD and CTA NECK. Moderate to severe RIGHT C5-6 neural foraminal narrowing. Electronically Signed   By: Awilda Metroourtnay  Bloomer M.D.   On: 09/10/2016 18:08   Ct Head Wo Contrast  Result Date: 09/10/2016 CLINICAL DATA:  Dizziness, left arm weakness, facial numbness, possible stroke EXAM: CT HEAD WITHOUT CONTRAST TECHNIQUE: Contiguous axial images were obtained from the base of the skull through the vertex without intravenous contrast. COMPARISON:  02/11/2009 FINDINGS: Brain: No evidence of acute infarction, hemorrhage, hydrocephalus, extra-axial collection or mass lesion/mass effect. Vascular: No hyperdense vessel or unexpected calcification. Skull: Normal. Negative for fracture or focal lesion. Sinuses/Orbits: No acute finding. Other: None. IMPRESSION: Normal  head CT. Electronically Signed   By: Charline Bills M.D.   On: 09/10/2016 16:57   Ct Angio Neck W Or Wo Contrast  Result Date: 09/10/2016 CLINICAL DATA:  LEFT neck pain and LEFT facial droop beginning at 3 a.m., vomiting and headache. History of migraines, hypoglycemia and, hepatitis-C. EXAM: CT ANGIOGRAPHY HEAD AND NECK TECHNIQUE: Multidetector CT imaging of the head and neck was performed using the standard protocol during bolus administration of intravenous contrast. Multiplanar CT image reconstructions and MIPs were obtained to evaluate the vascular anatomy. Carotid stenosis measurements (when applicable) are obtained utilizing NASCET criteria, using the distal internal carotid diameter as the denominator. CONTRAST:  100 cc Isovue 370 COMPARISON:  CT HEAD September 10, 2016 at 1652 hours FINDINGS: CTA NECK AORTIC ARCH: Normal appearance of the thoracic arch, normal branch pattern. The origins of the innominate, left Common carotid artery and subclavian artery are widely patent. RIGHT CAROTID SYSTEM: Common carotid artery is widely patent, coursing  in a straight line fashion. Normal appearance of the carotid bifurcation without hemodynamically significant stenosis by NASCET criteria. Normal appearance of the included internal carotid artery. LEFT CAROTID SYSTEM: Common carotid artery is widely patent, coursing in a straight line fashion. Normal appearance of the carotid bifurcation without hemodynamically significant stenosis by NASCET criteria. Normal appearance of the included internal carotid artery. VERTEBRAL ARTERIES:RIGHT vertebral artery is dominant. Normal appearance of the vertebral arteries, which appear widely patent. SKELETON: No acute osseous process though bone windows have not been submitted. Broad reversed cervical lordosis. Uncovertebral hypertrophy resulting in moderate to severe RIGHT C5-6 neural foraminal narrowing. OTHER NECK: Soft tissues of the neck are non-acute though, not tailored for evaluation. CTA HEAD ANTERIOR CIRCULATION: Normal appearance of the cervical internal carotid arteries, petrous, cavernous and supra clinoid internal carotid arteries. Widely patent anterior communicating artery. Normal appearance of the anterior and middle cerebral arteries. No large vessel occlusion, hemodynamically significant stenosis, dissection, luminal irregularity, contrast extravasation or aneurysm. POSTERIOR CIRCULATION: Normal appearance of the vertebral arteries, vertebrobasilar junction and basilar artery, as well as main branch vessels. Normal appearance of the posterior cerebral arteries. Robust LEFT posterior communicating artery present. No large vessel occlusion, hemodynamically significant stenosis, dissection, luminal irregularity, contrast extravasation or aneurysm. VENOUS SINUSES: Major dural venous sinuses are patent though not tailored for evaluation on this angiographic examination. ANATOMIC VARIANTS: None. DELAYED PHASE: Normal postcontrast CT HEAD. IMPRESSION: Negative CTA HEAD and CTA NECK. Moderate to severe RIGHT C5-6 neural  foraminal narrowing. Electronically Signed   By: Awilda Metro M.D.   On: 09/10/2016 18:08    Procedures Procedures (including critical care time)  Medications Ordered in ED Medications  sodium chloride 0.9 % bolus 1,000 mL (0 mLs Intravenous Stopped 09/10/16 1836)  prochlorperazine (COMPAZINE) injection 10 mg (10 mg Intravenous Given 09/10/16 1720)  diphenhydrAMINE (BENADRYL) injection 25 mg (25 mg Intravenous Given 09/10/16 1722)  iopamidol (ISOVUE-370) 76 % injection 100 mL (100 mLs Intravenous Contrast Given 09/10/16 1730)  ketorolac (TORADOL) 30 MG/ML injection 30 mg (30 mg Intravenous Given 09/10/16 1836)  metoCLOPramide (REGLAN) injection 10 mg (10 mg Intravenous Given 09/10/16 1838)     Initial Impression / Assessment and Plan / ED Course  I have reviewed the triage vital signs and the nursing notes.  Pertinent labs & imaging results that were available during my care of the patient were reviewed by me and considered in my medical decision making (see chart for details).     Presenting with facial droop, which appears to be a Bell's  palsy on exam as forehead is involved. However she does complain of patchy numbness that is nonfocal as well as weakness although on physical exam seems more consistent with giveaway weakness. CT head and subsequent CTA head and neck overall are unremarkable. Question atypical migraine headache initially and received migraine cocktail without significant improvement in symptoms. She subsequently is requesting discharge home. I did discuss this patient with neurology, Dr. Amada Jupiter. He does recommend MRI of the brain given her inconsistent history and questionable exam. I discussed transfer to [facility] for MRI of the brain with this patient. She is refusing at this time. Has capacity to decline, and is able to express that she still has not been fully and able to rule out for acute neurological condition such as stroke. Still requests discharge. Strict  return and follow-up instructions reviewed. She expressed understanding of all discharge instructions and felt comfortable with the plan of care.   Final Clinical Impressions(s) / ED Diagnoses   Final diagnoses:  Neck pain  Bell's palsy  Nonintractable headache, unspecified chronicity pattern, unspecified headache type    New Prescriptions New Prescriptions   HYDROXYPROPYL METHYLCELLULOSE / HYPROMELLOSE (ISOPTO TEARS / GONIOVISC) 2.5 % OPHTHALMIC SOLUTION    Place 1 drop into the left eye 4 (four) times daily.   PREDNISONE (STERAPRED UNI-PAK 21 TAB) 10 MG (21) TBPK TABLET    Take 1 tablet (10 mg total) by mouth daily. Take 6 tabs by mouth daily  for 2 days, then 5 tabs for 2 days, then 4 tabs for 2 days, then 3 tabs for 2 days, 2 tabs for 2 days, then 1 tab by mouth daily for 2 days     Lavera Guise, MD 09/10/16 1921

## 2016-09-10 NOTE — ED Notes (Signed)
Patient transported to CT 

## 2016-09-14 ENCOUNTER — Encounter (HOSPITAL_COMMUNITY): Payer: Self-pay | Admitting: Emergency Medicine

## 2016-09-14 ENCOUNTER — Emergency Department (HOSPITAL_COMMUNITY)
Admission: EM | Admit: 2016-09-14 | Discharge: 2016-09-14 | Disposition: A | Discharge status: Home / Self Care | Payer: Medicaid Other | Attending: Emergency Medicine | Admitting: Emergency Medicine

## 2016-09-14 ENCOUNTER — Emergency Department (HOSPITAL_COMMUNITY): Payer: Medicaid Other

## 2016-09-14 DIAGNOSIS — R29810 Facial weakness: Secondary | ICD-10-CM

## 2016-09-14 DIAGNOSIS — F17210 Nicotine dependence, cigarettes, uncomplicated: Secondary | ICD-10-CM | POA: Insufficient documentation

## 2016-09-14 DIAGNOSIS — G510 Bell's palsy: Secondary | ICD-10-CM

## 2016-09-14 DIAGNOSIS — Z79899 Other long term (current) drug therapy: Secondary | ICD-10-CM | POA: Insufficient documentation

## 2016-09-14 DIAGNOSIS — F909 Attention-deficit hyperactivity disorder, unspecified type: Secondary | ICD-10-CM | POA: Insufficient documentation

## 2016-09-14 DIAGNOSIS — R51 Headache: Secondary | ICD-10-CM

## 2016-09-14 DIAGNOSIS — J45909 Unspecified asthma, uncomplicated: Secondary | ICD-10-CM | POA: Insufficient documentation

## 2016-09-14 DIAGNOSIS — R519 Headache, unspecified: Secondary | ICD-10-CM

## 2016-09-14 MED ORDER — GADOBENATE DIMEGLUMINE 529 MG/ML IV SOLN
15.0000 mL | Freq: Once | INTRAVENOUS | Status: AC | PRN
  Administered 2016-09-14: 15 mL via INTRAVENOUS

## 2016-09-14 MED ORDER — ONDANSETRON 4 MG PO TBDP
4.0000 mg | ORAL_TABLET | Freq: Once | ORAL | Status: AC
  Administered 2016-09-14: 4 mg via ORAL
  Filled 2016-09-14: qty 1

## 2016-09-14 MED ORDER — OXYCODONE HCL 5 MG PO TABS
5.0000 mg | ORAL_TABLET | Freq: Once | ORAL | Status: AC
  Administered 2016-09-14: 5 mg via ORAL
  Filled 2016-09-14: qty 1

## 2016-09-14 MED ORDER — HYDROCODONE-ACETAMINOPHEN 5-325 MG PO TABS: 2.0000 | ORAL_TABLET | Freq: Once | ORAL | Status: DC

## 2016-09-14 MED ORDER — VALACYCLOVIR HCL 1 G PO TABS: 1000.0000 mg | ORAL_TABLET | Freq: Three times a day (TID) | ORAL | 0 refills | Status: DC

## 2016-09-14 NOTE — ED Triage Notes (Signed)
States [facility] and sdx with bells palsy was told to come and get a MRI but she could not come till now, feels her s/s  Are not getting better

## 2016-09-14 NOTE — ED Notes (Signed)
Pt verbalizes understanding of discharge instructions and follow up information. NAD. Ambulatory at discharge.

## 2016-09-14 NOTE — Discharge Instructions (Signed)
SEEK IMMEDIATE MEDICAL CARE IF:  Another part of your body feels weak or numb. You have difficulty swallowing. You have a fever along with symptoms of Bell palsy. You develop neck pain.  Get help right away if: Your headache becomes severe. You have repeated vomiting. You have a stiff neck. You have a loss of vision. You have problems with speech. You have pain in the eye or ear. You have muscular weakness or loss of muscle control. You lose your balance or have trouble walking. You feel faint or pass out. You have confusion.

## 2016-09-14 NOTE — ED Provider Notes (Signed)
[facility]-EMERGENCY DEPT Provider Note   CSN: [id number] Arrival date & time: 09/14/16  [id number]     History   Chief Complaint Chief Complaint  Patient presents with  . Follow-up    HPI Amy Walls is a 36 y.o. female  She has a history of chronic pain, history of hep C, fibromyalgia, recurrent migraine headaches. Patient was seen on 09/10/2016 at [facility] [place] with diagnosis of Bell's palsy. However, she had some abnormal neurologic findings and was asked to follow up for an MRI. Patient presented today. Her facial droop has not improved. She is on a steroid Dosepak. She also complains of a bad headache today. She denies any photophobia, phonophobia, unilateral weakness, neck stiffness, fevers, or other abnormalities. He states that her headache is the same as her normal headaches. She denies nausea, vomiting.  HPI  Past Medical History:  Diagnosis Date  . ADHD (attention deficit hyperactivity disorder)   . Anxiety   . Arthritis    "inflammation of all my joints"  . Asthma   . Bowel obstruction 2007   "upper bowel"  . Chronic bronchitis    "yearly"  . Chronic mid back pain   . Complication of anesthesia 2007   "lost me on the table when I had upper bowel obstruction"  . Constipation, chronic   . Depression   . Fibromyalgia   . Hepatitis C   . Hypoglycemia    "my sugar runs low alot"  . Kidney infection    "often"  . Migraines 12/28/11   "@ least once/wk"  . PONV (postoperative nausea and vomiting)   . Recurrent UTI     Patient Active Problem List   Diagnosis Date Noted  . Nausea vomiting and diarrhea 08/29/2014  . Intractable vomiting 08/29/2014  . Diarrhea 08/29/2014  . Intussusception (HCC) 01/01/2012  . Acute back pain 12/28/2011  . Abdominal pain 12/28/2011  . Transaminitis 12/28/2011  . Asthma 12/28/2011  . IBS (irritable bowel syndrome) 12/28/2011  . Tobacco abuse 12/28/2011    Past Surgical History:  Procedure Laterality Date  . BOWEL RESECTION   04/2006   "upper bowel"  . OVARY SURGERY  ~ 2002   "had 10 inches blood built up; they opened me up and got the blood out"    OB History    No data available       Home Medications    Prior to Admission medications   Medication Sig Start Date End Date Taking? Authorizing Provider  dicyclomine (BENTYL) 10 MG capsule Take 10 mg by mouth 3 (three) times daily as needed for spasms. 04/27/16 04/27/17 Yes Historical Provider, MD  etonogestrel (IMPLANON) 68 MG IMPL implant 1 each by Subdermal route once.   Yes Historical Provider, MD  gabapentin (NEURONTIN) 300 MG capsule Take 300 mg by mouth 3 (three) times daily.   Yes Historical Provider, MD  hydroxypropyl methylcellulose / hypromellose (ISOPTO TEARS / GONIOVISC) 2.5 % ophthalmic solution Place 1 drop into the left eye 4 (four) times daily. 09/10/16  Yes Lavera Guiseana Duo Liu, MD  hydrOXYzine (VISTARIL) 50 MG capsule Take 50 mg by mouth 3 (three) times daily as needed for itching.   Yes Historical Provider, MD  ibuprofen (ADVIL,MOTRIN) 400 MG tablet Take 1 tablet (400 mg total) by mouth every 6 (six) hours as needed for moderate pain. 04/13/16  Yes Shaune Pollackameron Isaacs, MD  pantoprazole (PROTONIX) 40 MG tablet Take 40 mg by mouth daily.   Yes Historical Provider, MD  predniSONE (STERAPRED UNI-PAK 21  TAB) 10 MG (21) TBPK tablet Take 1 tablet (10 mg total) by mouth daily. Take 6 tabs by mouth daily  for 2 days, then 5 tabs for 2 days, then 4 tabs for 2 days, then 3 tabs for 2 days, 2 tabs for 2 days, then 1 tab by mouth daily for 2 days 09/10/16  Yes Lavera Guise, MD  QUEtiapine (SEROQUEL) 50 MG tablet Take 50 mg by mouth at bedtime.   Yes Historical Provider, MD  sertraline (ZOLOFT) 100 MG tablet Take 100 mg by mouth daily.   Yes Historical Provider, MD  metoCLOPramide (REGLAN) 10 MG tablet Take 1 tablet (10 mg total) by mouth every 6 (six) hours as needed for nausea (nausea/headache). Patient not taking: Reported on 09/14/2016 08/24/16   Rolan Bucco, MD    valACYclovir (VALTREX) 1000 MG tablet Take 1 tablet (1,000 mg total) by mouth 3 (three) times daily. 09/14/16   Arthor Captain, PA-C    Family History Family History  Problem Relation Age of Onset  . Breast cancer Maternal Grandmother   . Colon cancer Maternal Grandmother   . Irritable bowel syndrome Maternal Grandmother   . Diabetes Mother   . Heart disease Father   . Cancer Maternal Aunt     colon cancer   . Vision loss Maternal Grandfather     Social History Social History  Substance Use Topics  . Smoking status: Current Every Day Smoker    Packs/day: 1.00    Years: 16.00    Types: Cigarettes  . Smokeless tobacco: Never Used     Comment: tobacco handout given 01/31/2012  . Alcohol use Yes     Comment: occ     Allergies   Cinnamon; Ciprofloxacin; Levaquin [levofloxacin]; Morphine and related; Penicillins; Toradol [ketorolac tromethamine]; Ultram [tramadol]; Acetaminophen; and Sulfa antibiotics   Review of Systems Review of Systems  Ten systems reviewed and are negative for acute change, except as noted in the HPI.   Physical Exam Updated Vital Signs BP 118/84 (BP Location: Right Arm)   Pulse 72   Temp 98.5 F (36.9 C) (Oral)   Resp 18   Ht 4\' 11"  (1.499 m)   Wt 77.1 kg   LMP 09/03/2016   SpO2 100%   BMI 34.34 kg/m   Physical Exam  Constitutional: She is oriented to person, place, and time. She appears well-developed and well-nourished. No distress.  HENT:  Head: Normocephalic and atraumatic.  Mouth/Throat: Oropharynx is clear and moist.  Eyes: Conjunctivae and EOM are normal. Pupils are equal, round, and reactive to light. No scleral icterus.  No horizontal, vertical or rotational nystagmus  Neck: Normal range of motion. Neck supple.  Full active and passive ROM without pain No midline or paraspinal tenderness No nuchal rigidity or meningeal signs  Cardiovascular: Normal rate, regular rhythm and intact distal pulses.   Pulmonary/Chest: Effort normal  and breath sounds normal. No respiratory distress. She has no wheezes. She has no rales.  Abdominal: Soft. Bowel sounds are normal. There is no tenderness. There is no rebound and no guarding.  Musculoskeletal: Normal range of motion.  Lymphadenopathy:    She has no cervical adenopathy.  Neurological: She is alert and oriented to person, place, and time. No cranial nerve deficit. She exhibits normal muscle tone. Coordination normal.  Mental Status:  Alert, oriented, thought content appropriate. Speech fluent without evidence of aphasia. Able to follow 2 step commands without difficulty.  Cranial Nerves:  II:  Peripheral visual fields grossly normal, pupils equal,  round, reactive to light III,IV, ZO:XWRUE-[id number] motions intact bilaterally  V,VII: Left Facial droop involving the forehead, facial light touch sensation equal VIII: hearing grossly normal bilaterally  IX,X: midline uvula rise  XI: bilateral shoulder shrug equal and strong XII: midline tongue extension  Motor:  5/5 in upper and lower extremities bilaterally including strong and equal grip strength and dorsiflexion/plantar flexion Sensory: Pinprick and light touch normal in all extremities.  Cerebellar: normal finger-to-nose with bilateral upper extremities Gait: normal gait and balance CV: distal pulses palpable throughout   Skin: Skin is warm and dry. No rash noted. She is not diaphoretic.  Psychiatric: She has a normal mood and affect. Her behavior is normal. Judgment and thought content normal.  Nursing note and vitals reviewed.    ED Treatments / Results  Labs (all labs ordered are listed, but only abnormal results are displayed) Labs Reviewed - No data to display  EKG  EKG Interpretation None       Radiology Mr Melven Sartorius Wo Contrast  Result Date: 09/14/2016 CLINICAL DATA:  New onset of facial droop and left-sided weakness. Seen at [facility] 4 days ago and diagnosed with Bell's palsy. EXAM: MRI  HEAD WITHOUT AND WITH CONTRAST TECHNIQUE: Multiplanar, multiecho pulse sequences of the brain and surrounding structures were obtained without and with intravenous contrast. CONTRAST:  15mL MULTIHANCE GADOBENATE DIMEGLUMINE 529 MG/ML IV SOLN COMPARISON:  CT head without contrast 09/10/2016. CTA of the head and neck 09/10/2016. FINDINGS: Brain: No acute infarct, hemorrhage, or mass lesion is present. The ventricles are of normal size. No significant extraaxial fluid collection is present. The brainstem and cerebellum are normal. Postcontrast images demonstrate no pathologic enhancement. Vascular: Flow is present in the major intracranial arteries. Skull and upper cervical spine: The skullbase is normal. The craniocervical junction is within normal limits. Midline sagittal structures are unremarkable. A disc osteophyte complex at C4-5 is asymmetric to the right. Sinuses/Orbits: The paranasal sinuses are clear. Globes and orbits are within normal limits. Other: Dedicated imaging of the internal auditory canals demonstrates a distinct appearance of the seventh and eighth cranial nerves. The inner ear structures are normally formed. Asymmetric enhancement is present within the left facial nerve compatible with Bell's palsy. IMPRESSION: 1. Asymmetric enhancement of the left facial nerve without focal mass lesion. There is enhancement both within the canal and in the descending segment. Findings are compatible with the clinical diagnosis of Bell's palsy. 2. Normal MRI appearance of the brain. 3. No significant sinus disease. Electronically Signed   By: Marin Roberts M.D.   On: 09/14/2016 11:54    Procedures Procedures (including critical care time)  Medications Ordered in ED Medications  HYDROcodone-acetaminophen (NORCO/VICODIN) 5-325 MG per tablet 2 tablet (not administered)  gadobenate dimeglumine (MULTIHANCE) injection 15 mL (15 mLs Intravenous Contrast Given 09/14/16 1115)     Initial Impression /  Assessment and Plan / ED Course  I have reviewed the triage vital signs and the nursing notes.  Pertinent labs & imaging results that were available during my care of the patient were reviewed by me and considered in my medical decision making (see chart for details).     Patient with Bell's palsy. I will add on Valtrex. Headache. Treated with 1 immediate release oxycodone and discharged. Patient given referral to neurology outpatient. This was handwritten on the patient's AVS. Pt HA treated and improved while in ED.  Presentation is like pts typical HA and non concerning for [facility], ICH, Meningitis, or temporal arteritis. Pt  is afebrile with no focal neuro deficits, nuchal rigidity, or change in vision. Pt is to follow up with PCP to discuss prophylactic medication. Pt verbalizes understanding and is agreeable with plan to dc.    Final Clinical Impressions(s) / ED Diagnoses   Final diagnoses:  Right-sided Bell's palsy  Bad headache    New Prescriptions New Prescriptions   VALACYCLOVIR (VALTREX) 1000 MG TABLET    Take 1 tablet (1,000 mg total) by mouth 3 (three) times daily.     Arthor Captain, PA-C 09/14/16 1254    Doug Sou, MD 09/14/16 [phone]

## 2016-11-17 ENCOUNTER — Encounter (HOSPITAL_BASED_OUTPATIENT_CLINIC_OR_DEPARTMENT_OTHER): Payer: Self-pay

## 2016-11-17 ENCOUNTER — Emergency Department (HOSPITAL_BASED_OUTPATIENT_CLINIC_OR_DEPARTMENT_OTHER)
Admission: EM | Admit: 2016-11-17 | Discharge: 2016-11-17 | Disposition: A | Discharge status: Home / Self Care | Payer: Medicaid Other | Attending: Emergency Medicine | Admitting: Emergency Medicine

## 2016-11-17 DIAGNOSIS — R110 Nausea: Secondary | ICD-10-CM | POA: Insufficient documentation

## 2016-11-17 DIAGNOSIS — R509 Fever, unspecified: Secondary | ICD-10-CM | POA: Insufficient documentation

## 2016-11-17 DIAGNOSIS — F909 Attention-deficit hyperactivity disorder, unspecified type: Secondary | ICD-10-CM | POA: Insufficient documentation

## 2016-11-17 DIAGNOSIS — J45909 Unspecified asthma, uncomplicated: Secondary | ICD-10-CM | POA: Insufficient documentation

## 2016-11-17 DIAGNOSIS — F17210 Nicotine dependence, cigarettes, uncomplicated: Secondary | ICD-10-CM | POA: Insufficient documentation

## 2016-11-17 DIAGNOSIS — R102 Pelvic and perineal pain: Secondary | ICD-10-CM | POA: Insufficient documentation

## 2016-11-17 DIAGNOSIS — R1012 Left upper quadrant pain: Secondary | ICD-10-CM | POA: Insufficient documentation

## 2016-11-17 DIAGNOSIS — R1011 Right upper quadrant pain: Secondary | ICD-10-CM | POA: Insufficient documentation

## 2016-11-17 DIAGNOSIS — N939 Abnormal uterine and vaginal bleeding, unspecified: Secondary | ICD-10-CM | POA: Insufficient documentation

## 2016-11-17 LAB — URINALYSIS, ROUTINE W REFLEX MICROSCOPIC
BILIRUBIN URINE: NEGATIVE
Glucose, UA: NEGATIVE mg/dL
HGB URINE DIPSTICK: NEGATIVE
Ketones, ur: NEGATIVE mg/dL
Nitrite: POSITIVE — AB
PH: 5.5 (ref 5.0–8.0)
Protein, ur: NEGATIVE mg/dL
SPECIFIC GRAVITY, URINE: 1.022 (ref 1.005–1.030)

## 2016-11-17 LAB — URINALYSIS, MICROSCOPIC (REFLEX): RBC / HPF: NONE SEEN RBC/hpf (ref 0–5)

## 2016-11-17 LAB — WET PREP, GENITAL
Sperm: NONE SEEN
Trich, Wet Prep: NONE SEEN
YEAST WET PREP: NONE SEEN

## 2016-11-17 LAB — PREGNANCY, URINE: Preg Test, Ur: NEGATIVE

## 2016-11-17 MED ORDER — IBUPROFEN 800 MG PO TABS
800.0000 mg | ORAL_TABLET | Freq: Once | ORAL | Status: AC
  Administered 2016-11-17: 800 mg via ORAL
  Filled 2016-11-17: qty 1

## 2016-11-17 MED ORDER — IBUPROFEN 800 MG PO TABS: 800.0000 mg | ORAL_TABLET | Freq: Three times a day (TID) | ORAL | 0 refills | Status: DC

## 2016-11-17 NOTE — ED Provider Notes (Signed)
[facility] Provider Note   CSN: [id number] Arrival date & time: 11/17/16  1552  By signing my name below, I, Amy NeighborsMaurice Deon Copeland Jr., attest that this documentation has been prepared under the direction and in the presence of No att. providers found. Electronically signed: [name], ED Scribe. 12/01/16. 11:22 AM.    History   Chief Complaint Chief Complaint  Patient presents with  . Abdominal Pain    HPI [name] is a 36 y.o. female with hx of IBS who presents to the Emergency Department complaining of worsening, mild to moderate suprapubic abdominal pain with onset x3 days. Pt states that she has had suprapubic abdominal pain and lower back pain bilaterally with urination for the past x3 days. She reports subjective tactile fever, chills, nausea and vaginal bleeding. She denies any modifying factors. Pt denies diarrhea, constipation. Of note, pt reports hx of kidney infection in the past x6 months. She states that her last antibiotic use was greater than x6 months ago. 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 She states that she has abnormal spotting due to Implanon implant.   The history is provided by the patient. No language interpreter was used.    Past Medical History:  Diagnosis Date  . ADHD (attention deficit hyperactivity disorder)   . Anxiety   . Arthritis    "inflammation of all my joints"  . Asthma   . Bowel obstruction 2007   "upper bowel"  . Chronic bronchitis    "yearly"  . Chronic mid back pain   . Complication of anesthesia 2007   "lost me on the table when I had upper bowel obstruction"  . Constipation, chronic   . Depression   . Fibromyalgia   . Hepatitis C   . Heroin abuse   . Hypoglycemia    "my sugar runs low alot"  . IV drug user   . Kidney infection    "often"  . Migraines 12/28/11   "@ least once/wk"  . PONV  (postoperative nausea and vomiting)   . Recurrent UTI     Patient Active Problem List   Diagnosis Date Noted  . Nausea vomiting and diarrhea 08/29/2014  . Intractable vomiting 08/29/2014  . Diarrhea 08/29/2014  . Intussusception (HCC) 01/01/2012  . Acute back pain 12/28/2011  . Abdominal pain 12/28/2011  . Transaminitis 12/28/2011  . Asthma 12/28/2011  . IBS (irritable bowel syndrome) 12/28/2011  . Tobacco abuse 12/28/2011    Past Surgical History:  Procedure Laterality Date  . BOWEL RESECTION  04/2006   "upper bowel"  . OVARY SURGERY  ~ 2002   "had 10 inches blood built up; they opened me up and got the blood out"    OB History    No data available       Home Medications    Prior to Admission medications   Medication Sig Start Date End Date Taking? Authorizing Provider  cephALEXin (KEFLEX) 500 MG capsule Take 1 capsule (500 mg total) by mouth 2 (two) times daily. 11/25/16   Roxy Horsemanobert Browning, PA-C  dicyclomine (BENTYL) 10 MG capsule Take 10 mg by mouth 3 (three) times daily as needed for spasms. 04/27/16 04/27/17  Historical Provider, MD  etonogestrel (IMPLANON) 68 MG IMPL implant 1 each by Subdermal route once.    Historical Provider, MD  gabapentin (NEURONTIN) 300 MG capsule Take 300 mg by mouth 3 (three) times daily.  Historical Provider, MD  hydroxypropyl methylcellulose / hypromellose (ISOPTO TEARS / GONIOVISC) 2.5 % ophthalmic solution Place 1 drop into the left eye 4 (four) times daily. 09/10/16   Lavera Guise, MD  hydrOXYzine (VISTARIL) 50 MG capsule Take 50 mg by mouth 3 (three) times daily as needed for itching.    Historical Provider, MD  ibuprofen (ADVIL,MOTRIN) 400 MG tablet Take 1 tablet (400 mg total) by mouth every 6 (six) hours as needed for moderate pain. 04/13/16   Shaune Pollack, MD  ibuprofen (ADVIL,MOTRIN) 800 MG tablet Take 1 tablet (800 mg total) by mouth 3 (three) times daily. 11/17/16   Arby Barrette, MD  pantoprazole (PROTONIX) 40 MG tablet Take 40 mg by  mouth daily.    Historical Provider, MD  QUEtiapine (SEROQUEL) 50 MG tablet Take 50 mg by mouth at bedtime.    Historical Provider, MD  sertraline (ZOLOFT) 100 MG tablet Take 100 mg by mouth daily.    Historical Provider, MD    Family History Family History  Problem Relation Age of Onset  . Breast cancer Maternal Grandmother   . Colon cancer Maternal Grandmother   . Irritable bowel syndrome Maternal Grandmother   . Diabetes Mother   . Heart disease Father   . Cancer Maternal Aunt     colon cancer   . Vision loss Maternal Grandfather     Social History Social History  Substance Use Topics  . Smoking status: Current Every Day Smoker    Packs/day: 1.00    Years: 16.00    Types: Cigarettes  . Smokeless tobacco: Never Used     Comment: tobacco handout given 01/31/2012  . Alcohol use No     Allergies   Cinnamon; Ciprofloxacin; Levaquin [levofloxacin]; Morphine and related; Penicillins; Toradol [ketorolac tromethamine]; Ultram [tramadol]; Acetaminophen; and Sulfa antibiotics   Review of Systems Review of Systems  Constitutional: Positive for chills and fever.  Gastrointestinal: Positive for abdominal pain and nausea. Negative for constipation, diarrhea and vomiting.  Genitourinary: Positive for vaginal bleeding.    A complete 10 system review of systems was obtained and all systems are negative except as noted in the HPI and PMH.   Physical Exam Updated Vital Signs BP 134/85 (BP Location: Right Arm)   Pulse 81   Temp 98.1 F (36.7 C) (Oral)   Resp 18   Ht 4\' 11"  (1.499 m)   Wt 155 lb 7 oz (70.5 kg)   SpO2 100%   BMI 31.39 kg/m   Physical Exam  Constitutional: She appears well-developed and well-nourished. No distress.  HENT:  Head: Normocephalic and atraumatic.  Eyes: Conjunctivae are normal.  Neck: Neck supple.  Cardiovascular: Normal rate, regular rhythm and normal heart sounds.   No murmur heard. Pulmonary/Chest: Effort normal and breath sounds normal. No  respiratory distress.  Abdominal: Soft. There is tenderness in the left upper quadrant. There is no CVA tenderness.  Diffuse lower abdominal pain upon palpation with no guarding.  Mild LUQ tenderness. RUQ and epigastrium non-tender.   Musculoskeletal: She exhibits no edema.       Right lower leg: She exhibits no tenderness.       Left lower leg: She exhibits no tenderness.  No calf tenderness, no peripheral edema.   Neurological: She is alert.  Skin: Skin is warm and dry.  Psychiatric: She has a normal mood and affect.  Nursing note and vitals reviewed.    ED Treatments / Results   DIAGNOSTIC STUDIES: Oxygen Saturation is 97% on RA, adequate  by my interpretation.   COORDINATION OF CARE: 11:22 AM-Discussed next steps with pt. Pt verbalized understanding and is agreeable with the plan.   Labs (all labs ordered are listed, but only abnormal results are displayed) Labs Reviewed  URINE CULTURE - Abnormal; Notable for the following:       Result Value   Culture >=100,000 COLONIES/mL ESCHERICHIA COLI (*)    Organism ID, Bacteria ESCHERICHIA COLI (*)    All other components within normal limits  WET PREP, GENITAL - Abnormal; Notable for the following:    Clue Cells Wet Prep HPF POC PRESENT (*)    WBC, Wet Prep HPF POC MODERATE (*)    All other components within normal limits  URINALYSIS, ROUTINE W REFLEX MICROSCOPIC - Abnormal; Notable for the following:    Color, Urine AMBER (*)    APPearance CLOUDY (*)    Nitrite POSITIVE (*)    Leukocytes, UA TRACE (*)    All other components within normal limits  URINALYSIS, MICROSCOPIC (REFLEX) - Abnormal; Notable for the following:    Bacteria, UA MANY (*)    Squamous Epithelial / LPF 6-30 (*)    All other components within normal limits  PREGNANCY, URINE  GC/CHLAMYDIA PROBE AMP () NOT AT [facility]    EKG  EKG Interpretation None       Radiology No results found.  Procedures Procedures (including critical care  time)  Medications Ordered in ED Medications  ibuprofen (ADVIL,MOTRIN) tablet 800 mg (800 mg Oral Given 11/17/16 1903)     Initial Impression / Assessment and Plan / ED Course  I have reviewed the triage vital signs and the nursing notes.  Pertinent labs & imaging results that were available during my care of the patient were reviewed by me and considered in my medical decision making (see chart for details).       Final Clinical Impressions(s) / ED Diagnoses   Final diagnoses:  Pelvic pain in female   Patient has diffuse abdominal pain. At this time no sign of surgical abdomen. Pelvic examination does not suggest cervicitis or PID. I will not institute empiric treatment. Pelvic cultures and urine cultures will be pending. At this time no clear signs of cystitis and UA with 0-5 wbc's. Patient prescribed ibuprofen for pelvic pain. New Prescriptions Discharge Medication List as of 11/17/2016  8:26 PM    START taking these medications   Details  !! ibuprofen (ADVIL,MOTRIN) 800 MG tablet Take 1 tablet (800 mg total) by mouth 3 (three) times daily., Starting Wed 11/17/2016, Print     !! - Potential duplicate medications found. Please discuss with provider.         Arby Barrette, MD 12/01/16 1135

## 2016-11-17 NOTE — ED Triage Notes (Signed)
c/o LLQ pain and dysuria x 3 days-NAD-steady gait

## 2016-11-18 LAB — GC/CHLAMYDIA PROBE AMP (~~LOC~~) NOT AT ARMC
Chlamydia: NEGATIVE
Neisseria Gonorrhea: NEGATIVE

## 2016-11-20 LAB — URINE CULTURE: Culture: >=100000 — AB

## 2016-11-21 ENCOUNTER — Telehealth: Payer: Self-pay

## 2016-11-21 NOTE — Telephone Encounter (Signed)
Post ED Visit - Positive Culture Follow-up: Unsuccessful Patient Follow-up  Culture assessed and recommendations reviewed by:   Enzo Bi, Pharm.D.  Celedonio Miyamoto, Pharm.D., BCPS AQ-ID  Garvin Fila, Pharm.D., BCPS  Georgina Pillion, Pharm.D., BCPS  [place], [address]D., BCPS, AAHIVP  Estella Husk, Pharm.D., BCPS, AAHIVP  Lysle Pearl, PharmD, BCPS  Casilda Carls, PharmD, BCPS  Pollyann Samples, PharmD, BCPS [place] [facility] Pharm D Positive urine culture   Patient discharged without antimicrobial prescription and treatment is now indicated  Organism is resistant to prescribed ED discharge antimicrobial  Patient with positive blood cultures   Unable to contact patient after 3 attempts, letter will be sent to address on file  Jerry Caras 11/21/2016, 11:56 AM

## 2016-11-25 ENCOUNTER — Encounter (HOSPITAL_BASED_OUTPATIENT_CLINIC_OR_DEPARTMENT_OTHER): Payer: Self-pay | Admitting: Emergency Medicine

## 2016-11-25 ENCOUNTER — Emergency Department (HOSPITAL_BASED_OUTPATIENT_CLINIC_OR_DEPARTMENT_OTHER)
Admission: EM | Admit: 2016-11-25 | Discharge: 2016-11-25 | Disposition: A | Discharge status: Home / Self Care | Payer: Medicaid Other | Attending: Emergency Medicine | Admitting: Emergency Medicine

## 2016-11-25 DIAGNOSIS — N3000 Acute cystitis without hematuria: Secondary | ICD-10-CM | POA: Insufficient documentation

## 2016-11-25 DIAGNOSIS — F17210 Nicotine dependence, cigarettes, uncomplicated: Secondary | ICD-10-CM | POA: Insufficient documentation

## 2016-11-25 DIAGNOSIS — J45909 Unspecified asthma, uncomplicated: Secondary | ICD-10-CM | POA: Insufficient documentation

## 2016-11-25 HISTORY — DX: Other psychoactive substance use, unspecified, uncomplicated: F19.90

## 2016-11-25 HISTORY — DX: Opioid abuse, uncomplicated: F11.10

## 2016-11-25 LAB — CBC WITH DIFFERENTIAL/PLATELET
BASOS PCT: 0 %
Basophils Absolute: 0 10*3/uL (ref 0.0–0.1)
Eosinophils Absolute: 0.1 10*3/uL (ref 0.0–0.7)
Eosinophils Relative: 1 %
HEMATOCRIT: 40.8 % (ref 36.0–46.0)
Hemoglobin: 14.2 g/dL (ref 12.0–15.0)
Lymphocytes Relative: 34 %
Lymphs Abs: 2.3 10*3/uL (ref 0.7–4.0)
MCH: 29.8 pg (ref 26.0–34.0)
MCHC: 34.8 g/dL (ref 30.0–36.0)
MCV: 85.7 fL (ref 78.0–100.0)
MONO ABS: 0.6 10*3/uL (ref 0.1–1.0)
MONOS PCT: 9 %
NEUTROS ABS: 3.7 10*3/uL (ref 1.7–7.7)
Neutrophils Relative %: 56 %
Platelets: 290 10*3/uL (ref 150–400)
RBC: 4.76 MIL/uL (ref 3.87–5.11)
RDW: 12.7 % (ref 11.5–15.5)
WBC: 6.7 10*3/uL (ref 4.0–10.5)

## 2016-11-25 LAB — BASIC METABOLIC PANEL
ANION GAP: 7 (ref 5–15)
BUN: 14 mg/dL (ref 6–20)
CO2: 24 mmol/L (ref 22–32)
Calcium: 9.3 mg/dL (ref 8.9–10.3)
Chloride: 102 mmol/L (ref 101–111)
Creatinine, Ser: 0.79 mg/dL (ref 0.44–1.00)
GFR calc Af Amer: >60 mL/min (ref >60)
GFR calc non Af Amer: >60 mL/min (ref >60)
GLUCOSE: 102 mg/dL — AB (ref 65–99)
Potassium: 3.2 mmol/L — ABNORMAL LOW (ref 3.5–5.1)
Sodium: 133 mmol/L — ABNORMAL LOW (ref 135–145)

## 2016-11-25 LAB — URINALYSIS, ROUTINE W REFLEX MICROSCOPIC
Glucose, UA: NEGATIVE mg/dL
Ketones, ur: NEGATIVE mg/dL
NITRITE: NEGATIVE
PROTEIN: NEGATIVE mg/dL
Specific Gravity, Urine: 1.021 (ref 1.005–1.030)
pH: 6 (ref 5.0–8.0)

## 2016-11-25 LAB — URINALYSIS, MICROSCOPIC (REFLEX)

## 2016-11-25 LAB — PREGNANCY, URINE: PREG TEST UR: NEGATIVE

## 2016-11-25 MED ORDER — DEXTROSE 5 % IV SOLN
1.0000 g | Freq: Once | INTRAVENOUS | Status: AC
  Administered 2016-11-25: 1 g via INTRAVENOUS
  Filled 2016-11-25: qty 10

## 2016-11-25 MED ORDER — CEPHALEXIN 500 MG PO CAPS: 500.0000 mg | ORAL_CAPSULE | Freq: Two times a day (BID) | ORAL | 0 refills | Status: DC

## 2016-11-25 MED ORDER — SODIUM CHLORIDE 0.9 % IV BOLUS (SEPSIS)
1000.0000 mL | Freq: Once | INTRAVENOUS | Status: AC
Start: 2016-11-25 — End: 2016-11-25
  Administered 2016-11-25: 1000 mL via INTRAVENOUS

## 2016-11-25 MED ORDER — ONDANSETRON HCL 4 MG/2ML IJ SOLN
4.0000 mg | Freq: Once | INTRAMUSCULAR | Status: AC
  Administered 2016-11-25: 4 mg via INTRAVENOUS
  Filled 2016-11-25: qty 2

## 2016-11-25 MED ORDER — HYDROMORPHONE HCL 1 MG/ML IJ SOLN
0.5000 mg | Freq: Once | INTRAMUSCULAR | Status: AC
  Administered 2016-11-25: 0.5 mg via INTRAVENOUS
  Filled 2016-11-25: qty 1

## 2016-11-25 NOTE — ED Triage Notes (Signed)
Pt is an IV drug user. Pt states last use was yesterday.

## 2016-11-25 NOTE — ED Provider Notes (Signed)
[facility] Provider Note   CSN: [id number] Arrival date & time: 11/25/16  1903   By signing my name below, I, Amy Walls, attest that this documentation has been prepared under the direction and in the presence of Roxy Horseman, PA-C. Electronically Signed: Freida Walls, Scribe. 11/25/2016. 9:06 PM.  History   Chief Complaint Chief Complaint  Patient presents with  . Urinary Tract Infection    The history is provided by the patient. No language interpreter was used.    HPI Comments:  Amy Walls is a 36 y.o. female who presents to the Emergency Department complaining of moderate lower back and abdominal pain x 1.5 weeks. She notes pain specifically with urination. Pt reports associated nausea, vomiting, malodorous urine, subjective fever and HA. Pt was seen in the ED for same on 3/28/218; states she was discharged with antibiotics. No alleviating factors noted.   Past Medical History:  Diagnosis Date  . ADHD (attention deficit hyperactivity disorder)   . Anxiety   . Arthritis    "inflammation of all my joints"  . Asthma   . Bowel obstruction 2007   "upper bowel"  . Chronic bronchitis    "yearly"  . Chronic mid back pain   . Complication of anesthesia 2007   "lost me on the table when I had upper bowel obstruction"  . Constipation, chronic   . Depression   . Fibromyalgia   . Hepatitis C   . Heroin abuse   . Hypoglycemia    "my sugar runs low alot"  . IV drug user   . Kidney infection    "often"  . Migraines 12/28/11   "@ least once/wk"  . PONV (postoperative nausea and vomiting)   . Recurrent UTI     Patient Active Problem List   Diagnosis Date Noted  . Nausea vomiting and diarrhea 08/29/2014  . Intractable vomiting 08/29/2014  . Diarrhea 08/29/2014  . Intussusception (HCC) 01/01/2012  . Acute back pain 12/28/2011  . Abdominal pain 12/28/2011  . Transaminitis 12/28/2011  . Asthma 12/28/2011  . IBS (irritable bowel syndrome) 12/28/2011  .  Tobacco abuse 12/28/2011    Past Surgical History:  Procedure Laterality Date  . BOWEL RESECTION  04/2006   "upper bowel"  . OVARY SURGERY  ~ 2002   "had 10 inches blood built up; they opened me up and got the blood out"    OB History    No data available       Home Medications    Prior to Admission medications   Medication Sig Start Date End Date Taking? Authorizing Provider  dicyclomine (BENTYL) 10 MG capsule Take 10 mg by mouth 3 (three) times daily as needed for spasms. 04/27/16 04/27/17  Historical Provider, MD  etonogestrel (IMPLANON) 68 MG IMPL implant 1 each by Subdermal route once.    Historical Provider, MD  gabapentin (NEURONTIN) 300 MG capsule Take 300 mg by mouth 3 (three) times daily.    Historical Provider, MD  hydroxypropyl methylcellulose / hypromellose (ISOPTO TEARS / GONIOVISC) 2.5 % ophthalmic solution Place 1 drop into the left eye 4 (four) times daily. 09/10/16   Lavera Guise, MD  hydrOXYzine (VISTARIL) 50 MG capsule Take 50 mg by mouth 3 (three) times daily as needed for itching.    Historical Provider, MD  ibuprofen (ADVIL,MOTRIN) 400 MG tablet Take 1 tablet (400 mg total) by mouth every 6 (six) hours as needed for moderate pain. 04/13/16   Shaune Pollack, MD  ibuprofen (ADVIL,MOTRIN) 800 MG  tablet Take 1 tablet (800 mg total) by mouth 3 (three) times daily. 11/17/16   Arby Barrette, MD  pantoprazole (PROTONIX) 40 MG tablet Take 40 mg by mouth daily.    Historical Provider, MD  QUEtiapine (SEROQUEL) 50 MG tablet Take 50 mg by mouth at bedtime.    Historical Provider, MD  sertraline (ZOLOFT) 100 MG tablet Take 100 mg by mouth daily.    Historical Provider, MD    Family History Family History  Problem Relation Age of Onset  . Breast cancer Maternal Grandmother   . Colon cancer Maternal Grandmother   . Irritable bowel syndrome Maternal Grandmother   . Diabetes Mother   . Heart disease Father   . Cancer Maternal Aunt     colon cancer   . Vision loss Maternal  Grandfather     Social History Social History  Substance Use Topics  . Smoking status: Current Every Day Smoker    Packs/day: 1.00    Years: 16.00    Types: Cigarettes  . Smokeless tobacco: Never Used     Comment: tobacco handout given 01/31/2012  . Alcohol use No     Allergies   Cinnamon; Ciprofloxacin; Levaquin [levofloxacin]; Morphine and related; Penicillins; Toradol [ketorolac tromethamine]; Ultram [tramadol]; Acetaminophen; and Sulfa antibiotics   Review of Systems Review of Systems  Constitutional: Positive for fever.  Gastrointestinal: Positive for abdominal pain, nausea and vomiting.  Genitourinary: Positive for dysuria.  Musculoskeletal: Positive for back pain.  Neurological: Positive for headaches.  All other systems reviewed and are negative.    Physical Exam Updated Vital Signs BP 116/75 (BP Location: Left Arm)   Pulse 71   Temp 98.4 F (36.9 C) (Oral)   Resp 14   Ht  (1.499 m)   Wt 155 lb (70.3 kg)   SpO2 95%   BMI 31.31 kg/m   Physical Exam  Constitutional: She is oriented to person, place, and time. She appears well-developed and well-nourished. No distress.  HENT:  Head: Normocephalic and atraumatic.  Eyes: EOM are normal.  Neck: Normal range of motion.  Cardiovascular: Normal rate, regular rhythm and normal heart sounds.   Pulmonary/Chest: Effort normal and breath sounds normal.  Abdominal: Soft. She exhibits no distension. There is no tenderness.  Musculoskeletal: Normal range of motion.  Neurological: She is alert and oriented to person, place, and time.  Skin: Skin is warm and dry.  Psychiatric: She has a normal mood and affect. Judgment normal.  Nursing note and vitals reviewed.    ED Treatments / Results  DIAGNOSTIC STUDIES:  Oxygen Saturation is 95% on RA, adequate by my interpretation.    COORDINATION OF CARE:  9:06 PM Discussed treatment plan with pt at bedside and pt agreed to plan.  Labs (all labs ordered are  listed, but only abnormal results are displayed) Labs Reviewed  URINALYSIS, ROUTINE W REFLEX MICROSCOPIC - Abnormal; Notable for the following:       Result Value   Color, Urine AMBER (*)    APPearance TURBID (*)    Hgb urine dipstick MODERATE (*)    Bilirubin Urine SMALL (*)    Leukocytes, UA SMALL (*)    All other components within normal limits  URINALYSIS, MICROSCOPIC (REFLEX) - Abnormal; Notable for the following:    Bacteria, UA MANY (*)    Squamous Epithelial / LPF 0-5 (*)    All other components within normal limits  BASIC METABOLIC PANEL - Abnormal; Notable for the following:    Sodium 133 (*)  Potassium 3.2 (*)    Glucose, Bld 102 (*)    All other components within normal limits  PREGNANCY, URINE  CBC WITH DIFFERENTIAL/PLATELET    EKG  EKG Interpretation None       Radiology No results found.  Procedures Procedures (including critical care time)  Medications Ordered in ED Medications  cefTRIAXone (ROCEPHIN) 1 g in dextrose 5 % 50 mL IVPB (0 g Intravenous Stopped 11/25/16 2249)  sodium chloride 0.9 % bolus 1,000 mL (1,000 mLs Intravenous New Bag/Given 11/25/16 2150)  ondansetron (ZOFRAN) injection 4 mg (4 mg Intravenous Given 11/25/16 2149)  HYDROmorphone (DILAUDID) injection 0.5 mg (0.5 mg Intravenous Given 11/25/16 2149)     Initial Impression / Assessment and Plan / ED Course  I have reviewed the triage vital signs and the nursing notes.  Pertinent labs & imaging results that were available during my care of the patient were reviewed by me and considered in my medical decision making (see chart for details).     Patient with lower abdominal pain and dysuria x 1 week.  She reports subjective fevers and intermittent nausea/vomiting.  No vomiting in ED.  VSS.  Urine culture reviewed.  Sensitive to ceftriaxone, which patient can take and has taken in the past.  Will give some fluids and rocephin in the ED.  DC to home.  Final Clinical Impressions(s) / ED  Diagnoses   Final diagnoses:  Acute cystitis without hematuria    New Prescriptions New Prescriptions   CEPHALEXIN (KEFLEX) 500 MG CAPSULE    Take 1 capsule (500 mg total) by mouth 2 (two) times daily.   I personally performed the services described in this documentation, which was scribed in my presence. The recorded information has been reviewed and is accurate.       Roxy Horseman, PA-C 11/25/16 2316    Loren Racer, MD 11/27/16 [phone]

## 2016-11-25 NOTE — ED Triage Notes (Signed)
Pt c/o lower back pain, lower abd pain, foul smelling urine, vomiting and fever x 1 week. Seen here for same last week.

## 2016-11-25 NOTE — ED Notes (Signed)
In to assess vitals. Pt sleeping.

## 2016-11-25 NOTE — ED Notes (Signed)
Pt called to treatment room with no answer from lobby.  

## 2016-11-25 NOTE — ED Notes (Signed)
EMT came in room to get vitals on pt. Pt is up and going through drawers in room. RN notified.

## 2016-11-29 ENCOUNTER — Telehealth: Payer: Self-pay | Admitting: *Deleted

## 2016-11-29 NOTE — Telephone Encounter (Signed)
Post ED Visit - Positive Culture Follow-up: Successful Patient Follow-Up  Culture assessed and recommendations reviewed by:  Enzo Bi, Pharm.D.  Celedonio Miyamoto, Pharm.D., BCPS AQ-ID  Garvin Fila, Pharm.D., BCPS  Georgina Pillion, Pharm.D., BCPS  [place], [address].D., BCPS, AAHIVP  Estella Husk, Pharm.D., BCPS, AAHIVP  Lysle Pearl, PharmD, BCPS  Casilda Carls, PharmD, BCPS  Pollyann Samples, PharmD, BCPS  Positive  urineculture   Patient discharged without antimicrobial prescription and treatment is now indicated  Organism is resistant to prescribed ED discharge antimicrobial  Patient with positive blood cultures  Changes discussed with ED provider:Kelly Jobe Gibbon, PA New antibiotic prescription Macrobid 100 mg PO BID x 5 days Called to [facility] [place], [place] [phone]  Contacted patient, date 11/29/2016, time 1548   Lysle Pearl 11/29/2016, 3:46 PM

## 2016-12-02 ENCOUNTER — Emergency Department (HOSPITAL_BASED_OUTPATIENT_CLINIC_OR_DEPARTMENT_OTHER)
Admission: EM | Admit: 2016-12-02 | Discharge: 2016-12-02 | Disposition: A | Discharge status: Home / Self Care | Payer: Medicaid Other | Attending: Emergency Medicine | Admitting: Emergency Medicine

## 2016-12-02 ENCOUNTER — Encounter (HOSPITAL_BASED_OUTPATIENT_CLINIC_OR_DEPARTMENT_OTHER): Payer: Self-pay | Admitting: Emergency Medicine

## 2016-12-02 DIAGNOSIS — F17210 Nicotine dependence, cigarettes, uncomplicated: Secondary | ICD-10-CM | POA: Insufficient documentation

## 2016-12-02 DIAGNOSIS — Z79899 Other long term (current) drug therapy: Secondary | ICD-10-CM | POA: Insufficient documentation

## 2016-12-02 DIAGNOSIS — Z791 Long term (current) use of non-steroidal anti-inflammatories (NSAID): Secondary | ICD-10-CM | POA: Insufficient documentation

## 2016-12-02 DIAGNOSIS — F909 Attention-deficit hyperactivity disorder, unspecified type: Secondary | ICD-10-CM | POA: Insufficient documentation

## 2016-12-02 DIAGNOSIS — R112 Nausea with vomiting, unspecified: Secondary | ICD-10-CM | POA: Insufficient documentation

## 2016-12-02 DIAGNOSIS — J45909 Unspecified asthma, uncomplicated: Secondary | ICD-10-CM | POA: Insufficient documentation

## 2016-12-02 DIAGNOSIS — R197 Diarrhea, unspecified: Secondary | ICD-10-CM | POA: Insufficient documentation

## 2016-12-02 DIAGNOSIS — N3001 Acute cystitis with hematuria: Secondary | ICD-10-CM | POA: Insufficient documentation

## 2016-12-02 LAB — BASIC METABOLIC PANEL
ANION GAP: 5 (ref 5–15)
BUN: 10 mg/dL (ref 6–20)
CALCIUM: 8.8 mg/dL — AB (ref 8.9–10.3)
CO2: 25 mmol/L (ref 22–32)
CREATININE: 0.64 mg/dL (ref 0.44–1.00)
Chloride: 106 mmol/L (ref 101–111)
Glucose, Bld: 104 mg/dL — ABNORMAL HIGH (ref 65–99)
Potassium: 3.6 mmol/L (ref 3.5–5.1)
SODIUM: 136 mmol/L (ref 135–145)

## 2016-12-02 LAB — CBC WITH DIFFERENTIAL/PLATELET
BASOS ABS: 0 10*3/uL (ref 0.0–0.1)
BASOS PCT: 0 %
EOS ABS: 0.2 10*3/uL (ref 0.0–0.7)
EOS PCT: 3 %
HEMATOCRIT: 38.1 % (ref 36.0–46.0)
Hemoglobin: 13.1 g/dL (ref 12.0–15.0)
Lymphocytes Relative: 43 %
Lymphs Abs: 3.1 10*3/uL (ref 0.7–4.0)
MCH: 30.1 pg (ref 26.0–34.0)
MCHC: 34.4 g/dL (ref 30.0–36.0)
MCV: 87.6 fL (ref 78.0–100.0)
Monocytes Absolute: 0.7 10*3/uL (ref 0.1–1.0)
Monocytes Relative: 9 %
Neutro Abs: 3.2 10*3/uL (ref 1.7–7.7)
Neutrophils Relative %: 45 %
PLATELETS: 264 10*3/uL (ref 150–400)
RBC: 4.35 MIL/uL (ref 3.87–5.11)
RDW: 12.8 % (ref 11.5–15.5)
WBC: 7.2 10*3/uL (ref 4.0–10.5)

## 2016-12-02 MED ORDER — ONDANSETRON HCL 4 MG/2ML IJ SOLN
4.0000 mg | Freq: Once | INTRAMUSCULAR | Status: AC
Start: 2016-12-02 — End: 2016-12-02
  Administered 2016-12-02: 4 mg via INTRAVENOUS
  Filled 2016-12-02: qty 2

## 2016-12-02 MED ORDER — SODIUM CHLORIDE 0.9 % IV BOLUS (SEPSIS)
1000.0000 mL | Freq: Once | INTRAVENOUS | Status: AC
  Administered 2016-12-02: 1000 mL via INTRAVENOUS

## 2016-12-02 MED ORDER — DEXTROSE 5 % IV SOLN
1.0000 g | Freq: Once | INTRAVENOUS | Status: AC
  Administered 2016-12-02: 1 g via INTRAVENOUS
  Filled 2016-12-02: qty 10

## 2016-12-02 MED ORDER — METOCLOPRAMIDE HCL 5 MG/5ML PO SOLN: 10.0000 mg | Freq: Three times a day (TID) | ORAL | 0 refills | Status: DC

## 2016-12-02 MED ORDER — CEPHALEXIN 500 MG PO CAPS: 500.0000 mg | ORAL_CAPSULE | Freq: Three times a day (TID) | ORAL | 0 refills | Status: AC

## 2016-12-02 MED ORDER — METOCLOPRAMIDE HCL 5 MG/ML IJ SOLN
10.0000 mg | Freq: Once | INTRAMUSCULAR | Status: AC
Start: 2016-12-02 — End: 2016-12-02
  Administered 2016-12-02: 10 mg via INTRAVENOUS
  Filled 2016-12-02: qty 2

## 2016-12-02 NOTE — ED Triage Notes (Signed)
Patient states that she continues to hurt in her stomach. Was called at home and tols that she had ecoli in her stool. The patient also reports that she was called in a medication to the pharmcy and she could not get it filled because she did not have any money. The patient reports that she needs treatment

## 2016-12-02 NOTE — ED Notes (Signed)
Called Pt 1x no answer in lobby, restrooms, outside entrance

## 2016-12-02 NOTE — ED Notes (Signed)
Pt sts e. coli was in urine, not stool. Confirms she is unable to afford abx rx.

## 2016-12-02 NOTE — ED Notes (Signed)
Pt requested Ginger Ale to drink; this was provided for po challenge.

## 2016-12-02 NOTE — ED Provider Notes (Signed)
[facility] Note   CSN: [id number] Arrival date & time: 12/02/16  1331     History   Chief Complaint Chief Complaint  Patient presents with  . Abdominal Pain    HPI Amy Walls is a 36 y.o. female.  The history is provided by the patient.  Abdominal Pain   This is a new problem. Episode onset: 2 weeks. The problem occurs constantly. Progression since onset: fluctuating. Associated with: known e.coli UTI but not able to get Abx due to financial constraints. The pain is located in the suprapubic region and periumbilical region. Associated symptoms include diarrhea, nausea and vomiting. Associated symptoms comments: Since March. Related to suspicious food intake.. Nothing aggravates the symptoms. Nothing relieves the symptoms.   He did endorse heroin and suboxone use, but does not believe these symptoms are not related to withdrawal as she has used recently and continues to have these symptoms.   Past Medical History:  Diagnosis Date  . ADHD (attention deficit hyperactivity disorder)   . Anxiety   . Arthritis    "inflammation of all my joints"  . Asthma   . Bowel obstruction 2007   "upper bowel"  . Chronic bronchitis    "yearly"  . Chronic mid back pain   . Complication of anesthesia 2007   "lost me on the table when I had upper bowel obstruction"  . Constipation, chronic   . Depression   . Fibromyalgia   . Hepatitis C   . Heroin abuse   . Hypoglycemia    "my sugar runs low alot"  . IV drug user   . Kidney infection    "often"  . Migraines 12/28/11   "@ least once/wk"  . PONV (postoperative nausea and vomiting)   . Recurrent UTI     Patient Active Problem List   Diagnosis Date Noted  . Nausea vomiting and diarrhea 08/29/2014  . Intractable vomiting 08/29/2014  . Diarrhea 08/29/2014  . Intussusception (HCC) 01/01/2012  . Acute back pain 12/28/2011  . Abdominal pain 12/28/2011  . Transaminitis 12/28/2011  . Asthma 12/28/2011  . IBS  (irritable bowel syndrome) 12/28/2011  . Tobacco abuse 12/28/2011    Past Surgical History:  Procedure Laterality Date  . BOWEL RESECTION  04/2006   "upper bowel"  . OVARY SURGERY  ~ 2002   "had 10 inches blood built up; they opened me up and got the blood out"    OB History    No data available       Home Medications    Prior to Admission medications   Medication Sig Start Date End Date Taking? Authorizing Provider  cephALEXin (KEFLEX) 500 MG capsule Take 1 capsule (500 mg total) by mouth 3 (three) times daily. 12/02/16 12/09/16  Nira Conn, MD  dicyclomine (BENTYL) 10 MG capsule Take 10 mg by mouth 3 (three) times daily as needed for spasms. 04/27/16 04/27/17  Historical Provider, MD  etonogestrel (IMPLANON) 68 MG IMPL implant 1 each by Subdermal route once.    Historical Provider, MD  gabapentin (NEURONTIN) 300 MG capsule Take 300 mg by mouth 3 (three) times daily.    Historical Provider, MD  hydroxypropyl methylcellulose / hypromellose (ISOPTO TEARS / GONIOVISC) 2.5 % ophthalmic solution Place 1 drop into the left eye 4 (four) times daily. 09/10/16   Lavera Guise, MD  hydrOXYzine (VISTARIL) 50 MG capsule Take 50 mg by mouth 3 (three) times daily as needed for itching.    Historical Provider, MD  ibuprofen (  ADVIL,MOTRIN) 400 MG tablet Take 1 tablet (400 mg total) by mouth every 6 (six) hours as needed for moderate pain. 04/13/16   Shaune Pollack, MD  ibuprofen (ADVIL,MOTRIN) 800 MG tablet Take 1 tablet (800 mg total) by mouth 3 (three) times daily. 11/17/16   Arby Barrette, MD  metoCLOPramide (REGLAN) 5 MG/5ML solution Take 10 mLs (10 mg total) by mouth 4 (four) times daily -  before meals and at bedtime. 12/02/16 12/07/16  Nira Conn, MD  pantoprazole (PROTONIX) 40 MG tablet Take 40 mg by mouth daily.    Historical Provider, MD  QUEtiapine (SEROQUEL) 50 MG tablet Take 50 mg by mouth at bedtime.    Historical Provider, MD  sertraline (ZOLOFT) 100 MG tablet Take 100 mg  by mouth daily.    Historical Provider, MD    Family History Family History  Problem Relation Age of Onset  . Breast cancer Maternal Grandmother   . Colon cancer Maternal Grandmother   . Irritable bowel syndrome Maternal Grandmother   . Diabetes Mother   . Heart disease Father   . Cancer Maternal Aunt     colon cancer   . Vision loss Maternal Grandfather     Social History Social History  Substance Use Topics  . Smoking status: Current Every Day Smoker    Packs/day: 1.00    Years: 16.00    Types: Cigarettes  . Smokeless tobacco: Never Used     Comment: tobacco handout given 01/31/2012  . Alcohol use No     Allergies   Cinnamon; Ciprofloxacin; Levaquin [levofloxacin]; Morphine and related; Penicillins; Toradol [ketorolac tromethamine]; Ultram [tramadol]; Acetaminophen; and Sulfa antibiotics   Review of Systems Review of Systems  Gastrointestinal: Positive for abdominal pain, diarrhea, nausea and vomiting.  All other systems are reviewed and are negative for acute change except as noted in the HPI    Physical Exam Updated Vital Signs BP 118/83 (BP Location: Right Arm)   Pulse 96   Temp 98.6 F (37 C) (Oral)   Resp 18   Ht 5' (1.524 m)   SpO2 98%   Physical Exam  Constitutional: She is oriented to person, place, and time. She appears well-developed and well-nourished. No distress.  HENT:  Head: Normocephalic and atraumatic.  Right Ear: External ear normal.  Left Ear: External ear normal.  Nose: Nose normal.  Eyes: Conjunctivae and EOM are normal. No scleral icterus.  Neck: Normal range of motion and phonation normal.  Cardiovascular: Normal rate and regular rhythm.   Pulmonary/Chest: Effort normal. No stridor. No respiratory distress.  Abdominal: She exhibits no distension. There is tenderness in the right lower quadrant, suprapubic area and left lower quadrant. There is no rigidity, no rebound and no guarding.  Musculoskeletal: Normal range of motion. She  exhibits no edema.  Neurological: She is alert and oriented to person, place, and time.  Skin: She is not diaphoretic.  Psychiatric: She has a normal mood and affect. Her behavior is normal.  Vitals reviewed.    ED Treatments / Results  Labs (all labs ordered are listed, but only abnormal results are displayed) Labs Reviewed  BASIC METABOLIC PANEL - Abnormal; Notable for the following:       Result Value   Glucose, Bld 104 (*)    Calcium 8.8 (*)    All other components within normal limits  CBC WITH DIFFERENTIAL/PLATELET    EKG  EKG Interpretation None       Radiology No results found.  Procedures Procedures (including critical  care time)  Medications Ordered in ED Medications  sodium chloride 0.9 % bolus 1,000 mL (0 mLs Intravenous Stopped 12/02/16 1708)  ondansetron (ZOFRAN) injection 4 mg (4 mg Intravenous Given 12/02/16 1613)  cefTRIAXone (ROCEPHIN) 1 g in dextrose 5 % 50 mL IVPB (0 g Intravenous Stopped 12/02/16 1708)  metoCLOPramide (REGLAN) injection 10 mg (10 mg Intravenous Given 12/02/16 1803)     Initial Impression / Assessment and Plan / ED Course  I have reviewed the triage vital signs and the nursing notes.  Pertinent labs & imaging results that were available during my care of the patient were reviewed by me and considered in my medical decision making (see chart for details).     36 y.o. female presents with vomiting, diarrhea, for 2 weeks. possible suspicious food intake. No historical evidence to suggest suspicious toxic ingestion or exposure. inadequate oral tolerance. Rest of history as above.  Patient appears well, not in distress, and with no signs of toxicity or dehydration. Abdomen nonperitonitic. Rest of the exam as above  Likely e.coli gastroenteritis with known e.coli UTI that has been untreated.   Labs grossly reassuring.    Doubt appendicitis, diverticulitis, severe colitis, dysentery.    Given IV Rocephin and antiemetics.  Able to  tolerate oral intake in the ED.   The patient is safe for discharge with strict return precautions.   Informed that she can get At [facility] for $4. Reglan coupon from for $4 at [facility] also provided to patient.  Final Clinical Impressions(s) / ED Diagnoses   Final diagnoses:  Nausea vomiting and diarrhea  Acute cystitis with hematuria   Disposition: Discharge  Condition: Good  I have discussed the results, Dx and Tx plan with the patient who expressed understanding and agree(s) with the plan. Discharge instructions discussed at great length. The patient was given strict return precautions who verbalized understanding of the instructions. No further questions at time of discharge.    New Prescriptions   CEPHALEXIN (KEFLEX) 500 MG CAPSULE    Take 1 capsule (500 mg total) by mouth 3 (three) times daily.   METOCLOPRAMIDE (REGLAN) 5 MG/5ML SOLUTION    Take 10 mLs (10 mg total) by mouth 4 (four) times daily -  before meals and at bedtime.    Follow Up: Dartha Lodge, FNP [address] [place] [address] [phone]  Schedule an appointment as soon as possible for a visit        Nira Conn, MD 12/02/16 1924

## 2016-12-02 NOTE — Discharge Instructions (Signed)
You can find Keflex at [facility] for $4. We have prescribed you metaclopramide for you nausea and vomiting. Attached is a coupon for this medicine which he can obtain at [facility] for $4 as well.

## 2017-03-19 IMAGING — MR MR BRAIN/IAC WO/W
13 of 17 series · 30 of 48 positions shown · IV contrast (multihance)
Comparison: CT head without contrast 09/10/2016. CTA of the head
and neck 09/10/2016.

CLINICAL DATA: New onset of facial droop and left-sided weakness.
Seen at [REDACTED] 4 days ago and diagnosed with Bell's
palsy.

EXAM:
MRI HEAD WITHOUT AND WITH CONTRAST
TECHNIQUE: Multiplanar, multiecho pulse sequences of the brain and surrounding
structures were obtained without and with intravenous contrast.
CONTRAST:  15mL MULTIHANCE GADOBENATE DIMEGLUMINE 529 MG/ML IV SOLN

[Series 3: T1 · sagittal · 5.0mm · 0.47mm/px · 1 of 21 slices shown (1 of 3)]
[im 1/21]
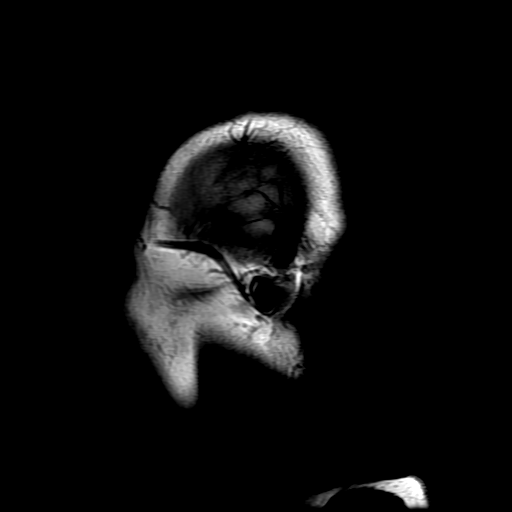

[Series 4: DWI · axial · 3.0mm · 1.09mm/px · z∈[-98,+38]mm · 7 of 96 slices shown (1 of 4)]
[im 1/96]
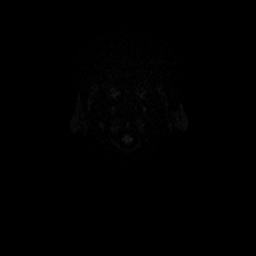
[im 16/96]
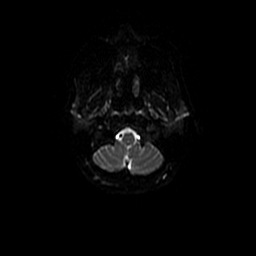
[im 32/96]
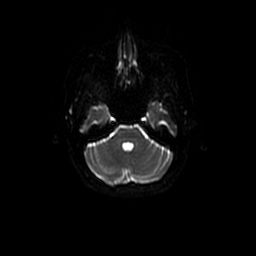
[im 48/96]
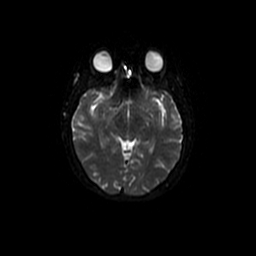
[im 64/96]
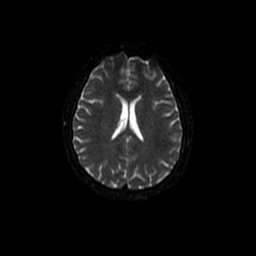
[im 80/96]
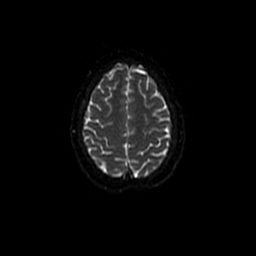
[im 96/96]
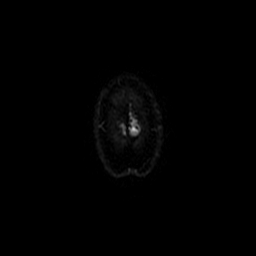

[Series 6: DWI · coronal · 5.0mm · 1.09mm/px · 5 of 64 slices shown (2 of 4)]
[im 1/64]
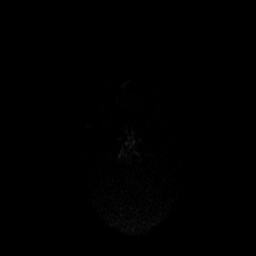
[im 16/64]
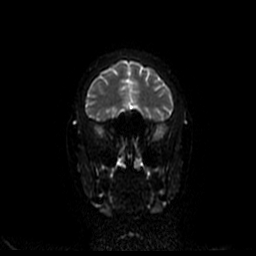
[im 32/64]
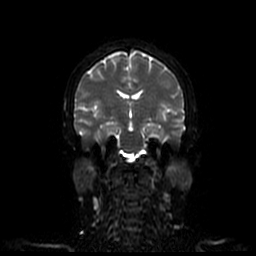
[im 48/64]
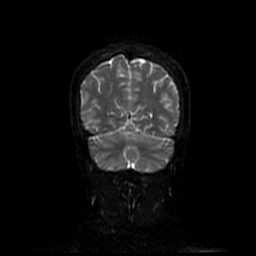
[im 64/64]
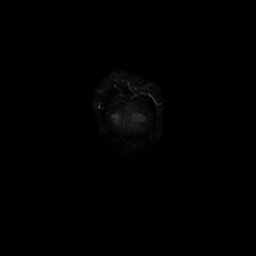

[Series 7: T2 · axial · 5.0mm · 0.43mm/px · z∈[-88,+46]mm · 2 of 24 slices shown (1 of 2)]
[im 1/24]
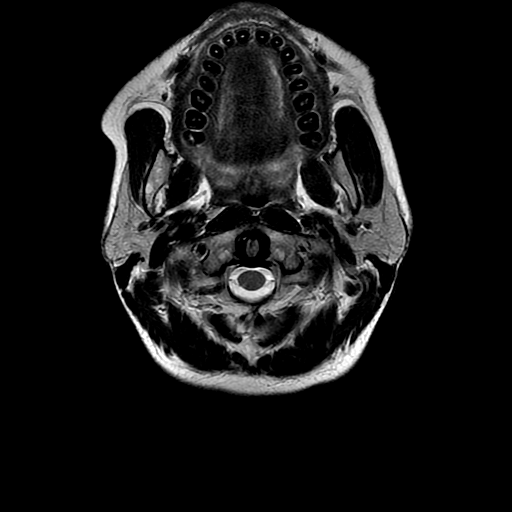
[im 24/24]
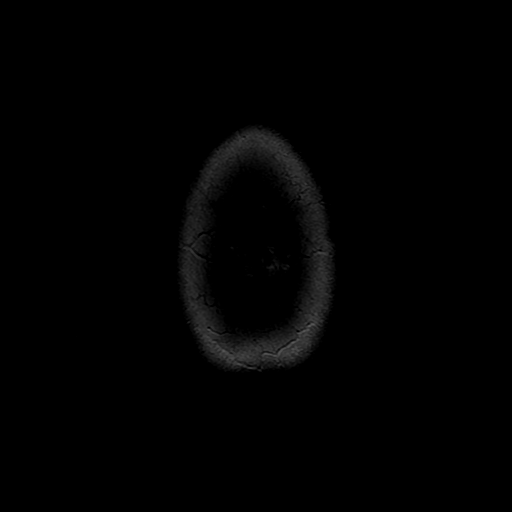

[Series 8: FLAIR · axial · 5.0mm · 0.43mm/px · z∈[-89,+47]mm · 2 of 21 slices shown]
[im 1/21]
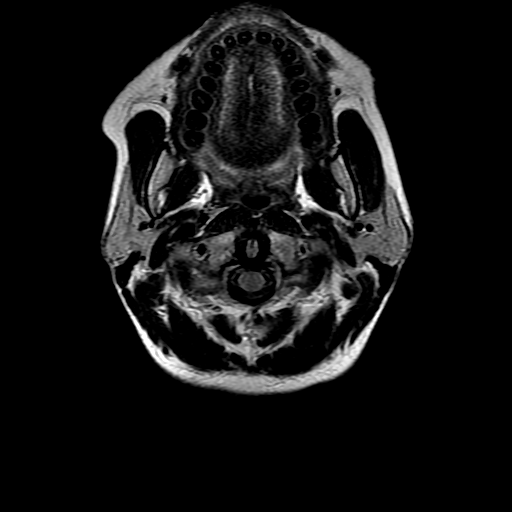
[im 21/21]
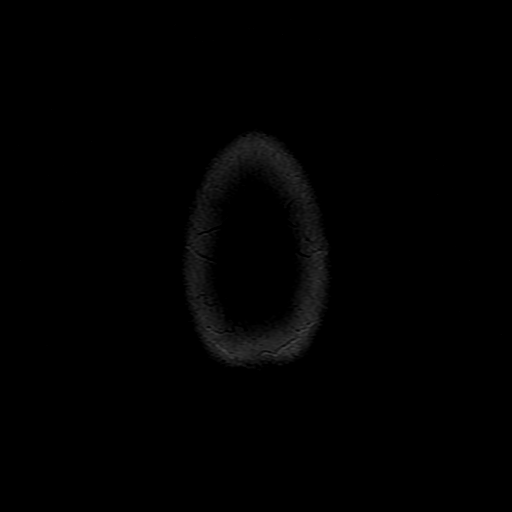

[Series 11: T2 · coronal · 5.0mm · 0.39mm/px · 2 of 25 slices shown (2 of 2)]
[im 1/25]
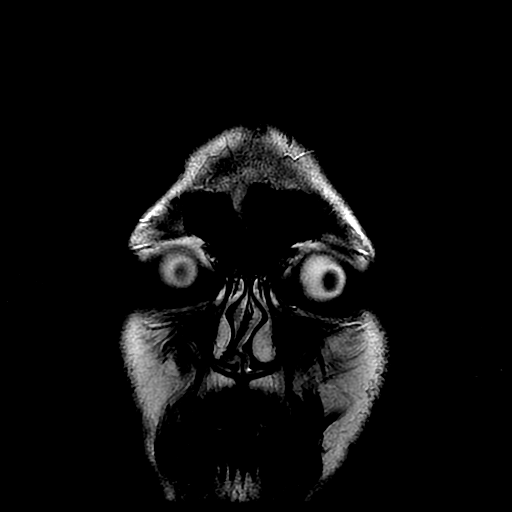
[im 25/25]
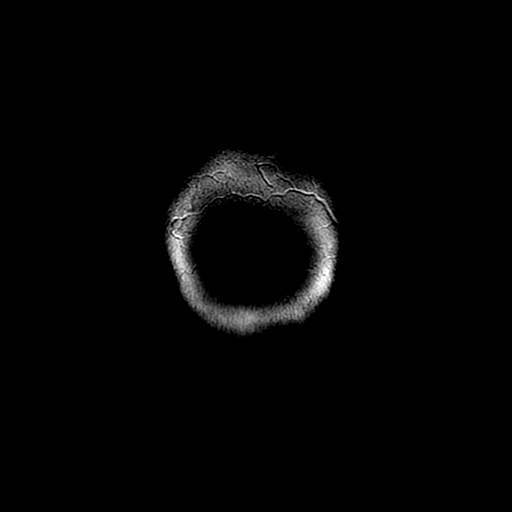

[Series 15: T1 · coronal · 3.0mm · 0.35mm/px · 1 of 14 slices shown (2 of 3)]
[im 1/14]
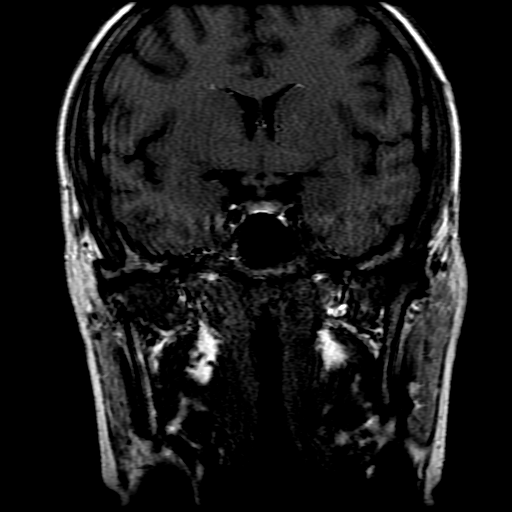

[Series 16: T1 · axial · 3.0mm · 0.35mm/px · 1 of 16 slices shown (3 of 3)]
[im 1/16]
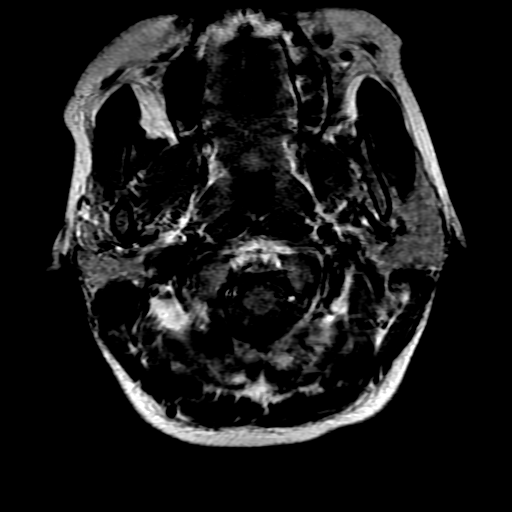

[Series 19: T1 post-contrast · axial · 3.0mm · 0.35mm/px · 1 of 16 slices shown (1 of 3)]
[im 1/16]
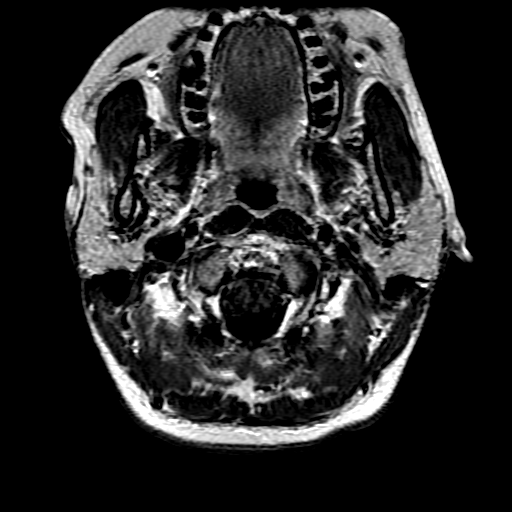

[Series 20: T1 post-contrast · coronal · 3.0mm · 0.35mm/px · 1 of 14 slices shown (2 of 3)]
[im 1/14]
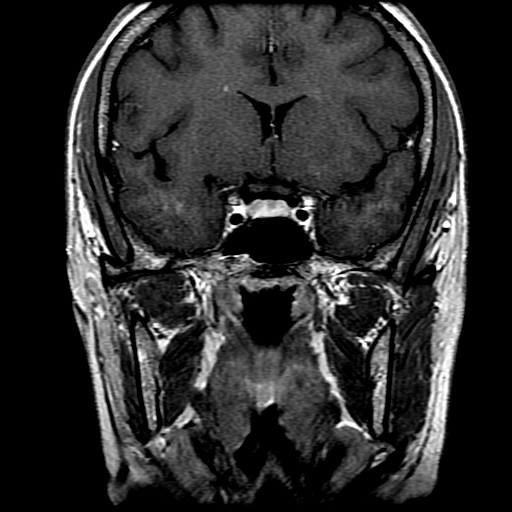

[Series 21: T1 post-contrast · coronal · 5.0mm · 0.39mm/px · 2 of 25 slices shown (3 of 3)]
[im 1/25]
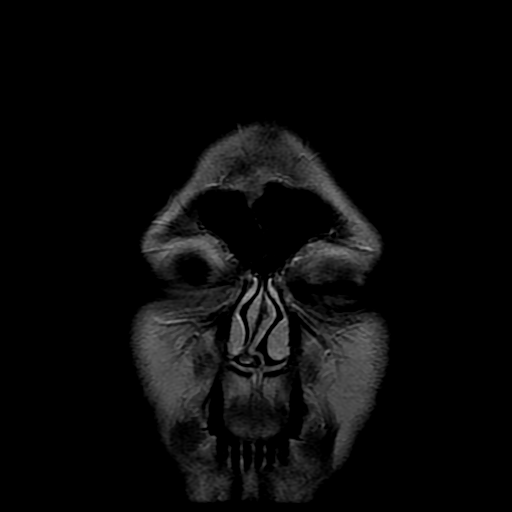
[im 25/25]
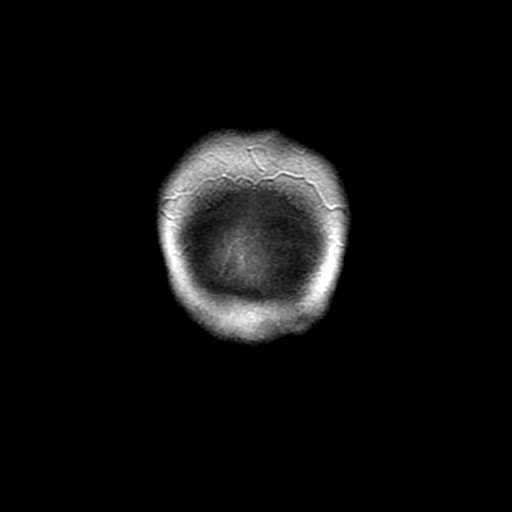

[Series 400: DWI · axial · 3.0mm · 1.09mm/px · z∈[-98,+38]mm · 3 of 48 slices shown (3 of 4)]
[im 1/48]
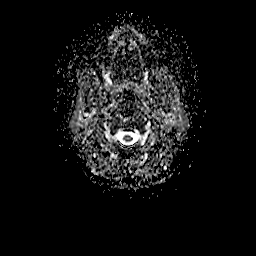
[im 24/48]
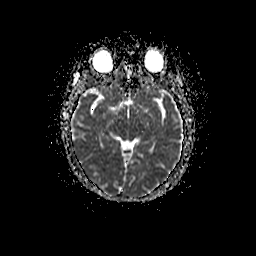
[im 48/48]
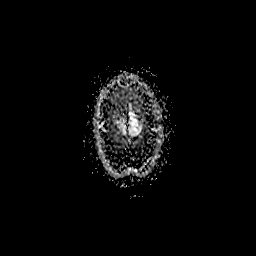

[Series 600: DWI · coronal · 5.0mm · 1.09mm/px · 2 of 32 slices shown (4 of 4)]
[im 1/32]
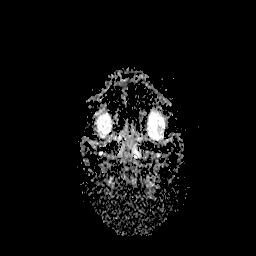
[im 32/32]
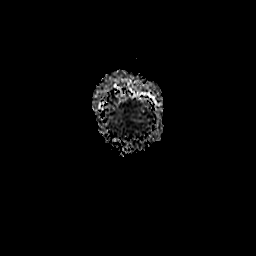

[30 of 48 positions shown; findings below may reference images not displayed]

FINDINGS: Brain: No acute infarct, hemorrhage, or mass lesion is present. The
ventricles are of normal size. No significant extraaxial fluid
collection is present. The brainstem and cerebellum are normal.

Postcontrast images demonstrate no pathologic enhancement.

Vascular: Flow is present in the major intracranial arteries.

Skull and upper cervical spine: The skullbase is normal. The
craniocervical junction is within normal limits. Midline sagittal
structures are unremarkable. A disc osteophyte complex at C4-5 is
asymmetric to the right.

Sinuses/Orbits: The paranasal sinuses are clear. Globes and orbits
are within normal limits.

Other: Dedicated imaging of the internal auditory canals
demonstrates a distinct appearance of the seventh and eighth cranial
nerves. The inner ear structures are normally formed.

Asymmetric enhancement is present within the left facial nerve
compatible with Bell's palsy.
IMPRESSION: 1. Asymmetric enhancement of the left facial nerve without focal
mass lesion. There is enhancement both within the canal and in the
descending segment. Findings are compatible with the clinical
diagnosis of Bell's palsy.
2. Normal MRI appearance of the brain.
3. No significant sinus disease.

## 2017-04-01 ENCOUNTER — Encounter (HOSPITAL_BASED_OUTPATIENT_CLINIC_OR_DEPARTMENT_OTHER): Payer: Self-pay | Admitting: *Deleted

## 2017-04-01 ENCOUNTER — Emergency Department (HOSPITAL_BASED_OUTPATIENT_CLINIC_OR_DEPARTMENT_OTHER)
Admission: EM | Admit: 2017-04-01 | Discharge: 2017-04-01 | Disposition: A | Discharge status: Home / Self Care | Payer: Medicaid Other | Attending: Emergency Medicine | Admitting: Emergency Medicine

## 2017-04-01 DIAGNOSIS — J45909 Unspecified asthma, uncomplicated: Secondary | ICD-10-CM | POA: Insufficient documentation

## 2017-04-01 DIAGNOSIS — R300 Dysuria: Secondary | ICD-10-CM | POA: Insufficient documentation

## 2017-04-01 DIAGNOSIS — J020 Streptococcal pharyngitis: Secondary | ICD-10-CM

## 2017-04-01 DIAGNOSIS — Z79899 Other long term (current) drug therapy: Secondary | ICD-10-CM | POA: Insufficient documentation

## 2017-04-01 DIAGNOSIS — N3000 Acute cystitis without hematuria: Secondary | ICD-10-CM | POA: Insufficient documentation

## 2017-04-01 DIAGNOSIS — F17210 Nicotine dependence, cigarettes, uncomplicated: Secondary | ICD-10-CM | POA: Insufficient documentation

## 2017-04-01 LAB — URINALYSIS, MICROSCOPIC (REFLEX)

## 2017-04-01 LAB — BASIC METABOLIC PANEL
Anion gap: 9 (ref 5–15)
BUN: 14 mg/dL (ref 6–20)
CHLORIDE: 99 mmol/L — AB (ref 101–111)
CO2: 26 mmol/L (ref 22–32)
CREATININE: 0.89 mg/dL (ref 0.44–1.00)
Calcium: 9 mg/dL (ref 8.9–10.3)
GFR calc Af Amer: >60 mL/min (ref >60)
GFR calc non Af Amer: >60 mL/min (ref >60)
GLUCOSE: 90 mg/dL (ref 65–99)
POTASSIUM: 3.7 mmol/L (ref 3.5–5.1)
SODIUM: 134 mmol/L — AB (ref 135–145)

## 2017-04-01 LAB — URINALYSIS, ROUTINE W REFLEX MICROSCOPIC
Glucose, UA: NEGATIVE mg/dL
KETONES UR: 15 mg/dL — AB
NITRITE: POSITIVE — AB
Protein, ur: 30 mg/dL — AB
SPECIFIC GRAVITY, URINE: 1.015 (ref 1.005–1.030)
pH: 6 (ref 5.0–8.0)

## 2017-04-01 LAB — CBC WITH DIFFERENTIAL/PLATELET
Basophils Absolute: 0 10*3/uL (ref 0.0–0.1)
Basophils Relative: 0 %
EOS ABS: 0 10*3/uL (ref 0.0–0.7)
Eosinophils Relative: 0 %
HCT: 40.9 % (ref 36.0–46.0)
HEMOGLOBIN: 14.2 g/dL (ref 12.0–15.0)
LYMPHS ABS: 2.8 10*3/uL (ref 0.7–4.0)
LYMPHS PCT: 16 %
MCH: 29.5 pg (ref 26.0–34.0)
MCHC: 34.7 g/dL (ref 30.0–36.0)
MCV: 84.9 fL (ref 78.0–100.0)
Monocytes Absolute: 1.6 10*3/uL — ABNORMAL HIGH (ref 0.1–1.0)
Monocytes Relative: 9 %
Neutro Abs: 12.8 10*3/uL — ABNORMAL HIGH (ref 1.7–7.7)
Neutrophils Relative %: 75 %
Platelets: 224 10*3/uL (ref 150–400)
RBC: 4.82 MIL/uL (ref 3.87–5.11)
RDW: 12.9 % (ref 11.5–15.5)
WBC: 17.2 10*3/uL — ABNORMAL HIGH (ref 4.0–10.5)

## 2017-04-01 LAB — RAPID STREP SCREEN (MED CTR MEBANE ONLY): STREPTOCOCCUS, GROUP A SCREEN (DIRECT): POSITIVE — AB

## 2017-04-01 LAB — PREGNANCY, URINE: Preg Test, Ur: NEGATIVE

## 2017-04-01 MED ORDER — CEPHALEXIN 500 MG PO CAPS: 500.0000 mg | ORAL_CAPSULE | Freq: Four times a day (QID) | ORAL | 0 refills | Status: DC

## 2017-04-01 MED ORDER — SODIUM CHLORIDE 0.9 % IV BOLUS (SEPSIS)
1000.0000 mL | Freq: Once | INTRAVENOUS | Status: AC
  Administered 2017-04-01: 1000 mL via INTRAVENOUS

## 2017-04-01 MED ORDER — PREDNISONE 10 MG PO TABS: 40.0000 mg | ORAL_TABLET | Freq: Every day | ORAL | 0 refills | Status: DC

## 2017-04-01 MED ORDER — DEXTROSE 5 % IV SOLN
1.0000 g | Freq: Once | INTRAVENOUS | Status: AC
  Administered 2017-04-01: 1 g via INTRAVENOUS
  Filled 2017-04-01: qty 10

## 2017-04-01 MED ORDER — IBUPROFEN 800 MG PO TABS
800.0000 mg | ORAL_TABLET | Freq: Once | ORAL | Status: AC
  Administered 2017-04-01: 800 mg via ORAL
  Filled 2017-04-01: qty 1

## 2017-04-01 MED ORDER — SODIUM CHLORIDE 0.9 % IV SOLN: INTRAVENOUS | Status: DC

## 2017-04-01 NOTE — ED Triage Notes (Signed)
Pt reports sore throat x2days. Also reports dysuria, urgency and frequency x2wks (reports taking AZO). Denies v/d. Reports chills and sweats and nausea.

## 2017-04-01 NOTE — ED Notes (Signed)
IV attempts x2, right arm and right foot unsuccessful, EDP aware

## 2017-04-01 NOTE — Discharge Instructions (Signed)
Very important to take antibiotic Keflex. For the strep pharyngitis in the urinary tract infection. Take the prednisone to help with the swelling in the throat. Take it first dose today. Can supplement with over-the-counter Motrin as needed. Return if not improving in 2 days or for any new or worse symptoms.

## 2017-04-01 NOTE — ED Notes (Signed)
ED Provider at bedside. 

## 2017-04-01 NOTE — ED Provider Notes (Signed)
[facility] Note   CSN: [id number] Arrival date & time: 04/01/17  0810     History   Chief Complaint Chief Complaint  Patient presents with  . Sore Throat  . Dysuria    HPI Amy Walls is a 36 y.o. female.  Patient with complaint of sore throat for 2 days. Also has had the urinary dysuria urgency and frequency for 2 weeks. No vomiting or diarrhea. No fevers. Does report chills sweats and some nausea.       Past Medical History:  Diagnosis Date  . ADHD (attention deficit hyperactivity disorder)   . Anxiety   . Arthritis    "inflammation of all my joints"  . Asthma   . Bowel obstruction (HCC) 2007   "upper bowel"  . Chronic bronchitis    "yearly"  . Chronic mid back pain   . Complication of anesthesia 2007   "lost me on the table when I had upper bowel obstruction"  . Constipation, chronic   . Depression   . Fibromyalgia   . Hepatitis C   . Heroin abuse   . Hypoglycemia    "my sugar runs low alot"  . IV drug user   . Kidney infection    "often"  . Migraines 12/28/11   "@ least once/wk"  . PONV (postoperative nausea and vomiting)   . Recurrent UTI     Patient Active Problem List   Diagnosis Date Noted  . Nausea vomiting and diarrhea 08/29/2014  . Intractable vomiting 08/29/2014  . Diarrhea 08/29/2014  . Intussusception (HCC) 01/01/2012  . Acute back pain 12/28/2011  . Abdominal pain 12/28/2011  . Transaminitis 12/28/2011  . Asthma 12/28/2011  . IBS (irritable bowel syndrome) 12/28/2011  . Tobacco abuse 12/28/2011    Past Surgical History:  Procedure Laterality Date  . BOWEL RESECTION  04/2006   "upper bowel"  . OVARY SURGERY  ~ 2002   "had 10 inches blood built up; they opened me up and got the blood out"    OB History    No data available       Home Medications    Prior to Admission medications   Medication Sig Start Date End Date Taking? Authorizing Provider  dicyclomine (BENTYL) 10 MG capsule Take 10 mg by mouth  3 (three) times daily as needed for spasms. 04/27/16 04/27/17 Yes [provider]  Divalproex Sodium (DEPAKOTE PO) Take by mouth.   Yes [provider]  etonogestrel (IMPLANON) 68 MG IMPL implant 1 each by Subdermal route once.   Yes [provider]  FLUoxetine HCl (PROZAC PO) Take by mouth.   Yes [provider]  hydrOXYzine (VISTARIL) 50 MG capsule Take 50 mg by mouth 3 (three) times daily as needed for itching.   Yes [provider]  METHADONE HCL PO Take by mouth.   Yes [provider]  pantoprazole (PROTONIX) 40 MG tablet Take 40 mg by mouth daily.   Yes [provider]  cephALEXin (KEFLEX) 500 MG capsule Take 1 capsule (500 mg total) by mouth 4 (four) times daily. 04/01/17   Vanetta MuldersZackowski, Tinsleigh Slovacek, MD  gabapentin (NEURONTIN) 300 MG capsule Take 300 mg by mouth 3 (three) times daily.    [provider]  hydroxypropyl methylcellulose / hypromellose (ISOPTO TEARS / GONIOVISC) 2.5 % ophthalmic solution Place 1 drop into the left eye 4 (four) times daily. 09/10/16   Lavera GuiseLiu, Tramya Duo, MD  ibuprofen (ADVIL,MOTRIN) 400 MG tablet Take 1 tablet (400 mg total) by  mouth every 6 (six) hours as needed for moderate pain. 04/13/16   Shaune Pollack, MD  ibuprofen (ADVIL,MOTRIN) 800 MG tablet Take 1 tablet (800 mg total) by mouth 3 (three) times daily. 11/17/16   Arby Barrette, MD  metoCLOPramide (REGLAN) 5 MG/5ML solution Take 10 mLs (10 mg total) by mouth 4 (four) times daily -  before meals and at bedtime. 12/02/16 12/07/16  Nira Conn, MD  predniSONE (DELTASONE) 10 MG tablet Take 4 tablets (40 mg total) by mouth daily. 04/01/17   Vanetta Mulders, MD  QUEtiapine (SEROQUEL) 50 MG tablet Take 50 mg by mouth at bedtime.    [provider]  sertraline (ZOLOFT) 100 MG tablet Take 100 mg by mouth daily.    [provider]    Family History Family History  Problem Relation Age of Onset  . Breast cancer Maternal Grandmother     . Colon cancer Maternal Grandmother   . Irritable bowel syndrome Maternal Grandmother   . Diabetes Mother   . Heart disease Father   . Cancer Maternal Aunt        colon cancer   . Vision loss Maternal Grandfather     Social History Social History  Substance Use Topics  . Smoking status: Current Every Day Smoker    Packs/day: 1.00    Years: 16.00    Types: Cigarettes  . Smokeless tobacco: Never Used     Comment: tobacco handout given 01/31/2012  . Alcohol use No     Allergies   Cinnamon; Ciprofloxacin; Levaquin [levofloxacin]; Morphine and related; Penicillins; Toradol [ketorolac tromethamine]; Ultram [tramadol]; Acetaminophen; and Sulfa antibiotics   Review of Systems Review of Systems  Constitutional: Positive for chills, diaphoresis and fever.  HENT: Positive for sore throat. Negative for congestion.   Eyes: Negative for visual disturbance.  Respiratory: Negative for shortness of breath.   Cardiovascular: Negative for chest pain.  Gastrointestinal: Positive for nausea. Negative for abdominal pain, diarrhea and vomiting.  Genitourinary: Positive for dysuria and frequency. Negative for hematuria.  Musculoskeletal: Negative for back pain.  Neurological: Negative for headaches.  Hematological: Does not bruise/bleed easily.  Psychiatric/Behavioral: Negative for confusion.     Physical Exam Updated Vital Signs BP 113/80 (BP Location: Left Arm)   Pulse 69   Temp 98.6 F (37 C) (Oral)   Resp 16   Ht 1.499 m (4\' 11" )   Wt 59 kg (130 lb)   SpO2 100%   BMI 26.26 kg/m   Physical Exam  Constitutional: She is oriented to person, place, and time. She appears well-developed and well-nourished. No distress.  HENT:  Head: Normocephalic and atraumatic.  Mouth/Throat: Oropharyngeal exudate present.  Eyes: Pupils are equal, round, and reactive to light. Conjunctivae and EOM are normal.  Neck: Neck supple.  Cardiovascular: Normal rate, regular rhythm and normal heart  sounds.   Pulmonary/Chest: Effort normal and breath sounds normal. No respiratory distress.  Abdominal: Soft. Bowel sounds are normal. There is no tenderness.  Musculoskeletal: Normal range of motion.  Neurological: She is alert and oriented to person, place, and time. No cranial nerve deficit or sensory deficit. She exhibits normal muscle tone. Coordination normal.  Skin: Skin is warm. No rash noted.  Nursing note and vitals reviewed.    ED Treatments / Results  Labs (all labs ordered are listed, but only abnormal results are displayed) Labs Reviewed  RAPID STREP SCREEN (NOT AT [facility]) - Abnormal; Notable for the following:       Result Value   Streptococcus,  Group A Screen (Direct) POSITIVE (*)    All other components within normal limits  URINALYSIS, ROUTINE W REFLEX MICROSCOPIC - Abnormal; Notable for the following:    Color, Urine AMBER (*)    APPearance TURBID (*)    Hgb urine dipstick SMALL (*)    Bilirubin Urine SMALL (*)    Ketones, ur 15 (*)    Protein, ur 30 (*)    Nitrite POSITIVE (*)    Leukocytes, UA LARGE (*)    All other components within normal limits  URINALYSIS, MICROSCOPIC (REFLEX) - Abnormal; Notable for the following:    Bacteria, UA MANY (*)    Squamous Epithelial / LPF 0-5 (*)    All other components within normal limits  CBC WITH DIFFERENTIAL/PLATELET - Abnormal; Notable for the following:    WBC 17.2 (*)    Neutro Abs 12.8 (*)    Monocytes Absolute 1.6 (*)    All other components within normal limits  BASIC METABOLIC PANEL - Abnormal; Notable for the following:    Sodium 134 (*)    Chloride 99 (*)    All other components within normal limits  URINE CULTURE  PREGNANCY, URINE    EKG  EKG Interpretation None       Radiology No results found.  Procedures Procedures (including critical care time)  Medications Ordered in ED Medications  0.9 %  sodium chloride infusion ( Intravenous Not Given 04/01/17 1157)  ibuprofen (ADVIL,MOTRIN) tablet  800 mg (not administered)  sodium chloride 0.9 % bolus 1,000 mL (0 mLs Intravenous Stopped 04/01/17 1116)  cefTRIAXone (ROCEPHIN) 1 g in dextrose 5 % 50 mL IVPB (0 g Intravenous Stopped 04/01/17 1116)     Initial Impression / Assessment and Plan / ED Course  I have reviewed the triage vital signs and the nursing notes.  Pertinent labs & imaging results that were available during my care of the patient were reviewed by me and considered in my medical decision making (see chart for details).     Patient was significant sore throat with exudate. Strep was positive. Also patient with urinalysis consistent with urinary tract infection. No significant hematuria. The patient received IV Rocephin here. Labs show a leukocytosis but no renal function problems. Patient will be continued on Keflex. Also start her on a course of prednisone for the throat swelling. Patient will return if not improving over the next 2 days. Patient also can take over-the-counter Motrin. Overall patient improved here. Patient also received a liter of fluids.  Patient's vital signs without the concerns for sepsis at this time. Patient was febrile.    Final Clinical Impressions(s) / ED Diagnoses   Final diagnoses:  Strep pharyngitis  Acute cystitis without hematuria    New Prescriptions New Prescriptions   CEPHALEXIN (KEFLEX) 500 MG CAPSULE    Take 1 capsule (500 mg total) by mouth 4 (four) times daily.   PREDNISONE (DELTASONE) 10 MG TABLET    Take 4 tablets (40 mg total) by mouth daily.     Vanetta Mulders, MD 04/01/17 1224

## 2017-04-03 LAB — URINE CULTURE: Culture: >=100000 — AB

## 2017-04-04 ENCOUNTER — Telehealth: Payer: Self-pay | Admitting: *Deleted

## 2017-04-04 NOTE — Telephone Encounter (Signed)
Post ED Visit - Positive Culture Follow-up  Culture report reviewed by antimicrobial stewardship pharmacist:  []  Enzo BiNathan Batchelder, Pharm.D. []  Celedonio MiyamotoJeremy Frens, Pharm.D., BCPS AQ-ID []  Garvin FilaMike Maccia, Pharm.D., BCPS []  Georgina PillionElizabeth Martin, [address]Ph[address]D., BCPS []  [place] Pham, [address]Pharm[address]D., BCPS, AAHIVP []  Estella HuskMichelle Turner, Pharm.D., BCPS, AAHIVP []  Lysle Pearlachel Rumbarger, PharmD, BCPS []  Casilda Carlsaylor Stone, PharmD, BCPS []  Pollyann SamplesAndy Johnston, PharmD, BCPS Verlan FriendsErin Deja, PharmD  Positive urine culture Treated with Cephalexin, organism sensitive to the same and no further patient follow-up is required at this time.  Virl AxeRobertson, Trinitee Horgan [facility] 04/04/2017, 10:30 AM

## 2017-06-05 ENCOUNTER — Encounter (HOSPITAL_BASED_OUTPATIENT_CLINIC_OR_DEPARTMENT_OTHER): Payer: Self-pay | Admitting: Emergency Medicine

## 2017-06-05 ENCOUNTER — Emergency Department (HOSPITAL_BASED_OUTPATIENT_CLINIC_OR_DEPARTMENT_OTHER): Payer: Self-pay

## 2017-06-05 ENCOUNTER — Emergency Department (HOSPITAL_BASED_OUTPATIENT_CLINIC_OR_DEPARTMENT_OTHER)
Admission: EM | Admit: 2017-06-05 | Discharge: 2017-06-05 | Disposition: A | Discharge status: Home / Self Care | Payer: Self-pay | Attending: Emergency Medicine | Admitting: Emergency Medicine

## 2017-06-05 DIAGNOSIS — N10 Acute tubulo-interstitial nephritis: Secondary | ICD-10-CM | POA: Insufficient documentation

## 2017-06-05 DIAGNOSIS — N12 Tubulo-interstitial nephritis, not specified as acute or chronic: Secondary | ICD-10-CM

## 2017-06-05 DIAGNOSIS — J45909 Unspecified asthma, uncomplicated: Secondary | ICD-10-CM | POA: Insufficient documentation

## 2017-06-05 DIAGNOSIS — R300 Dysuria: Secondary | ICD-10-CM | POA: Insufficient documentation

## 2017-06-05 DIAGNOSIS — R112 Nausea with vomiting, unspecified: Secondary | ICD-10-CM | POA: Insufficient documentation

## 2017-06-05 DIAGNOSIS — F17210 Nicotine dependence, cigarettes, uncomplicated: Secondary | ICD-10-CM | POA: Insufficient documentation

## 2017-06-05 DIAGNOSIS — Z79899 Other long term (current) drug therapy: Secondary | ICD-10-CM | POA: Insufficient documentation

## 2017-06-05 LAB — URINALYSIS, ROUTINE W REFLEX MICROSCOPIC
Bilirubin Urine: NEGATIVE
GLUCOSE, UA: NEGATIVE mg/dL
Ketones, ur: NEGATIVE mg/dL
NITRITE: POSITIVE — AB
PROTEIN: 100 mg/dL — AB
Specific Gravity, Urine: 1.02 (ref 1.005–1.030)
pH: 6 (ref 5.0–8.0)

## 2017-06-05 LAB — COMPREHENSIVE METABOLIC PANEL
ALK PHOS: 83 U/L (ref 38–126)
ALT: 109 U/L — AB (ref 14–54)
AST: 174 U/L — ABNORMAL HIGH (ref 15–41)
Albumin: 3.8 g/dL (ref 3.5–5.0)
Anion gap: 7 (ref 5–15)
BILIRUBIN TOTAL: 0.6 mg/dL (ref 0.3–1.2)
BUN: 13 mg/dL (ref 6–20)
CALCIUM: 9.1 mg/dL (ref 8.9–10.3)
CO2: 25 mmol/L (ref 22–32)
CREATININE: 0.91 mg/dL (ref 0.44–1.00)
Chloride: 102 mmol/L (ref 101–111)
Glucose, Bld: 83 mg/dL (ref 65–99)
Potassium: 4 mmol/L (ref 3.5–5.1)
Sodium: 134 mmol/L — ABNORMAL LOW (ref 135–145)
Total Protein: 7.4 g/dL (ref 6.5–8.1)

## 2017-06-05 LAB — CBC
HCT: 44.1 % (ref 36.0–46.0)
Hemoglobin: 14.9 g/dL (ref 12.0–15.0)
MCH: 29.9 pg (ref 26.0–34.0)
MCHC: 33.8 g/dL (ref 30.0–36.0)
MCV: 88.6 fL (ref 78.0–100.0)
PLATELETS: 250 10*3/uL (ref 150–400)
RBC: 4.98 MIL/uL (ref 3.87–5.11)
RDW: 13 % (ref 11.5–15.5)
WBC: 11.8 10*3/uL — AB (ref 4.0–10.5)

## 2017-06-05 LAB — URINALYSIS, MICROSCOPIC (REFLEX)

## 2017-06-05 LAB — PREGNANCY, URINE: PREG TEST UR: NEGATIVE

## 2017-06-05 LAB — LIPASE, BLOOD: Lipase: 26 U/L (ref 11–51)

## 2017-06-05 MED ORDER — FENTANYL CITRATE (PF) 100 MCG/2ML IJ SOLN
50.0000 ug | Freq: Once | INTRAMUSCULAR | Status: AC
  Administered 2017-06-05: 50 ug via INTRAVENOUS
  Filled 2017-06-05: qty 2

## 2017-06-05 MED ORDER — PHENAZOPYRIDINE HCL 95 MG PO TABS: 95.0000 mg | ORAL_TABLET | Freq: Three times a day (TID) | ORAL | 0 refills | Status: DC | PRN

## 2017-06-05 MED ORDER — FENTANYL CITRATE (PF) 100 MCG/2ML IJ SOLN
12.5000 ug | Freq: Once | INTRAMUSCULAR | Status: AC
  Administered 2017-06-05: 12.5 ug via INTRAVENOUS
  Filled 2017-06-05: qty 2

## 2017-06-05 MED ORDER — DEXTROSE 5 % IV SOLN
1.0000 g | Freq: Once | INTRAVENOUS | Status: AC
  Administered 2017-06-05: 1 g via INTRAVENOUS
  Filled 2017-06-05: qty 10

## 2017-06-05 MED ORDER — CEPHALEXIN 500 MG PO CAPS: 500.0000 mg | ORAL_CAPSULE | Freq: Four times a day (QID) | ORAL | 0 refills | Status: DC

## 2017-06-05 MED ORDER — ONDANSETRON HCL 4 MG/2ML IJ SOLN
4.0000 mg | Freq: Once | INTRAMUSCULAR | Status: AC | PRN
  Administered 2017-06-05: 4 mg via INTRAVENOUS
  Filled 2017-06-05: qty 2

## 2017-06-05 MED ORDER — SODIUM CHLORIDE 0.9 % IV BOLUS (SEPSIS)
1000.0000 mL | Freq: Once | INTRAVENOUS | Status: AC
  Administered 2017-06-05: 1000 mL via INTRAVENOUS

## 2017-06-05 MED ORDER — ONDANSETRON HCL 4 MG PO TABS: 4.0000 mg | ORAL_TABLET | Freq: Three times a day (TID) | ORAL | 0 refills | Status: DC | PRN

## 2017-06-05 NOTE — ED Provider Notes (Signed)
[facility]   CSN: [id number] Arrival date & time: 06/05/17  1450     History   Chief Complaint Chief Complaint  Patient presents with  . Hematuria    HPI Amy Walls is a 36 y.o. female with a history of recurrent UTI's, recurrent kidney infections, bowel obstruction, kidney stones, hep c, and IVDU who presents to the ED today for hematuria and flank pain. Patient notes that yesterday evening she started noticing blood in her urine as well as constant left flank pain with radiation to the left lower abdomen that she describes as burning. The pain has been increasing, and is worse when urinating and rates as 10/10. Pain has not changed in location. She is also having associated dysuria, frequency, urgency and N/V since onset. Nausea is constant and emesis is non-bloody and non-bilious. Emesis has occurred >3 times in last 24 hours since yesterday. Has not been able to tolerate PO since last night. Patient has not taken anything for this. She denies any vaginal pain, vaginal discharge, diarrhea, melena, hematochezia. LMP last month but she is unsure of exact date. LBM yesterday and normal. Past bowel surgeries include for bowel resection.   HPI  Past Medical History:  Diagnosis Date  . ADHD (attention deficit hyperactivity disorder)   . Anxiety   . Arthritis    "inflammation of all my joints"  . Asthma   . Bowel obstruction (HCC) 2007   "upper bowel"  . Chronic bronchitis    "yearly"  . Chronic mid back pain   . Complication of anesthesia 2007   "lost me on the table when I had upper bowel obstruction"  . Constipation, chronic   . Depression   . Fibromyalgia   . Hepatitis C   . Heroin abuse (HCC)   . Hypoglycemia    "my sugar runs low alot"  . IV drug user   . Kidney infection    "often"  . Migraines 12/28/11   "@ least once/wk"  . PONV (postoperative nausea and vomiting)   . Recurrent UTI     Patient Active Problem List   Diagnosis Date  Noted  . Nausea vomiting and diarrhea 08/29/2014  . Intractable vomiting 08/29/2014  . Diarrhea 08/29/2014  . Intussusception (HCC) 01/01/2012  . Acute back pain 12/28/2011  . Abdominal pain 12/28/2011  . Transaminitis 12/28/2011  . Asthma 12/28/2011  . IBS (irritable bowel syndrome) 12/28/2011  . Tobacco abuse 12/28/2011    Past Surgical History:  Procedure Laterality Date  . BOWEL RESECTION  04/2006   "upper bowel"  . OVARY SURGERY  ~ 2002   "had 10 inches blood built up; they opened me up and got the blood out"    OB History    No data available       Home Medications    Prior to Admission medications   Medication Sig Start Date End Date Taking? Authorizing Provider  cephALEXin (KEFLEX) 500 MG capsule Take 1 capsule (500 mg total) by mouth 4 (four) times daily. 06/05/17   Maczis, Elmer Sow, PA-C  Divalproex Sodium (DEPAKOTE PO) Take by mouth.    [provider]  etonogestrel (IMPLANON) 68 MG IMPL implant 1 each by Subdermal route once.    [provider]  FLUoxetine HCl (PROZAC PO) Take by mouth.    [provider]  gabapentin (NEURONTIN) 300 MG capsule Take 300 mg by mouth 3 (three) times daily.    [provider]  hydroxypropyl methylcellulose / hypromellose (  ISOPTO TEARS / GONIOVISC) 2.5 % ophthalmic solution Place 1 drop into the left eye 4 (four) times daily. 09/10/16   Lavera Guise, MD  hydrOXYzine (VISTARIL) 50 MG capsule Take 50 mg by mouth 3 (three) times daily as needed for itching.    [provider]  ibuprofen (ADVIL,MOTRIN) 400 MG tablet Take 1 tablet (400 mg total) by mouth every 6 (six) hours as needed for moderate pain. 04/13/16   Shaune Pollack, MD  ibuprofen (ADVIL,MOTRIN) 800 MG tablet Take 1 tablet (800 mg total) by mouth 3 (three) times daily. 11/17/16   Arby Barrette, MD  METHADONE HCL PO Take by mouth.    [provider]  metoCLOPramide (REGLAN) 5 MG/5ML solution Take 10 mLs (10 mg total) by mouth  4 (four) times daily -  before meals and at bedtime. 12/02/16 12/07/16  Nira Conn, MD  ondansetron (ZOFRAN) 4 MG tablet Take 1 tablet (4 mg total) by mouth every 8 (eight) hours as needed for nausea or vomiting. 06/05/17   Maczis, Elmer Sow, PA-C  pantoprazole (PROTONIX) 40 MG tablet Take 40 mg by mouth daily.    [provider]  phenazopyridine (PYRIDIUM) 95 MG tablet Take 1 tablet (95 mg total) by mouth 3 (three) times daily as needed for pain. 06/05/17   Maczis, Elmer Sow, PA-C  predniSONE (DELTASONE) 10 MG tablet Take 4 tablets (40 mg total) by mouth daily. 04/01/17   Vanetta Mulders, MD  QUEtiapine (SEROQUEL) 50 MG tablet Take 50 mg by mouth at bedtime.    [provider]  sertraline (ZOLOFT) 100 MG tablet Take 100 mg by mouth daily.    [provider]    Family History Family History  Problem Relation Age of Onset  . Breast cancer Maternal Grandmother   . Colon cancer Maternal Grandmother   . Irritable bowel syndrome Maternal Grandmother   . Diabetes Mother   . Heart disease Father   . Cancer Maternal Aunt        colon cancer   . Vision loss Maternal Grandfather     Social History Social History  Substance Use Topics  . Smoking status: Current Every Day Smoker    Packs/day: 1.00    Years: 16.00    Types: Cigarettes  . Smokeless tobacco: Never Used     Comment: tobacco handout given 01/31/2012  . Alcohol use No     Allergies   Cinnamon; Ciprofloxacin; Levaquin [levofloxacin]; Morphine and related; Penicillins; Toradol [ketorolac tromethamine]; Ultram [tramadol]; Acetaminophen; and Sulfa antibiotics   Review of Systems Review of Systems  All other systems reviewed and are negative.    Physical Exam Updated Vital Signs BP 114/76 (BP Location: Right Arm)   Pulse 92   Temp 98 F (36.7 C) (Oral)   Resp (!) 22   Ht  (1.499 m)   Wt 56.7 kg (125 lb)   LMP 06/04/2017   SpO2 100%   BMI 25.25 kg/m   Physical Exam    Constitutional: She appears well-developed and well-nourished.  Patient appears in mild distress due to pain. Lying on her right side  HENT:  Head: Normocephalic and atraumatic.  Right Ear: External ear normal.  Left Ear: External ear normal.  Nose: Nose normal.  Mouth/Throat: Uvula is midline, oropharynx is clear and moist and mucous membranes are normal. No tonsillar exudate.  Poor dentition  Eyes: Pupils are equal, round, and reactive to light. Right eye exhibits no discharge. Left eye exhibits no discharge. No scleral icterus.  Neck: Trachea normal. Neck supple. No spinous process tenderness present. No neck rigidity. Normal range of motion present.  Cardiovascular: Normal rate, regular rhythm and intact distal pulses.   No murmur heard. Pulses:      Radial pulses are 2+ on the right side, and 2+ on the left side.       Dorsalis pedis pulses are 2+ on the right side, and 2+ on the left side.       Posterior tibial pulses are 2+ on the right side, and 2+ on the left side.  No lower extremity swelling or edema. Calves symmetric in size bilaterally.  Pulmonary/Chest: Effort normal and breath sounds normal. She exhibits no tenderness.  Abdominal: Soft. Bowel sounds are normal. She exhibits no distension. There is no tenderness. There is CVA tenderness (left). There is no rebound and no guarding.  Musculoskeletal: She exhibits no edema.  Lymphadenopathy:    She has no cervical adenopathy.  Neurological: She is alert.  Speech clear. Follows commands. No facial droop. PERRLA. EOM grossly intact. CN III-XII grossly intact. Grossly moves all extremities 4 without ataxia. Able and appropriate strength for age to upper and lower extremities bilaterally including grip strength.   Skin: Skin is warm and dry. No rash noted. She is not diaphoretic.  Psychiatric: She has a normal mood and affect.  Nursing note and vitals reviewed.    ED Treatments / Results  Labs (all labs ordered are listed,  but only abnormal results are displayed) Labs Reviewed  URINALYSIS, ROUTINE W REFLEX MICROSCOPIC - Abnormal; Notable for the following:       Result Value   APPearance CLOUDY (*)    Hgb urine dipstick LARGE (*)    Protein, ur 100 (*)    Nitrite POSITIVE (*)    Leukocytes, UA LARGE (*)    All other components within normal limits  URINALYSIS, MICROSCOPIC (REFLEX) - Abnormal; Notable for the following:    Bacteria, UA MANY (*)    Squamous Epithelial / LPF 0-5 (*)    All other components within normal limits  COMPREHENSIVE METABOLIC PANEL - Abnormal; Notable for the following:    Sodium 134 (*)    AST 174 (*)    ALT 109 (*)    All other components within normal limits  CBC - Abnormal; Notable for the following:    WBC 11.8 (*)    All other components within normal limits  URINE CULTURE  PREGNANCY, URINE  LIPASE, BLOOD    EKG  EKG Interpretation None       Radiology Ct Renal Stone Study  Result Date: 06/05/2017 CLINICAL DATA:  Gross hematuria.  Left flank pain. EXAM: CT ABDOMEN AND PELVIS WITHOUT CONTRAST TECHNIQUE: Multidetector CT imaging of the abdomen and pelvis was performed following the standard protocol without IV contrast. COMPARISON:  Body CT 11/11/2015 FINDINGS: Lower chest: No acute abnormality. Hepatobiliary: No focal liver abnormality is seen. No gallstones, gallbladder wall thickening, or biliary dilatation. Pancreas: Unremarkable. No pancreatic ductal dilatation or surrounding inflammatory changes. Spleen: Normal in size without focal abnormality. Adrenals/Urinary Tract: Adrenal glands are unremarkable. Tiny 1-2 mm nonobstructive calculus in the upper pole of the right kidney. Tiny nonobstructive renal calculus in the lower pole of the left kidney. Mild left hydroureter and hydronephrosis. No obstructive calculus is seen however. Mild left hydronephrosis and hydroureter. Bladder is unremarkable. Stomach/Bowel: Stomach is within normal limits. Appendix appears normal.  No evidence of bowel wall thickening, distention, or inflammatory changes. Vascular/Lymphatic: No significant vascular findings are  present. No enlarged abdominal or pelvic lymph nodes. Reproductive: Uterus and bilateral adnexa are unremarkable. Other: No abdominal wall hernia or abnormality. No abdominopelvic ascites. Musculoskeletal: No acute or significant osseous findings. IMPRESSION: Bilateral tiny nonobstructive renal calculi . Mild left hydronephrosis and hydroureter. An obstructive calculus is not seen. Findings are suggestive of recently passed calculus or radiolucent debris within the distal left ureter. Electronically Signed   By: Ted Mcalpine M.D.   On: 06/05/2017 16:57    Procedures Procedures (including critical care time)  Medications Ordered in ED Medications  ondansetron (ZOFRAN) injection 4 mg (4 mg Intravenous Given 06/05/17 1621)  sodium chloride 0.9 % bolus 1,000 mL (0 mLs Intravenous Stopped 06/05/17 1724)  fentaNYL (SUBLIMAZE) injection 50 mcg (50 mcg Intravenous Given 06/05/17 1630)  cefTRIAXone (ROCEPHIN) 1 g in dextrose 5 % 50 mL IVPB (0 g Intravenous Stopped 06/05/17 1808)  fentaNYL (SUBLIMAZE) injection 12.5 mcg (12.5 mcg Intravenous Given 06/05/17 1833)     Initial Impression / Assessment and Plan / ED Course  I have reviewed the triage vital signs and the nursing notes.  Pertinent labs & imaging results that were available during my care of the patient were reviewed by me and considered in my medical decision making (see chart for details).     36 y.o. female here today with flank pain, N/V and urinary symptoms. Vital signs are reassuring and without fever, tachycardia, tachypnea, hypoxia or hypotension. Patient appears mildly distressed due to pain. Very tender CVA on left side. Patient given fluids, pain medication and anti-nausea medication. CMP reassuring with good kidney function. LFT abn but appear at patient baseline. No RUQ TTP. WBC with mild  leukocytosis of 11.8. CBC otherwise reassuring. Lipase unremarkable. UA with signs of infection. CT scan with mild left hydronpehrosis and hydroureter. Readings suggests recently passed calculus. Will treat as pyelo. Patient with multiple allergies to medications, however appears to have tolerated IV Rocephin and Oral Keflex in the past. Will give dose of Rocephin in the department and discharge on Keflex. Urine culture sent. Patient case discussed with Dr. Rubin Payor who is in agreement with plan. Patient nausea subsiding. No emesis in department. Patient to follow up with PCP in next 2-3 days. Specific return precautions discussed. The patient verbalized understanding and agreement with plan. All questions answered. No further questions at this time. The patient is hemodynamically stable, mentating appropriately and appears safe for discharge.  Final Clinical Impressions(s) / ED Diagnoses   Final diagnoses:  Pyelonephritis    New Prescriptions Discharge Medication List as of 06/05/2017  5:39 PM    START taking these medications   Details  ondansetron (ZOFRAN) 4 MG tablet Take 1 tablet (4 mg total) by mouth every 8 (eight) hours as needed for nausea or vomiting., Starting Sun 06/05/2017, Print         Maczis, Elmer Sow, PA-C 06/06/17 1439    Benjiman Core, MD 06/06/17 2214

## 2017-06-05 NOTE — ED Notes (Signed)
Pt states she began noticing blood in her urine last p.m. Also c/o low abd pain radiating to back. +nausea/vomiting. Admits to heroin use on Monday. "Getting my life straightened out."

## 2017-06-05 NOTE — ED Notes (Signed)
ED Provider at bedside. 

## 2017-06-05 NOTE — ED Notes (Signed)
Patient transported to CT 

## 2017-06-05 NOTE — ED Notes (Signed)
Waiting for transportation home before receiving last does of Fentanyl

## 2017-06-05 NOTE — ED Triage Notes (Signed)
Patient states that she is having pain to her left flank and left lower quadrant  - patient reports that she is having blood in her urine. She is unsure if she is having problems with her liver or something else

## 2017-06-05 NOTE — Discharge Instructions (Signed)
Please read and follow all provided instructions You were seen here today for your left-sided flank pain as well as urinary symptoms and nausea and vomiting. Your CT scan as well as lab work shows that you have pyelonephritis also known as a kidney infection. I have attached a handout on this. You have been given antibiotics in the department and her being sent home with antibiotics for this. Please take all of your antibiotics until finished!   You may develop abdominal discomfort or diarrhea from the antibiotic.  You may help offset this with probiotics which you can buy or get in yogurt. Do not eat or take the probiotics until 2 hours after your antibiotic. Do not take your medicine if develop an itchy rash, swelling in your mouth or lips, or difficulty breathing. Your urine has been sent for culture to ensure that the anabolic placed on will cover the bacteria that is causing or kidney infection. You will receive a call if your antibiotic is needed to be changed. Please take Zofran as needed for nausea control. Please follow-up with your primary care provider this week. If you develop worsening or new concerning symptoms you can return to the emergency department for re-evaluation.   Your vital signs today were: BP 96/69 (BP Location: Left Arm)    Pulse 89    Temp 98 F (36.7 C) (Oral)    Resp (!) 24    Ht  (1.499 m)    Wt 56.7 kg (125 lb)    LMP 06/04/2017    SpO2 100%    BMI 25.25 kg/m  If your blood pressure (BP) was elevated above 135/85 this visit, please have this repeated by your doctor within one month. ---------------

## 2017-06-08 LAB — URINE CULTURE

## 2017-06-09 ENCOUNTER — Telehealth (HOSPITAL_BASED_OUTPATIENT_CLINIC_OR_DEPARTMENT_OTHER): Payer: Self-pay | Admitting: Emergency Medicine

## 2017-06-09 NOTE — Telephone Encounter (Signed)
Post ED Visit - Positive Culture Follow-up  Culture report reviewed by antimicrobial stewardship pharmacist:  []  Enzo BiNathan Batchelder, Pharm.D. []  Celedonio MiyamotoJeremy Frens, Pharm.D., BCPS AQ-ID []  Garvin FilaMike Maccia, Pharm.D., BCPS []  Georgina PillionElizabeth Martin, Pharm.D., BCPS []  HamlerMinh Pham, [address] Rainbow BoulevardPharm.D., BCPS, AAHIVP []  Estella HuskMichelle Turner, Pharm.D., BCPS, AAHIVP []  Lysle Pearlachel Rumbarger, PharmD, BCPS []  Casilda Carlsaylor Stone, PharmD, BCPS []  Pollyann SamplesAndy Johnston, PharmD, BCPS  Positive urine culture Treated with cephalexin, organism sensitive to the same and no further patient follow-up is required at this time.  Berle MullMiller, Nevah Dalal 06/09/2017, 12:24 PM

## 2018-05-31 ENCOUNTER — Other Ambulatory Visit: Payer: Self-pay

## 2018-05-31 ENCOUNTER — Encounter (HOSPITAL_BASED_OUTPATIENT_CLINIC_OR_DEPARTMENT_OTHER): Payer: Self-pay

## 2018-05-31 ENCOUNTER — Emergency Department (HOSPITAL_BASED_OUTPATIENT_CLINIC_OR_DEPARTMENT_OTHER)
Admission: EM | Admit: 2018-05-31 | Discharge: 2018-05-31 | Discharge status: Against Medical Advice | Payer: Self-pay | Attending: Emergency Medicine | Admitting: Emergency Medicine

## 2018-05-31 DIAGNOSIS — Y999 Unspecified external cause status: Secondary | ICD-10-CM | POA: Insufficient documentation

## 2018-05-31 DIAGNOSIS — Z5321 Procedure and treatment not carried out due to patient leaving prior to being seen by health care provider: Secondary | ICD-10-CM | POA: Insufficient documentation

## 2018-05-31 DIAGNOSIS — S0032XA Blister (nonthermal) of nose, initial encounter: Secondary | ICD-10-CM | POA: Insufficient documentation

## 2018-05-31 DIAGNOSIS — Y939 Activity, unspecified: Secondary | ICD-10-CM | POA: Insufficient documentation

## 2018-05-31 DIAGNOSIS — Y929 Unspecified place or not applicable: Secondary | ICD-10-CM | POA: Insufficient documentation

## 2018-05-31 DIAGNOSIS — X58XXXA Exposure to other specified factors, initial encounter: Secondary | ICD-10-CM | POA: Insufficient documentation

## 2018-05-31 NOTE — ED Triage Notes (Signed)
C/o red blister like area below nose x 1 week-NAD-steady gait

## 2018-07-24 ENCOUNTER — Emergency Department (HOSPITAL_BASED_OUTPATIENT_CLINIC_OR_DEPARTMENT_OTHER)
Admission: EM | Admit: 2018-07-24 | Discharge: 2018-07-24 | Disposition: A | Discharge status: Home / Self Care | Payer: Self-pay | Attending: Emergency Medicine | Admitting: Emergency Medicine

## 2018-07-24 ENCOUNTER — Encounter (HOSPITAL_BASED_OUTPATIENT_CLINIC_OR_DEPARTMENT_OTHER): Payer: Self-pay | Admitting: Emergency Medicine

## 2018-07-24 ENCOUNTER — Emergency Department (HOSPITAL_BASED_OUTPATIENT_CLINIC_OR_DEPARTMENT_OTHER): Payer: Self-pay

## 2018-07-24 ENCOUNTER — Other Ambulatory Visit: Payer: Self-pay

## 2018-07-24 DIAGNOSIS — F17210 Nicotine dependence, cigarettes, uncomplicated: Secondary | ICD-10-CM | POA: Insufficient documentation

## 2018-07-24 DIAGNOSIS — F909 Attention-deficit hyperactivity disorder, unspecified type: Secondary | ICD-10-CM | POA: Insufficient documentation

## 2018-07-24 DIAGNOSIS — F1110 Opioid abuse, uncomplicated: Secondary | ICD-10-CM | POA: Insufficient documentation

## 2018-07-24 DIAGNOSIS — R1013 Epigastric pain: Secondary | ICD-10-CM | POA: Insufficient documentation

## 2018-07-24 DIAGNOSIS — F419 Anxiety disorder, unspecified: Secondary | ICD-10-CM | POA: Insufficient documentation

## 2018-07-24 DIAGNOSIS — J45909 Unspecified asthma, uncomplicated: Secondary | ICD-10-CM | POA: Insufficient documentation

## 2018-07-24 DIAGNOSIS — F329 Major depressive disorder, single episode, unspecified: Secondary | ICD-10-CM | POA: Insufficient documentation

## 2018-07-24 LAB — CBC WITH DIFFERENTIAL/PLATELET
ABS IMMATURE GRANULOCYTES: 0.02 10*3/uL (ref 0.00–0.07)
Basophils Absolute: 0 10*3/uL (ref 0.0–0.1)
Basophils Relative: 0 %
Eosinophils Absolute: 0.1 10*3/uL (ref 0.0–0.5)
Eosinophils Relative: 1 %
HCT: 43.8 % (ref 36.0–46.0)
Hemoglobin: 14.4 g/dL (ref 12.0–15.0)
IMMATURE GRANULOCYTES: 0 %
Lymphocytes Relative: 40 %
Lymphs Abs: 2.9 10*3/uL (ref 0.7–4.0)
MCH: 30.2 pg (ref 26.0–34.0)
MCHC: 32.9 g/dL (ref 30.0–36.0)
MCV: 91.8 fL (ref 80.0–100.0)
Monocytes Absolute: 0.5 10*3/uL (ref 0.1–1.0)
Monocytes Relative: 8 %
Neutro Abs: 3.6 10*3/uL (ref 1.7–7.7)
Neutrophils Relative %: 51 %
Platelets: 277 10*3/uL (ref 150–400)
RBC: 4.77 MIL/uL (ref 3.87–5.11)
RDW: 11.1 % — ABNORMAL LOW (ref 11.5–15.5)
WBC: 7.1 10*3/uL (ref 4.0–10.5)
nRBC: 0 % (ref 0.0–0.2)

## 2018-07-24 LAB — PREGNANCY, URINE: Preg Test, Ur: NEGATIVE

## 2018-07-24 LAB — URINALYSIS, ROUTINE W REFLEX MICROSCOPIC
Bilirubin Urine: NEGATIVE
Glucose, UA: NEGATIVE mg/dL
Hgb urine dipstick: NEGATIVE
Ketones, ur: NEGATIVE mg/dL
LEUKOCYTES UA: NEGATIVE
Nitrite: NEGATIVE
Protein, ur: NEGATIVE mg/dL
Specific Gravity, Urine: 1.025 (ref 1.005–1.030)
pH: 6 (ref 5.0–8.0)

## 2018-07-24 LAB — COMPREHENSIVE METABOLIC PANEL
ALK PHOS: 68 U/L (ref 38–126)
ALT: 107 U/L — ABNORMAL HIGH (ref 0–44)
AST: 158 U/L — ABNORMAL HIGH (ref 15–41)
Albumin: 4.1 g/dL (ref 3.5–5.0)
Anion gap: 7 (ref 5–15)
BUN: 7 mg/dL (ref 6–20)
CALCIUM: 9.5 mg/dL (ref 8.9–10.3)
CO2: 26 mmol/L (ref 22–32)
Chloride: 105 mmol/L (ref 98–111)
Creatinine, Ser: 0.77 mg/dL (ref 0.44–1.00)
GFR calc Af Amer: >60 mL/min (ref >60)
GFR calc non Af Amer: >60 mL/min (ref >60)
Glucose, Bld: 100 mg/dL — ABNORMAL HIGH (ref 70–99)
Potassium: 3.8 mmol/L (ref 3.5–5.1)
Sodium: 138 mmol/L (ref 135–145)
Total Bilirubin: 0.9 mg/dL (ref 0.3–1.2)
Total Protein: 7 g/dL (ref 6.5–8.1)

## 2018-07-24 LAB — LIPASE, BLOOD: Lipase: 41 U/L (ref 11–51)

## 2018-07-24 MED ORDER — IOPAMIDOL (ISOVUE-300) INJECTION 61%
100.0000 mL | Freq: Once | INTRAVENOUS | Status: AC | PRN
  Administered 2018-07-24: 100 mL via INTRAVENOUS

## 2018-07-24 MED ORDER — LACTATED RINGERS IV BOLUS
1000.0000 mL | Freq: Once | INTRAVENOUS | Status: AC
  Administered 2018-07-24: 1000 mL via INTRAVENOUS

## 2018-07-24 MED ORDER — FENTANYL CITRATE (PF) 100 MCG/2ML IJ SOLN
50.0000 ug | Freq: Once | INTRAMUSCULAR | Status: AC
  Administered 2018-07-24: 50 ug via INTRAVENOUS
  Filled 2018-07-24: qty 2

## 2018-07-24 MED ORDER — PROCHLORPERAZINE EDISYLATE 10 MG/2ML IJ SOLN
10.0000 mg | Freq: Once | INTRAMUSCULAR | Status: AC
  Administered 2018-07-24: 10 mg via INTRAVENOUS
  Filled 2018-07-24: qty 2

## 2018-07-24 MED ORDER — PANTOPRAZOLE SODIUM 20 MG PO TBEC: 20.0000 mg | DELAYED_RELEASE_TABLET | Freq: Every day | ORAL | 0 refills | Status: DC

## 2018-07-24 MED ORDER — ONDANSETRON HCL 4 MG/2ML IJ SOLN
4.0000 mg | Freq: Once | INTRAMUSCULAR | Status: AC
  Administered 2018-07-24: 4 mg via INTRAVENOUS
  Filled 2018-07-24: qty 2

## 2018-07-24 MED ORDER — ONDANSETRON HCL 4 MG PO TABS: 4.0000 mg | ORAL_TABLET | Freq: Four times a day (QID) | ORAL | 0 refills | Status: AC

## 2018-07-24 NOTE — ED Triage Notes (Signed)
Vomiting and flank pain x1 week.

## 2018-07-24 NOTE — Discharge Instructions (Addendum)
Follow up with primary care doctor about pancreatic cyst. Need imaging in the future to monitor.

## 2018-07-24 NOTE — ED Provider Notes (Signed)
[facility] EMERGENCY DEPARTMENT Provider Note   CSN: [id number] Arrival date & time: 07/24/18  4098     History   Chief Complaint Chief Complaint  Patient presents with  . Emesis    HPI Amy Walls is a 37 y.o. female.  The history is provided by the patient.  Emesis   This is a new problem. The current episode started 2 days ago. The problem occurs 5 to 10 times per day. The problem has not changed since onset.The emesis has an appearance of stomach contents. There has been no fever. The fever has been present for less than 1 day. Associated symptoms include abdominal pain. Pertinent negatives include no arthralgias, no chills, no cough, no diarrhea, no fever, no headaches, no myalgias, no sweats and no URI.    Past Medical History:  Diagnosis Date  . ADHD (attention deficit hyperactivity disorder)   . Anxiety   . Arthritis    "inflammation of all my joints"  . Asthma   . Bowel obstruction (HCC) 2007   "upper bowel"  . Chronic bronchitis    "yearly"  . Chronic mid back pain   . Complication of anesthesia 2007   "lost me on the table when I had upper bowel obstruction"  . Constipation, chronic   . Depression   . Fibromyalgia   . Hepatitis C   . Heroin abuse (HCC)   . Hypoglycemia    "my sugar runs low alot"  . IV drug user   . Kidney infection    "often"  . Migraines 12/28/11   "@ least once/wk"  . PONV (postoperative nausea and vomiting)   . Recurrent UTI     Patient Active Problem List   Diagnosis Date Noted  . Nausea vomiting and diarrhea 08/29/2014  . Intractable vomiting 08/29/2014  . Diarrhea 08/29/2014  . Intussusception (HCC) 01/01/2012  . Acute back pain 12/28/2011  . Abdominal pain 12/28/2011  . Transaminitis 12/28/2011  . Asthma 12/28/2011  . IBS (irritable bowel syndrome) 12/28/2011  . Tobacco abuse 12/28/2011    Past Surgical History:  Procedure Laterality Date  . BOWEL RESECTION  04/2006   "upper bowel"  . OVARY SURGERY  ~  2002   "had 10 inches blood built up; they opened me up and got the blood out"     OB History   None      Home Medications    Prior to Admission medications   Medication Sig Start Date End Date Taking? Authorizing Provider  etonogestrel (IMPLANON) 68 MG IMPL implant 1 each by Subdermal route once.    [provider]  ondansetron (ZOFRAN) 4 MG tablet Take 1 tablet (4 mg total) by mouth every 6 (six) hours for 15 doses. 07/24/18 07/28/18  Karey Stucki, DO  pantoprazole (PROTONIX) 20 MG tablet Take 1 tablet (20 mg total) by mouth daily. 07/24/18 08/23/18  Virgina Norfolk, DO    Family History Family History  Problem Relation Age of Onset  . Breast cancer Maternal Grandmother   . Colon cancer Maternal Grandmother   . Irritable bowel syndrome Maternal Grandmother   . Diabetes Mother   . Heart disease Father   . Cancer Maternal Aunt        colon cancer   . Vision loss Maternal Grandfather     Social History Social History   Tobacco Use  . Smoking status: Current Every Day Smoker    Packs/day: 0.50    Years: 16.00    Pack years: 8.00  Types: Cigarettes  . Smokeless tobacco: Never Used  Substance Use Topics  . Alcohol use: No  . Drug use: No    Comment: history of IVDU      Allergies   Cinnamon; Ciprofloxacin; Levaquin [levofloxacin]; Morphine and related; Penicillins; Toradol [ketorolac tromethamine]; Ultram [tramadol]; Acetaminophen; and Sulfa antibiotics   Review of Systems Review of Systems  Constitutional: Negative for chills and fever.  HENT: Negative for ear pain and sore throat.   Eyes: Negative for pain and visual disturbance.  Respiratory: Negative for cough and shortness of breath.   Cardiovascular: Negative for chest pain and palpitations.  Gastrointestinal: Positive for abdominal pain and vomiting. Negative for diarrhea.  Genitourinary: Negative for dysuria and hematuria.  Musculoskeletal: Negative for arthralgias, back pain and myalgias.    Skin: Negative for color change and rash.  Neurological: Negative for seizures, syncope and headaches.  All other systems reviewed and are negative.    Physical Exam Updated Vital Signs BP 117/76 (BP Location: Right Arm)   Pulse 71   Temp 98.5 F (36.9 C) (Oral)   Resp 18   Ht 4\' 11"  (1.499 m)   Wt 63.5 kg   SpO2 98%   BMI 28.28 kg/m   Physical Exam  Constitutional: She is oriented to person, place, and time. She appears well-developed and well-nourished. No distress.  HENT:  Head: Normocephalic and atraumatic.  Mouth/Throat: No oropharyngeal exudate.  Eyes: Pupils are equal, round, and reactive to light. Conjunctivae and EOM are normal.  Neck: Normal range of motion. Neck supple.  Cardiovascular: Normal rate, regular rhythm, normal heart sounds and intact distal pulses.  No murmur heard. Pulmonary/Chest: Effort normal and breath sounds normal. No respiratory distress.  Abdominal: Soft. There is tenderness.  Musculoskeletal: Normal range of motion. She exhibits no edema.  Neurological: She is alert and oriented to person, place, and time.  Skin: Skin is warm and dry. Capillary refill takes less than 2 seconds.  Psychiatric: She has a normal mood and affect.  Nursing note and vitals reviewed.    ED Treatments / Results  Labs (all labs ordered are listed, but only abnormal results are displayed) Labs Reviewed  COMPREHENSIVE METABOLIC PANEL - Abnormal; Notable for the following components:      Result Value   Glucose, Bld 100 (*)    AST 158 (*)    ALT 107 (*)    All other components within normal limits  CBC WITH DIFFERENTIAL/PLATELET - Abnormal; Notable for the following components:   RDW 11.1 (*)    All other components within normal limits  LIPASE, BLOOD  URINALYSIS, ROUTINE W REFLEX MICROSCOPIC  PREGNANCY, URINE    EKG None  Radiology Ct Abdomen Pelvis W Contrast  Result Date: 07/24/2018 CLINICAL DATA:  Epigastric and left upper quadrant pain with  vomiting for the past week. EXAM: CT ABDOMEN AND PELVIS WITH CONTRAST TECHNIQUE: Multidetector CT imaging of the abdomen and pelvis was performed using the standard protocol following bolus administration of intravenous contrast. CONTRAST:  100mL ISOVUE-300 IOPAMIDOL (ISOVUE-300) INJECTION 61% COMPARISON:  CT abdomen pelvis dated June 05, 2017. FINDINGS: Lower chest: No acute abnormality. Hepatobiliary: No focal liver abnormality is seen. No gallstones, gallbladder wall thickening, or biliary dilatation. Pancreas: Unchanged tiny 4 mm low-density lesion in the pancreatic body, stable since March 2017. No ductal dilatation or surrounding inflammatory changes. Spleen: Normal in size without focal abnormality. Adrenals/Urinary Tract: Adrenal glands are unremarkable. Kidneys are normal, without renal calculi, focal lesion, or hydronephrosis. Bladder is decompressed. Stomach/Bowel:  Stomach is within normal limits. Appendix appears normal. No evidence of bowel wall thickening, distention, or inflammatory changes. Vascular/Lymphatic: No significant vascular findings are present. No enlarged abdominal or pelvic lymph nodes. Reproductive: Uterus and bilateral adnexa are unremarkable. Other: Unchanged small fat containing supraumbilical ventral hernia. Trace free fluid in the pelvis is likely physiologic. No pneumoperitoneum. Musculoskeletal: No acute or significant osseous findings. IMPRESSION: 1.  No acute intra-abdominal process.  No bowel obstruction. 2. Tiny 4 mm cystic lesion in the pancreatic body, stable since March 2017. Continued annual follow-up with pancreatic protocol CT or MRI is recommended for another 2 years. If the lesion remains stable, then follow-up imaging may be obtained every 2 years until 9 years of stability is established. This recommendation follows ACR consensus guidelines: Management of Incidental Pancreatic Cysts: A White Paper of the ACR Incidental Findings Committee. J Am Coll Radiol  2017;14:911-923. Electronically Signed   By: Obie Dredge M.D.   On: 07/24/2018 11:17    Procedures Procedures (including critical care time)  Medications Ordered in ED Medications  ondansetron (ZOFRAN) injection 4 mg (4 mg Intravenous Given 07/24/18 0916)  lactated ringers bolus 1,000 mL ( Intravenous Stopped 07/24/18 1033)  fentaNYL (SUBLIMAZE) injection 50 mcg (50 mcg Intravenous Given 07/24/18 1001)  iopamidol (ISOVUE-300) 61 % injection 100 mL (100 mLs Intravenous Contrast Given 07/24/18 1047)  prochlorperazine (COMPAZINE) injection 10 mg (10 mg Intravenous Given 07/24/18 1135)     Initial Impression / Assessment and Plan / ED Course  I have reviewed the triage vital signs and the nursing notes.  Pertinent labs & imaging results that were available during my care of the patient were reviewed by me and considered in my medical decision making (see chart for details).     Shenea Giacobbe is a 37 year old female history of chronic pain, fibromyalgia, bowel obstruction who presents to the ED with epigastric abdominal pain.  Patient with normal vitals.  No fever.  Patient with epigastric abdominal pain on exam.  She states that she has not passed any gas today.  Has had difficulty holding fluids down.  Denies any vaginal discharge, vaginal bleeding.  No pain with urination.  No history of kidney stones.  Patient mostly tender in epigastric region on exam.  Patient overall well-appearing.  CT scan showed no acute findings.  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  She was made aware of need for future monitoring.  No signs of urinary tract infection.  Negative pregnancy test.  No significant gallbladder liver enzyme elevation.  Lipase within normal limits doubt pancreatitis.  No significant leukocytosis, anemia, electrolyte abnormality, kidney injury.  Patient possibly with some gastritis versus reflux.  Given prescription for Protonix and Zofran and discharged from ED in good  condition. F/u with PCP. Felt better after fluids and nausea medicine and pain medicine.   This chart was dictated using voice recognition software.  Despite best efforts to proofread,  errors can occur which can change the documentation meaning.   Final Clinical Impressions(s) / ED Diagnoses   Final diagnoses:  Epigastric pain    ED Discharge Orders         Ordered    ondansetron (ZOFRAN) 4 MG tablet  Every 6 hours     07/24/18 1132    pantoprazole (PROTONIX) 20 MG tablet  Daily     07/24/18 1132           Virgina Norfolk, DO 07/24/18 1136

## 2018-07-24 NOTE — ED Notes (Signed)
ED Provider at bedside. 

## 2018-07-24 NOTE — ED Notes (Signed)
Pt states unable to void at this time. 

## 2018-08-24 NOTE — Congregational Nurse Program (Signed)
No health concerns voiced at this visit

## 2018-12-25 ENCOUNTER — Ambulatory Visit: Payer: Medicaid Other | Admitting: Family Medicine

## 2019-01-29 ENCOUNTER — Ambulatory Visit: Payer: Medicaid Other | Admitting: Family Medicine

## 2019-02-20 ENCOUNTER — Encounter: Payer: Self-pay | Admitting: Infectious Diseases

## 2019-02-20 ENCOUNTER — Ambulatory Visit (INDEPENDENT_AMBULATORY_CARE_PROVIDER_SITE_OTHER): Payer: Self-pay | Admitting: Infectious Diseases

## 2019-02-20 ENCOUNTER — Other Ambulatory Visit: Payer: Self-pay

## 2019-02-20 ENCOUNTER — Telehealth: Payer: Self-pay | Admitting: Pharmacy Technician

## 2019-02-20 VITALS — BP 107/74 | HR 73 | Temp 98.5°F | Ht 59.0 in | Wt 129.0 lb

## 2019-02-20 DIAGNOSIS — F319 Bipolar disorder, unspecified: Secondary | ICD-10-CM | POA: Insufficient documentation

## 2019-02-20 DIAGNOSIS — F1110 Opioid abuse, uncomplicated: Secondary | ICD-10-CM

## 2019-02-20 DIAGNOSIS — F3160 Bipolar disorder, current episode mixed, unspecified: Secondary | ICD-10-CM

## 2019-02-20 DIAGNOSIS — B182 Chronic viral hepatitis C: Secondary | ICD-10-CM

## 2019-02-20 NOTE — Progress Notes (Signed)
Amy Walls  [id number]  July 09, 1981    HPI: The patient is a 38 y.o. y/o white female who presents today for an evaluation for HCV. She was initially diagnosed with HCV in 2014. Testing performed at the [facility] in 4/19 showed a positive HCV qualitative viral load. She has known infection with genotype 3 as of 2017, as prior care was at [facility]. Pt is HCV tx naive, however. Her last heroin use was 1 week ago as she reports relapsing following the death of a family member. She last attended rehab for 10 days in April 2020. Despite her ongoing IVDU, she believes she was infected following using a razor she used to shave her legs that earlier an HCV+ friend used to shave his head. She denies any tattoos, blood transfusions, or body peircings beyond having her ears peirced as a child. She was negative for HIV in 2017 and claims she had a repeat test performed 2 weeks ago but these results are unavailable to me today. She has never had HCV staging performed. She admits to receiving active treatment at a local methadone clinic but does not know her dose. She also reports several other medications which she is uncertain of name or dosing, so we could not confirm much of her med list at today's visit. She is without acute complaints today but is anxious to start DAA tx for HCV.  Past Medical History:  Diagnosis Date  . ADHD (attention deficit hyperactivity disorder)   . Anxiety   . Arthritis    "inflammation of all my joints"  . Asthma   . Bowel obstruction ([place]) 2007   "upper bowel"  . Chronic bronchitis    "yearly"  . Chronic mid back pain   . Complication of anesthesia 2007   "lost me on the table when I had upper bowel obstruction"  . Constipation, chronic   . Depression   . Fibromyalgia   . Hepatitis C   . Heroin abuse ([place])   . Hypoglycemia    "my sugar runs low alot"  . IV drug user   . Kidney infection    "often"  . Migraines 12/28/11   "@ least once/wk"  . PONV  (postoperative nausea and vomiting)   . Recurrent UTI     Past Surgical History:  Procedure Laterality Date  . BOWEL RESECTION  04/2006   "upper bowel"  . OVARY SURGERY  ~ 2002   "had 10 inches blood built up; they opened me up and got the blood out"     Family History  Problem Relation Age of Onset  . Breast cancer Maternal Grandmother   . Colon cancer Maternal Grandmother   . Irritable bowel syndrome Maternal Grandmother   . Diabetes Mother   . Heart disease Father   . Cancer Maternal Aunt        colon cancer   . Vision loss Maternal Grandfather      Social History   Tobacco Use  . Smoking status: Current Every Day Smoker    Packs/day: 0.50    Years: 20.00    Pack years: 10.00    Types: Cigarettes  . Smokeless tobacco: Never Used  Substance Use Topics  . Alcohol use: Not Currently    Comment: only social alcohol intake  . Drug use: Yes    Types: Cocaine, Heroin, Other-see comments    Comment: last heroin use was mid June 2020, has abused prescription narcotics in the past  reports being sexually active. She reports using the following methods of birth control/protection: Implant and Condom. Reports always using condoms. 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 Began abusing prescription narcotics as early as 2002 and by 2008 had graduated to daily heroin use. She has maintained sobreity for 3 months at the longest interval in the past.   Allergies  Allergen Reactions  . Cinnamon Anaphylaxis    "swells up airways"  . Levaquin [Levofloxacin] Other (See Comments)    "burns my veins when they put it thru the IV; like veins are on fire"  . Morphine And Related Nausea And Vomiting  . Penicillins Anaphylaxis    Has patient had a PCN reaction causing immediate rash, facial/tongue/throat swelling, SOB or lightheadedness with hypotension: Yes Has patient had a PCN reaction causing severe rash involving mucus membranes or skin necrosis: No Has patient had a PCN reaction  that required hospitalization No Has patient had a PCN reaction occurring within the last 10 years: Yes If all of the above answers are "NO", then may proceed with Cephalosporin use.  "swells up my airways"  . Acetaminophen     Liver issues  . Sulfa Antibiotics Nausea And Vomiting     Outpatient Medications Prior to Visit  Medication Sig Dispense Refill  . divalproex (DEPAKOTE) 500 MG DR tablet Take 500 mg by mouth 2 (two) times daily.    Marland Kitchen. etonogestrel (IMPLANON) 68 MG IMPL implant 1 each by Subdermal route once.    . lamoTRIgine (LAMICTAL) 100 MG tablet Take 100 mg by mouth 2 (two) times daily.    . Melatonin 1 MG CAPS Take 3 mg by mouth.    . methocarbamol (ROBAXIN) 750 MG tablet Take 750 mg by mouth 2 (two) times a day.    . pantoprazole (PROTONIX) 20 MG tablet Take 1 tablet (20 mg total) by mouth daily. 30 tablet 0   No facility-administered medications prior to visit.      Review of Systems  Constitutional: Positive for fatigue. Negative for chills and fever.  HENT: Negative for congestion, hearing loss and sinus pressure.   Eyes: Negative for photophobia, discharge, redness and visual disturbance.  Respiratory: Negative for apnea, cough, shortness of breath and wheezing.   Cardiovascular: Negative for chest pain and leg swelling.  Gastrointestinal: Negative for abdominal distention, abdominal pain, constipation, diarrhea, nausea and vomiting.  Endocrine: Negative for cold intolerance, heat intolerance, polydipsia and polyuria.  Genitourinary: Negative for dysuria, flank pain, frequency, urgency, vaginal bleeding and vaginal discharge.  Musculoskeletal: Positive for arthralgias and myalgias. Negative for back pain, joint swelling and neck pain.  Skin: Negative for pallor and rash.  Allergic/Immunologic: Negative for immunocompromised state.  Neurological: Negative for dizziness, seizures, speech difficulty, weakness and headaches.  Hematological: Does not bruise/bleed easily.   Psychiatric/Behavioral: Negative for agitation, confusion, hallucinations and sleep disturbance. The patient is not nervous/anxious.      Vitals:   02/20/19 1003  BP: 107/74  Pulse: 73  Temp: 98.5 F (36.9 C)     Physical Exam Gen: extremely anxious, NAD, A&Ox 3 Head: NCAT, no temporal wasting evident EENT: PERRL, EOMI, MMM, adequate dentition Neck: supple, no JVD CV: NRRR, no murmurs evident Pulm: CTA bilaterally, mild expiratory wheeze, no retractions Abd: soft, NTND, +BS Extrems: trace LE edema, 2+ pulses Skin: no rashes, adequate skin turgor, multiple healed track marks to her arms Neuro: +anxious with pressured speech, CN II-XII grossly intact, no focal neurologic deficits appreciated, gait was normal, A&Ox 3   Labs: Lab Results  Component Value Date   HEPBSAG NEGATIVE 09/02/2014    Lab Results  Component Value Date   HCVRNAPCRQN 735,000 (H) 02/20/2019    Lab Results  Component Value Date   FIBROSTAGE F0 02/20/2019    No results found for: HCVGENOTYPE  Lab Results  Component Value Date   WBC 7.1 07/24/2018   HGB 14.4 07/24/2018   HCT 43.8 07/24/2018   MCV 91.8 07/24/2018   PLT 277 07/24/2018       Chemistry      Component Value Date/Time   NA 138 07/24/2018 0918   K 3.8 07/24/2018 0918   CL 105 07/24/2018 0918   CO2 26 07/24/2018 0918   BUN 7 07/24/2018 0918   CREATININE 0.77 07/24/2018 0918   CREATININE 0.77 09/02/2014 1629      Component Value Date/Time   CALCIUM 9.5 07/24/2018 0918   ALKPHOS 68 07/24/2018 0918   AST 158 (H) 07/24/2018 0918   ALT 69 (H) 02/20/2019 1103   BILITOT 0.9 07/24/2018 [id number]        Assessment/Plan: Patient is a 38 year old white female with a history of IV heroin use, bowel obstruction secondary to narcotic ileus, ADHD, and chronic headaches presenting for evaluation for hepatitis C infection.  HCV - HCV Ab was positive initially in 2014, thus confirming past exposure to pathogen. Note: this screening test will  remain positive/reactive life-long even if HCV infection has been cured/immunologically cleared. Most likely risk factor for acquisition was past IV drug use, intranasal cocaine use, and less likely tattoo placement. Repeat/update her HCV viral load as it is already known she has chronic HCV infection with genotype 3 [facility] She reports multiple negative HIV Ab, thus excluding co-infection as well. As chronic infection has been confirmed, will need to proceed with FibroSURE testing to determine stage of fibrosis and best directly active antiviral treatment options. F/u in 6 weeks to review lab results.   Bipolar disorder -this is quite difficult to separate from the patient's active substance abuse issues.  Prior to initiation of treatment, at minimum our office must have a list of medications the patient is taking to assess for interactions with potential hepatitis C treatment.  As she has genotype 3 which has a lower cure rate, we will need to ensure that she understands the risk of headaches as a side effect of medication which may compound on her known history of migraines.  Preferably, I would like to see the patient without any active substance use for 3 to 6 months prior to initiation of treatment in order to prevent reinfection.  Efforts to impress this upon the patient were rather limited as her insight appears poor at the present time.  Health maintenance -  I have counselled the patient extensively re: the need for barrier precautions with sexual activity and possibly even contraceptive methods (IUD vs. OCPs) in order to prevent sexual transmission and unwanted pregnancy and thus vertical transmission as HCV tx is currently contra-indicated in pregnancy secondary to unknown gestational effects of meds. She must continue these practices until 3 months after treatment completion until a sustained virologic response (SVR) has been confirmed to establish a cure of her  infection. She expressed full understanding of these instructions. Vaccination for hepatitis A & B was recommended as was abstinence from cigarettes.  It was advised that she abstain from all illicit drugs as this is the most likely method of acquisition for hepatitis C.

## 2019-02-20 NOTE — Patient Instructions (Signed)
Abstain from all illicit drugs and alcohol. Return to clinic in 1 month for visit with Dr. Prince Rome. Please bring a medication list with names of any medications you actively take, dosage, and prescriber listed with you to next visit.

## 2019-02-20 NOTE — Telephone Encounter (Addendum)
[facility] Patient Advocate Encounter    Findings of the benefits investigation conducted this morning via test claims for the patient's upcoming appointment on 02/20/2019 are as follows:   Insurance: no coverage found, patient confirmed that she has no coverage. She is currently applying for disability but not medicaid  Patient filled out both applications for assistance and will submit in 4 weeks when she comes for her follow-up appointment. 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   Bartholomew Crews, CPhT Specialty Pharmacy Patient [facility] Phone: [phone] Fax: [phone] 02/20/2019 8:23 AM

## 2019-03-01 LAB — LIVER FIBROSIS, FIBROTEST-ACTITEST
ALT: 69 U/L — ABNORMAL HIGH (ref 6–29)
Alpha-2-Macroglobulin: 121 mg/dL (ref 106–279)
Apolipoprotein A1: 152 mg/dL (ref 101–198)
Bilirubin: 0.9 mg/dL (ref 0.2–1.2)
Fibrosis Score: 0.11
GGT: 77 U/L — ABNORMAL HIGH (ref 3–50)
Haptoglobin: 131 mg/dL (ref 43–212)
Necroinflammat ACT Score: 0.34
Reference ID: 2996034

## 2019-03-01 LAB — HEPATITIS C RNA QUANTITATIVE
HCV Quantitative Log: 5.87 Log IU/mL — ABNORMAL HIGH
HCV RNA, PCR, QN: 735000 IU/mL — ABNORMAL HIGH

## 2019-03-27 ENCOUNTER — Ambulatory Visit: Payer: Medicaid Other | Admitting: Infectious Diseases

## 2019-03-28 ENCOUNTER — Ambulatory Visit: Payer: Medicaid Other | Admitting: Infectious Diseases

## 2019-03-29 ENCOUNTER — Ambulatory Visit (INDEPENDENT_AMBULATORY_CARE_PROVIDER_SITE_OTHER): Payer: Self-pay | Admitting: Infectious Diseases

## 2019-03-29 ENCOUNTER — Encounter: Payer: Self-pay | Admitting: Infectious Diseases

## 2019-03-29 ENCOUNTER — Other Ambulatory Visit: Payer: Self-pay

## 2019-03-29 VITALS — BP 111/78 | HR 90 | Temp 98.1°F

## 2019-03-29 DIAGNOSIS — B182 Chronic viral hepatitis C: Secondary | ICD-10-CM

## 2019-03-29 DIAGNOSIS — F1110 Opioid abuse, uncomplicated: Secondary | ICD-10-CM

## 2019-03-29 DIAGNOSIS — F319 Bipolar disorder, unspecified: Secondary | ICD-10-CM

## 2019-03-29 NOTE — Progress Notes (Signed)
Amy CatenaDana Walls  [id number]  1981-05-20    HPI: The patient is a 38 y.o. y/o white female who presents today for a routine return visit for HCV.  She was last seen in our clinic on February 20, 2019.  The pt was diagnosed with HCV in 2014. Testing performed [facility] in 4/19 showed a positive HCV qualitative viral load. She has known infection with genotype 3 as of 2017, as prior care was [facility]. Pt is HCV tx naive. Last heroin use was yesterday, claiming she relapsed following death of another family member. (The patient actually attempts to tell me the same story she did at her last visit).  She last attended rehab for 10 days in April 2020 but states she is planning to go again tomorrow to start another rehab program given her continued +IVDU. She continues to state she "knows" she was infected following a friend used to shave his head that she used to shave her legs later though.  Her insight into the linkage between her hepatitis C and her active IV drug use remains poor.  She denies any tattoos, blood transfusions, or body peircings beyond having her ears peirced as a child. She was negative for HIV in 2017 and claims she had a repeat test performed 2 months ago but these results are unavailable to me today.  Her HCV viral load was 735,000 IU at her last visit and FibroSure testing showed F0 fibrosis. She claims she is receiving active treatment at a local methadone clinic but does not know her dose. She also reports several other medications which she is uncertain of name or dosing, so we could not confirm much of her med list at today's visit. She is without acute complaints today but remains anxious to start DAA tx for HCV.  Past Medical History:  Diagnosis Date  . ADHD (attention deficit hyperactivity disorder)   . Anxiety   . Arthritis    "inflammation of all my joints"  . Asthma   . Bowel obstruction (HCC) 2007   "upper bowel"  . Chronic bronchitis    "yearly"  .  Chronic mid back pain   . Complication of anesthesia 2007   "lost me on the table when I had upper bowel obstruction"  . Constipation, chronic   . Depression   . Fibromyalgia   . Hepatitis C   . Heroin abuse (HCC)   . Hypoglycemia    "my sugar runs low alot"  . IV drug user   . Kidney infection    "often"  . Migraines 12/28/11   "@ least once/wk"  . PONV (postoperative nausea and vomiting)   . Recurrent UTI     Past Surgical History:  Procedure Laterality Date  . BOWEL RESECTION  04/2006   "upper bowel"  . OVARY SURGERY  ~ 2002   "had 10 inches blood built up; they opened me up and got the blood out"     Family History  Problem Relation Age of Onset  . Breast cancer Maternal Grandmother   . Colon cancer Maternal Grandmother   . Irritable bowel syndrome Maternal Grandmother   . Diabetes Mother   . Heart disease Father   . Cancer Maternal Aunt        colon cancer   . Vision loss Maternal Grandfather      Social History   Tobacco Use  . Smoking status: Current Every Day Smoker    Packs/day: 0.50  Years: 20.00    Pack years: 10.00    Types: Cigarettes  . Smokeless tobacco: Never Used  Substance Use Topics  . Alcohol use: Not Currently    Comment: only social alcohol intake  . Drug use: Yes    Types: Cocaine, Heroin, Other-see comments    Comment: last heroin use was mid June 2020, has abused prescription narcotics in the past      reports being sexually active. She reports using the following methods of birth control/protection: Implant and Condom. Reports always using condoms. 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 Began abusing prescription narcotics as early as 2002 and by 2008 had graduated to daily heroin use. She has maintained sobreity for 3 months at the longest interval   Allergies  Allergen Reactions  . Cinnamon Anaphylaxis    "swells up airways"  . Levaquin [Levofloxacin] Other (See Comments)    "burns my veins when they put it thru the IV; like  veins are on fire"  . Morphine And Related Nausea And Vomiting  . Penicillins Anaphylaxis    Has patient had a PCN reaction causing immediate rash, facial/tongue/throat swelling, SOB or lightheadedness with hypotension: Yes Has patient had a PCN reaction causing severe rash involving mucus membranes or skin necrosis: No Has patient had a PCN reaction that required hospitalization No Has patient had a PCN reaction occurring within the last 10 years: Yes If all of the above answers are "NO", then may proceed with Cephalosporin use.  "swells up my airways"  . Acetaminophen     Liver issues  . Sulfa Antibiotics Nausea And Vomiting     Outpatient Medications Prior to Visit  Medication Sig Dispense Refill  . divalproex (DEPAKOTE) 500 MG DR tablet Take 500 mg by mouth 2 (two) times daily.    Marland Kitchen. etonogestrel (IMPLANON) 68 MG IMPL implant 1 each by Subdermal route once.    . lamoTRIgine (LAMICTAL) 100 MG tablet Take 100 mg by mouth 2 (two) times daily.    . Melatonin 1 MG CAPS Take 3 mg by mouth.    . methocarbamol (ROBAXIN) 750 MG tablet Take 750 mg by mouth 2 (two) times a day.    . pantoprazole (PROTONIX) 20 MG tablet Take 1 tablet (20 mg total) by mouth daily. 30 tablet 0   No facility-administered medications prior to visit.      Review of Systems  Constitutional: Negative for chills, fatigue and fever.  HENT: Negative for congestion, hearing loss and sinus pressure.   Eyes: Negative for photophobia, discharge, redness and visual disturbance.  Respiratory: Negative for apnea, cough, shortness of breath and wheezing.   Cardiovascular: Negative for chest pain and leg swelling.  Gastrointestinal: Negative for abdominal distention, abdominal pain, constipation, diarrhea, nausea and vomiting.  Endocrine: Negative for cold intolerance, heat intolerance, polydipsia and polyuria.  Genitourinary: Negative for dysuria, flank pain, frequency, urgency, vaginal bleeding and vaginal discharge.   Musculoskeletal: Negative for arthralgias, back pain, joint swelling and neck pain.  Skin: Negative for pallor and rash.  Allergic/Immunologic: Negative for immunocompromised state.  Neurological: Negative for dizziness, seizures, speech difficulty, weakness and headaches.  Hematological: Does not bruise/bleed easily.  Psychiatric/Behavioral: Negative for agitation, confusion, hallucinations and sleep disturbance. The patient is not nervous/anxious.      Vitals:   03/29/19 0958  BP: 111/78  Pulse: 90  Temp: 98.1 F (36.7 C)     Physical Exam Gen: extremely anxious, NAD, A&Ox 3 Head: NCAT, no temporal wasting evident EENT: PERRL, EOMI, MMM, adequate dentition  Neck: supple, no JVD CV: NRRR, no murmurs evident Pulm: CTA bilaterally, mild expiratory wheeze, no retractions Abd: soft, NTND, +BS Extrems: trace LE edema, 2+ pulses Skin: no rashes, adequate skin turgor, multiple healed track marks to her arms Neuro: +anxious with pressured speech, CN II-XII grossly intact, no focal neurologic deficits appreciated, gait was normal, A&Ox 3   Labs: Lab Results  Component Value Date   HEPBSAG NEGATIVE 09/02/2014    Lab Results  Component Value Date   HCVRNAPCRQN 735,000 (H) 02/20/2019    Lab Results  Component Value Date   FIBROSTAGE F0 02/20/2019    No results found for: HCVGENOTYPE  Lab Results  Component Value Date   WBC 7.1 07/24/2018   HGB 14.4 07/24/2018   HCT 43.8 07/24/2018   MCV 91.8 07/24/2018   PLT 277 07/24/2018       Chemistry      Component Value Date/Time   NA 138 07/24/2018 0918   K 3.8 07/24/2018 0918   CL 105 07/24/2018 0918   CO2 26 07/24/2018 0918   BUN 7 07/24/2018 0918   CREATININE 0.77 07/24/2018 0918   CREATININE 0.77 09/02/2014 1629      Component Value Date/Time   CALCIUM 9.5 07/24/2018 0918   ALKPHOS 68 07/24/2018 0918   AST 158 (H) 07/24/2018 0918   ALT 69 (H) 02/20/2019 1103   BILITOT 0.9 07/24/2018 [id number]         Assessment/Plan: Patient is a 38 year old white female with active IV heroin use, h/o bowel obstruction secondary to narcotic ileus, ADHD/bipolar disorder, and chronic headaches presenting for evaluation for hepatitis C infection.  HCV - HCV Ab was positive initially in 2014, thus confirming past exposure to pathogen. Note: this screening test will remain positive/reactive life-long even if HCV infection has been cured/immunologically cleared. Her most likely risk factor for acquisition was past IV drug use, intranasal cocaine use, and less likely tattoo placement. Repeat/updated HCV viral load was 735,000 IU. She has chronic HCV infection with genotype 3 known from prior work [facility].. She reports multiple negative HIV Ab, thus excluding co-infection as well.  Her FibroSure testing for staging showed F0 fibrosis. Given the patient's active IV drug use, lack of clarity on her psychiatric medication regimen, and overall lack of preparedness for adherence, we have elected to postpone initiation of DAA treatment, particularly given the lower rate of cure for genotype 3 infection.  Once she has completed rehab which she plans to start tomorrow, my hope would be is that she will be in a better mindset land ess likely to continue to participate in IV drug use, which has led to her current infection.  It is tantamount that the patient gains insight into the linkage between her IV drug use and hepatitis C infection.  We will reassess the patient in 2 months.  Bipolar disorder - This is quite difficult to separate from the patient's active substance abuse issues.  Prior to initiation of treatment, at minimum our office must have a list of medications the patient is taking to assess for interactions with potential hepatitis C treatment.  As she has genotype 3 which has a lower cure rate, we will need to ensure that she understands the risk of headaches as a side effect of medication which may compound on  her known history of migraines.  Preferably, I would like to see the patient without any active substance use for 3 to 6 months prior to initiation of treatment in  order to prevent reinfection.  Efforts to impress this upon the patient were rather limited as her insight appears poor at the present time.  I have asked the patient to bring a letter from her psychiatric counselor to her next visit, documenting her rehab efforts and their opinion on whether she is prepared for hepatitis C treatment at that juncture.  Health maintenance -  I have counselled the patient extensively re: the need for barrier precautions with sexual activity and possibly even contraceptive methods (IUD vs. OCPs) in order to prevent sexual transmission and unwanted pregnancy and thus vertical transmission as HCV tx is currently contra-indicated in pregnancy secondary to unknown gestational effects of meds. She must continue these practices until 3 months after treatment completion until a sustained virologic response (SVR) has been confirmed to establish a cure of her infection. She expressed full understanding of these instructions. Vaccination for hepatitis A & B was recommended as was abstinence from cigarettes.  It was advised that she abstain from all illicit drugs as this is the most likely method of acquisition for hepatitis C.

## 2019-03-29 NOTE — Patient Instructions (Signed)
Stop using illicit drugs. Attend rehab beginning tomorrow. Bring letter from psychiatric counsellor from rehab, documenting rehab efforts and whether they feel you are prepared for hep C treatment at that time. Return to clinic in 2 months.

## 2019-05-29 ENCOUNTER — Ambulatory Visit: Payer: Medicaid Other | Admitting: Infectious Diseases

## 2019-05-31 ENCOUNTER — Ambulatory Visit: Payer: Medicaid Other | Admitting: Infectious Diseases

## 2019-06-02 ENCOUNTER — Emergency Department (HOSPITAL_BASED_OUTPATIENT_CLINIC_OR_DEPARTMENT_OTHER): Payer: Medicaid Other

## 2019-06-02 ENCOUNTER — Emergency Department (HOSPITAL_BASED_OUTPATIENT_CLINIC_OR_DEPARTMENT_OTHER)
Admission: EM | Admit: 2019-06-02 | Discharge: 2019-06-02 | Disposition: A | Discharge status: Home / Self Care | Payer: Medicaid Other | Attending: Emergency Medicine | Admitting: Emergency Medicine

## 2019-06-02 ENCOUNTER — Other Ambulatory Visit: Payer: Self-pay

## 2019-06-02 ENCOUNTER — Encounter (HOSPITAL_BASED_OUTPATIENT_CLINIC_OR_DEPARTMENT_OTHER): Payer: Self-pay | Admitting: Emergency Medicine

## 2019-06-02 DIAGNOSIS — F1110 Opioid abuse, uncomplicated: Secondary | ICD-10-CM | POA: Insufficient documentation

## 2019-06-02 DIAGNOSIS — F1410 Cocaine abuse, uncomplicated: Secondary | ICD-10-CM | POA: Insufficient documentation

## 2019-06-02 DIAGNOSIS — F17210 Nicotine dependence, cigarettes, uncomplicated: Secondary | ICD-10-CM | POA: Insufficient documentation

## 2019-06-02 DIAGNOSIS — R350 Frequency of micturition: Secondary | ICD-10-CM | POA: Insufficient documentation

## 2019-06-02 DIAGNOSIS — J45909 Unspecified asthma, uncomplicated: Secondary | ICD-10-CM | POA: Insufficient documentation

## 2019-06-02 DIAGNOSIS — Z79899 Other long term (current) drug therapy: Secondary | ICD-10-CM | POA: Insufficient documentation

## 2019-06-02 DIAGNOSIS — N12 Tubulo-interstitial nephritis, not specified as acute or chronic: Secondary | ICD-10-CM

## 2019-06-02 DIAGNOSIS — R8290 Unspecified abnormal findings in urine: Secondary | ICD-10-CM | POA: Insufficient documentation

## 2019-06-02 DIAGNOSIS — R1110 Vomiting, unspecified: Secondary | ICD-10-CM | POA: Insufficient documentation

## 2019-06-02 DIAGNOSIS — R300 Dysuria: Secondary | ICD-10-CM | POA: Insufficient documentation

## 2019-06-02 LAB — COMPREHENSIVE METABOLIC PANEL
ALT: 123 U/L — ABNORMAL HIGH (ref 0–44)
AST: 194 U/L — ABNORMAL HIGH (ref 15–41)
Albumin: 3.7 g/dL (ref 3.5–5.0)
Alkaline Phosphatase: 84 U/L (ref 38–126)
Anion gap: 8 (ref 5–15)
BUN: 17 mg/dL (ref 6–20)
CO2: 24 mmol/L (ref 22–32)
Calcium: 9 mg/dL (ref 8.9–10.3)
Chloride: 104 mmol/L (ref 98–111)
Creatinine, Ser: 0.93 mg/dL (ref 0.44–1.00)
GFR calc Af Amer: >60 mL/min (ref >60)
GFR calc non Af Amer: >60 mL/min (ref >60)
Glucose, Bld: 93 mg/dL (ref 70–99)
Potassium: 4.4 mmol/L (ref 3.5–5.1)
Sodium: 136 mmol/L (ref 135–145)
Total Bilirubin: 0.7 mg/dL (ref 0.3–1.2)
Total Protein: 7.1 g/dL (ref 6.5–8.1)

## 2019-06-02 LAB — CBC WITH DIFFERENTIAL/PLATELET
Abs Immature Granulocytes: 0.01 10*3/uL (ref 0.00–0.07)
Basophils Absolute: 0 10*3/uL (ref 0.0–0.1)
Basophils Relative: 1 %
Eosinophils Absolute: 0.1 10*3/uL (ref 0.0–0.5)
Eosinophils Relative: 2 %
HCT: 44.5 % (ref 36.0–46.0)
Hemoglobin: 14.7 g/dL (ref 12.0–15.0)
Immature Granulocytes: 0 %
Lymphocytes Relative: 42 %
Lymphs Abs: 3.4 10*3/uL (ref 0.7–4.0)
MCH: 30 pg (ref 26.0–34.0)
MCHC: 33 g/dL (ref 30.0–36.0)
MCV: 90.8 fL (ref 80.0–100.0)
Monocytes Absolute: 0.6 10*3/uL (ref 0.1–1.0)
Monocytes Relative: 8 %
Neutro Abs: 3.8 10*3/uL (ref 1.7–7.7)
Neutrophils Relative %: 47 %
Platelets: 273 10*3/uL (ref 150–400)
RBC: 4.9 MIL/uL (ref 3.87–5.11)
RDW: 11.9 % (ref 11.5–15.5)
WBC: 7.9 10*3/uL (ref 4.0–10.5)
nRBC: 0 % (ref 0.0–0.2)

## 2019-06-02 LAB — URINALYSIS, ROUTINE W REFLEX MICROSCOPIC
Bilirubin Urine: NEGATIVE
Glucose, UA: NEGATIVE mg/dL
Hgb urine dipstick: NEGATIVE
Ketones, ur: NEGATIVE mg/dL
Leukocytes,Ua: NEGATIVE
Nitrite: POSITIVE — AB
Protein, ur: NEGATIVE mg/dL
Specific Gravity, Urine: 1.02 (ref 1.005–1.030)
pH: 6.5 (ref 5.0–8.0)

## 2019-06-02 LAB — URINALYSIS, MICROSCOPIC (REFLEX)

## 2019-06-02 LAB — PREGNANCY, URINE: Preg Test, Ur: NEGATIVE

## 2019-06-02 MED ORDER — KETOROLAC TROMETHAMINE 30 MG/ML IJ SOLN
30.0000 mg | Freq: Once | INTRAMUSCULAR | Status: AC
  Administered 2019-06-02: 30 mg via INTRAVENOUS
  Filled 2019-06-02: qty 1

## 2019-06-02 MED ORDER — CIPROFLOXACIN HCL 500 MG PO TABS: 500.0000 mg | ORAL_TABLET | Freq: Two times a day (BID) | ORAL | 0 refills | Status: AC

## 2019-06-02 MED ORDER — SODIUM CHLORIDE 0.9 % IV BOLUS
1000.0000 mL | Freq: Once | INTRAVENOUS | Status: AC
  Administered 2019-06-02: 10:00:00 1000 mL via INTRAVENOUS

## 2019-06-02 NOTE — ED Provider Notes (Signed)
[facility] EMERGENCY DEPARTMENT Provider Note   CSN: [id number] Arrival date & time: 06/02/19  3664     History   Chief Complaint Chief Complaint  Patient presents with  . Back Pain    HPI Amy Walls is a 38 y.o. female with past medical history notable for recurrent UTIs, IVDA, and tobacco use who presents to the ED with a 3-day history of right lower back pain, vomiting, dysuria, and increased urinary frequency.  Patient states that she has had a foul odor with her urine x3 days and atraumatic right low back pain worse with urination.  Currently her flank discomfort is 7 out of 10 described as "sharp".  She admits to 3 episodes of nonbilious, nonbloody vomiting over the course of the duration of her illness.  She had normal bowel movement last night and is passing gas.  Last IV drug use was approximately 6 months ago.  She has not taken any for her symptoms.  She denies any fevers, chills, chest pain, shortness of breath, weakness, saddle anesthesia, midline spinal tenderness, incontinence, numbness, or other symptoms.     HPI  Past Medical History:  Diagnosis Date  . ADHD (attention deficit hyperactivity disorder)   . Anxiety   . Arthritis    "inflammation of all my joints"  . Asthma   . Bowel obstruction ([place]) 2007   "upper bowel"  . Chronic bronchitis    "yearly"  . Chronic mid back pain   . Complication of anesthesia 2007   "lost me on the table when I had upper bowel obstruction"  . Constipation, chronic   . Depression   . Fibromyalgia   . Hepatitis C   . Heroin abuse ([place])   . Hypoglycemia    "my sugar runs low alot"  . IV drug user   . Kidney infection    "often"  . Migraines 12/28/11   "@ least once/wk"  . PONV (postoperative nausea and vomiting)   . Recurrent UTI     Patient Active Problem List   Diagnosis Date Noted  . Bipolar disorder ([place]) 02/20/2019  . Nausea vomiting and diarrhea 08/29/2014  . Intractable vomiting 08/29/2014  .  Diarrhea 08/29/2014  . Chronic hepatitis C without hepatic coma ([place]) 2014  . Intussusception ([place]) 01/01/2012  . Acute back pain 12/28/2011  . Abdominal pain 12/28/2011  . Transaminitis 12/28/2011  . Asthma 12/28/2011  . IBS (irritable bowel syndrome) 12/28/2011  . Tobacco abuse 12/28/2011    Past Surgical History:  Procedure Laterality Date  . BOWEL RESECTION  04/2006   "upper bowel"  . OVARY SURGERY  ~ 2002   "had 10 inches blood built up; they opened me up and got the blood out"     OB History   No obstetric history on file.      Home Medications    Prior to Admission medications   Medication Sig Start Date End Date Taking? Authorizing Provider  divalproex (DEPAKOTE) 500 MG DR tablet Take 500 mg by mouth 2 (two) times daily.   Yes [provider]  lamoTRIgine (LAMICTAL) 100 MG tablet Take 100 mg by mouth 2 (two) times daily.   Yes [provider]  Melatonin 1 MG CAPS Take 3 mg by mouth.   Yes [provider]  pantoprazole (PROTONIX) 20 MG tablet Take 1 tablet (20 mg total) by mouth daily. 07/24/18 06/02/19 Yes Curatolo, Adam, DO  ciprofloxacin (CIPRO) 500 MG tablet Take 1 tablet (500 mg total) by mouth  2 (two) times daily for 7 days. 06/02/19 06/09/19  Lorelee NewGreen, Joeann Steppe L, PA-C  etonogestrel (IMPLANON) 68 MG IMPL implant 1 each by Subdermal route once.    [provider]    Family History Family History  Problem Relation Age of Onset  . Breast cancer Maternal Grandmother   . Colon cancer Maternal Grandmother   . Irritable bowel syndrome Maternal Grandmother   . Diabetes Mother   . Heart disease Father   . Cancer Maternal Aunt        colon cancer   . Vision loss Maternal Grandfather     Social History Social History   Tobacco Use  . Smoking status: Current Every Day Smoker    Packs/day: 0.50    Years: 20.00    Pack years: 10.00    Types: Cigarettes  . Smokeless tobacco: Never Used  Substance Use Topics  . Alcohol use: Not  Currently    Comment: only social alcohol intake  . Drug use: Yes    Types: Cocaine, Heroin, Other-see comments    Comment: last heroin use was mid June 2020, has abused prescription narcotics in the past     Allergies   Cinnamon, Levaquin [levofloxacin], Morphine and related, Penicillins, Acetaminophen, and Sulfa antibiotics   Review of Systems Review of Systems  All other systems reviewed and are negative.    Physical Exam Updated Vital Signs BP 100/70 (BP Location: Right Arm)   Pulse (!) 51   Temp 98.2 F (36.8 C) (Oral)   Resp 16   Ht 4\' 10"  (1.473 m)   Wt 56.7 kg   SpO2 96%   BMI 26.13 kg/m   Physical Exam Vitals signs and nursing note reviewed. Exam conducted with a chaperone present.  Constitutional:      Appearance: Normal appearance.  HENT:     Head: Normocephalic and atraumatic.  Eyes:     General: No scleral icterus.    Conjunctiva/sclera: Conjunctivae normal.  Cardiovascular:     Rate and Rhythm: Normal rate and regular rhythm.     Pulses: Normal pulses.  Pulmonary:     Effort: Pulmonary effort is normal. No respiratory distress.     Breath sounds: Normal breath sounds.  Abdominal:     General: Abdomen is flat. There is no distension.     Palpations: Abdomen is soft.     Tenderness: There is no abdominal tenderness. There is right CVA tenderness. There is no guarding.  Musculoskeletal:     Comments: No midline spinal TTP.   Skin:    General: Skin is dry.     Comments: No overlying erythema or swelling.  Neurological:     General: No focal deficit present.     Mental Status: She is alert.     GCS: GCS eye subscore is 4. GCS verbal subscore is 5. GCS motor subscore is 6.     Cranial Nerves: No cranial nerve deficit.     Gait: Gait normal.  Psychiatric:        Mood and Affect: Mood normal.        Behavior: Behavior normal.        Thought Content: Thought content normal.      ED Treatments / Results  Labs (all labs ordered are listed, but  only abnormal results are displayed) Labs Reviewed  URINALYSIS, ROUTINE W REFLEX MICROSCOPIC - Abnormal; Notable for the following components:      Result Value   APPearance CLOUDY (*)    Nitrite POSITIVE (*)  All other components within normal limits  COMPREHENSIVE METABOLIC PANEL - Abnormal; Notable for the following components:   AST 194 (*)    ALT 123 (*)    All other components within normal limits  URINALYSIS, MICROSCOPIC (REFLEX) - Abnormal; Notable for the following components:   Bacteria, UA MANY (*)    All other components within normal limits  PREGNANCY, URINE  CBC WITH DIFFERENTIAL/PLATELET    EKG None  Radiology Ct Renal Stone Study  Result Date: 06/02/2019 CLINICAL DATA:  Right flank pain. Dysuria for 3 days. EXAM: CT ABDOMEN AND PELVIS WITHOUT CONTRAST TECHNIQUE: Multidetector CT imaging of the abdomen and pelvis was performed following the standard protocol without IV contrast. COMPARISON:  07/24/2018 FINDINGS: Lower chest: Clear lung bases. Normal heart size without pericardial or pleural effusion. Hepatobiliary: Normal liver. Normal gallbladder, without biliary ductal dilatation. Pancreas: Normal, without mass or ductal dilatation. Spleen: Normal in size, without focal abnormality. Adrenals/Urinary Tract: Normal adrenal glands. No renal calculi or hydronephrosis. No hydroureter or ureteric calculi. No bladder calculi. Stomach/Bowel: Normal stomach, without wall thickening. Colonic stool burden suggests constipation. Normal terminal ileum and appendix. Normal small bowel. Vascular/Lymphatic: Normal caliber of the aorta and branch vessels. No abdominopelvic adenopathy. Reproductive: Normal uterus and adnexa. Other: No significant free fluid. Musculoskeletal: No acute osseous abnormality. IMPRESSION: 1. No urinary tract calculi or hydronephrosis. 2. Possible constipation. Electronically Signed   By: Jeronimo Greaves M.D.   On: 06/02/2019 11:22    Procedures Procedures  (including critical care time)  Medications Ordered in ED Medications  sodium chloride 0.9 % bolus 1,000 mL (0 mLs Intravenous Stopped 06/02/19 1144)  ketorolac (TORADOL) 30 MG/ML injection 30 mg (30 mg Intravenous Given 06/02/19 1008)     Initial Impression / Assessment and Plan / ED Course  I have reviewed the triage vital signs and the nursing notes.  Pertinent labs & imaging results that were available during my care of the patient were reviewed by me and considered in my medical decision making (see chart for details).        Provide patient with Toradol for pain relief as well as a liter of NS.  While patient endorses IVDA approximately 6 months ago, she denies any fevers, neurologic symptoms, or midline spinal tenderness.  Epidural abscess was considered, but do not suspect it at this time. Also considered cauda equina with the low back pain, but no saddle anesthesia, neurologic deficit, or incontinence.  Ordered a CT renal stone study given her report of flank pain with associated nausea and vomiting. Reviewed the imaging which demonstrated no evidence of obstructing stone or hydronephrosis at this time.   Her UA was cloudy and nitrite positive. Given her report of malodorous urine, right-sided CVA tenderness, and dysuria, this is likely an uncomplicated pyelonephritis.  She is afebrile with normal white count.  She denies any fevers and chills and otherwise appears healthy.  Will treat outpatient with ciprofloxacin 500 mg twice daily x7 days and encourage increased oral hydration.  Return to clinic should she develop any fevers, chills, uncontrolled nausea vomiting, or any other new or worsening symptoms.    Final Clinical Impressions(s) / ED Diagnoses   Final diagnoses:  Pyelonephritis    ED Discharge Orders         Ordered    ciprofloxacin (CIPRO) 500 MG tablet  2 times daily     06/02/19 1156           Lorelee New, PA-C 06/02/19 1156  Alvira Monday, MD 06/04/19 1445

## 2019-06-02 NOTE — ED Notes (Signed)
ED Provider at bedside. 

## 2019-06-02 NOTE — ED Triage Notes (Signed)
R low back pain with difficulty urinating x 3 days.

## 2019-06-02 NOTE — Discharge Instructions (Signed)
Please take ciprofloxacin, as prescribed.  Strongly encourage increase oral hydration.  Continue take ibuprofen as needed for pain.  Return to clinic should she develop any fevers, chills, uncontrolled nausea vomiting, or any other new or worsening symptoms.

## 2019-06-02 NOTE — ED Notes (Addendum)
2 unsuccessful IV attempts. Patient is a former IV user.

## 2019-06-09 ENCOUNTER — Emergency Department (HOSPITAL_BASED_OUTPATIENT_CLINIC_OR_DEPARTMENT_OTHER)
Admission: EM | Admit: 2019-06-09 | Discharge: 2019-06-09 | Disposition: A | Discharge status: Home / Self Care | Payer: Self-pay | Attending: Emergency Medicine | Admitting: Emergency Medicine

## 2019-06-09 ENCOUNTER — Encounter (HOSPITAL_BASED_OUTPATIENT_CLINIC_OR_DEPARTMENT_OTHER): Payer: Self-pay | Admitting: Adult Health

## 2019-06-09 ENCOUNTER — Emergency Department (HOSPITAL_BASED_OUTPATIENT_CLINIC_OR_DEPARTMENT_OTHER): Payer: Self-pay

## 2019-06-09 ENCOUNTER — Other Ambulatory Visit: Payer: Self-pay

## 2019-06-09 DIAGNOSIS — B349 Viral infection, unspecified: Secondary | ICD-10-CM

## 2019-06-09 DIAGNOSIS — Z20828 Contact with and (suspected) exposure to other viral communicable diseases: Secondary | ICD-10-CM | POA: Insufficient documentation

## 2019-06-09 DIAGNOSIS — Z880 Allergy status to penicillin: Secondary | ICD-10-CM | POA: Insufficient documentation

## 2019-06-09 DIAGNOSIS — Z79899 Other long term (current) drug therapy: Secondary | ICD-10-CM | POA: Insufficient documentation

## 2019-06-09 DIAGNOSIS — F17210 Nicotine dependence, cigarettes, uncomplicated: Secondary | ICD-10-CM | POA: Insufficient documentation

## 2019-06-09 DIAGNOSIS — D72829 Elevated white blood cell count, unspecified: Secondary | ICD-10-CM

## 2019-06-09 DIAGNOSIS — Z882 Allergy status to sulfonamides status: Secondary | ICD-10-CM | POA: Insufficient documentation

## 2019-06-09 DIAGNOSIS — J45909 Unspecified asthma, uncomplicated: Secondary | ICD-10-CM | POA: Insufficient documentation

## 2019-06-09 DIAGNOSIS — Z881 Allergy status to other antibiotic agents status: Secondary | ICD-10-CM | POA: Insufficient documentation

## 2019-06-09 DIAGNOSIS — Z793 Long term (current) use of hormonal contraceptives: Secondary | ICD-10-CM | POA: Insufficient documentation

## 2019-06-09 DIAGNOSIS — Z885 Allergy status to narcotic agent status: Secondary | ICD-10-CM | POA: Insufficient documentation

## 2019-06-09 DIAGNOSIS — R109 Unspecified abdominal pain: Secondary | ICD-10-CM | POA: Insufficient documentation

## 2019-06-09 LAB — CBC WITH DIFFERENTIAL/PLATELET
Abs Immature Granulocytes: 2.26 10*3/uL — ABNORMAL HIGH (ref 0.00–0.07)
Basophils Absolute: 0.1 10*3/uL (ref 0.0–0.1)
Basophils Relative: 0 %
Eosinophils Absolute: 0 10*3/uL (ref 0.0–0.5)
Eosinophils Relative: 0 %
HCT: 44.2 % (ref 36.0–46.0)
Hemoglobin: 14.5 g/dL (ref 12.0–15.0)
Immature Granulocytes: 7 %
Lymphocytes Relative: 10 %
Lymphs Abs: 3 10*3/uL (ref 0.7–4.0)
MCH: 29.8 pg (ref 26.0–34.0)
MCHC: 32.8 g/dL (ref 30.0–36.0)
MCV: 90.9 fL (ref 80.0–100.0)
Monocytes Absolute: 1.5 10*3/uL — ABNORMAL HIGH (ref 0.1–1.0)
Monocytes Relative: 5 %
Neutro Abs: 24.9 10*3/uL — ABNORMAL HIGH (ref 1.7–7.7)
Neutrophils Relative %: 78 %
Platelets: 133 10*3/uL — ABNORMAL LOW (ref 150–400)
RBC: 4.86 MIL/uL (ref 3.87–5.11)
RDW: 12.5 % (ref 11.5–15.5)
WBC Morphology: INCREASED
WBC: 31.9 10*3/uL — ABNORMAL HIGH (ref 4.0–10.5)
nRBC: 0 % (ref 0.0–0.2)

## 2019-06-09 LAB — COMPREHENSIVE METABOLIC PANEL
ALT: 87 U/L — ABNORMAL HIGH (ref 0–44)
AST: 125 U/L — ABNORMAL HIGH (ref 15–41)
Albumin: 3.2 g/dL — ABNORMAL LOW (ref 3.5–5.0)
Alkaline Phosphatase: 102 U/L (ref 38–126)
Anion gap: 8 (ref 5–15)
BUN: 21 mg/dL — ABNORMAL HIGH (ref 6–20)
CO2: 23 mmol/L (ref 22–32)
Calcium: 8.8 mg/dL — ABNORMAL LOW (ref 8.9–10.3)
Chloride: 104 mmol/L (ref 98–111)
Creatinine, Ser: 0.82 mg/dL (ref 0.44–1.00)
GFR calc Af Amer: >60 mL/min (ref >60)
GFR calc non Af Amer: >60 mL/min (ref >60)
Glucose, Bld: 96 mg/dL (ref 70–99)
Potassium: 3.7 mmol/L (ref 3.5–5.1)
Sodium: 135 mmol/L (ref 135–145)
Total Bilirubin: 0.9 mg/dL (ref 0.3–1.2)
Total Protein: 6.8 g/dL (ref 6.5–8.1)

## 2019-06-09 LAB — URINALYSIS, ROUTINE W REFLEX MICROSCOPIC
Bilirubin Urine: NEGATIVE
Glucose, UA: NEGATIVE mg/dL
Ketones, ur: NEGATIVE mg/dL
Leukocytes,Ua: NEGATIVE
Nitrite: NEGATIVE
Protein, ur: NEGATIVE mg/dL
Specific Gravity, Urine: 1.02 (ref 1.005–1.030)
pH: 6.5 (ref 5.0–8.0)

## 2019-06-09 LAB — PREGNANCY, URINE: Preg Test, Ur: NEGATIVE

## 2019-06-09 LAB — URINALYSIS, MICROSCOPIC (REFLEX)

## 2019-06-09 LAB — SEDIMENTATION RATE: Sed Rate: 6 mm/hr (ref 0–22)

## 2019-06-09 LAB — LIPASE, BLOOD: Lipase: 22 U/L (ref 11–51)

## 2019-06-09 LAB — LACTIC ACID, PLASMA: Lactic Acid, Venous: 1.2 mmol/L (ref 0.5–1.9)

## 2019-06-09 LAB — C-REACTIVE PROTEIN: CRP: 10 mg/dL — ABNORMAL HIGH (ref <1.0)

## 2019-06-09 MED ORDER — METOCLOPRAMIDE HCL 5 MG/ML IJ SOLN
10.0000 mg | Freq: Once | INTRAMUSCULAR | Status: AC
  Administered 2019-06-09: 19:00:00 10 mg via INTRAVENOUS
  Filled 2019-06-09: qty 2

## 2019-06-09 MED ORDER — KETOROLAC TROMETHAMINE 30 MG/ML IJ SOLN
30.0000 mg | Freq: Once | INTRAMUSCULAR | Status: AC
  Administered 2019-06-09: 19:00:00 30 mg via INTRAVENOUS
  Filled 2019-06-09: qty 1

## 2019-06-09 MED ORDER — ONDANSETRON HCL 4 MG/2ML IJ SOLN
4.0000 mg | Freq: Once | INTRAMUSCULAR | Status: AC
  Administered 2019-06-09: 16:00:00 4 mg via INTRAVENOUS
  Filled 2019-06-09: qty 2

## 2019-06-09 MED ORDER — DIPHENHYDRAMINE HCL 50 MG/ML IJ SOLN
25.0000 mg | Freq: Once | INTRAMUSCULAR | Status: AC
  Administered 2019-06-09: 19:00:00 25 mg via INTRAVENOUS
  Filled 2019-06-09: qty 1

## 2019-06-09 MED ORDER — SODIUM CHLORIDE 0.9 % IV BOLUS
1000.0000 mL | Freq: Once | INTRAVENOUS | Status: AC
  Administered 2019-06-09: 19:00:00 1000 mL via INTRAVENOUS

## 2019-06-09 MED ORDER — IOHEXOL 300 MG/ML  SOLN
100.0000 mL | Freq: Once | INTRAMUSCULAR | Status: AC | PRN
  Administered 2019-06-09: 100 mL via INTRAVENOUS

## 2019-06-09 MED ORDER — SODIUM CHLORIDE 0.9 % IV BOLUS
1000.0000 mL | Freq: Once | INTRAVENOUS | Status: AC
  Administered 2019-06-09: 16:00:00 1000 mL via INTRAVENOUS

## 2019-06-09 MED ORDER — METRONIDAZOLE 500 MG PO TABS: 500.0000 mg | ORAL_TABLET | Freq: Two times a day (BID) | ORAL | 0 refills | Status: DC

## 2019-06-09 MED ORDER — METRONIDAZOLE 500 MG PO TABS
500.0000 mg | ORAL_TABLET | Freq: Once | ORAL | Status: AC
  Administered 2019-06-09: 20:00:00 500 mg via ORAL
  Filled 2019-06-09: qty 1

## 2019-06-09 MED ORDER — PROMETHAZINE HCL 25 MG PO TABS: 25.0000 mg | ORAL_TABLET | Freq: Four times a day (QID) | ORAL | 0 refills | Status: DC | PRN

## 2019-06-09 NOTE — ED Notes (Signed)
Reports n/v/d since Thursday with body aches, chills, and loss of taste. Denies known covid exposure but states she works for [employer] and is in contact with general public

## 2019-06-09 NOTE — ED Triage Notes (Signed)
Presents with 3 days of generalized body aches, fatigue, headaches, nausea,, loss of taste and not feeling well. She reports a subjective fever. She is currently drinking and tolerating ginger ale.

## 2019-06-09 NOTE — ED Notes (Signed)
Pt made aware of need for urine sample. Ice chips given with verbal approval from [place]

## 2019-06-09 NOTE — ED Notes (Signed)
Patient transported to CT 

## 2019-06-09 NOTE — ED Provider Notes (Signed)
[facility] EMERGENCY DEPARTMENT Provider Note   CSN: [id number] Arrival date & time: 06/09/19  1407     History   Chief Complaint Chief Complaint  Patient presents with   Generalized Body Aches    HPI Amy Walls is a 38 y.o. female husband history of bowel obstruction, depression, fibromyalgia, hepatitis C, polysubstance abuse, IV drug use who presents for evaluation of 3 days of generalized body aches, myalgias, chills, nausea/vomiting, diarrhea, loss of taste.  States she has had multiple episodes of nonbloody, nonbilious vomiting since then.  She is not able to tolerate much p.o.  She states that she noted a fever of 100.33 days ago but has not had any fever since.  She states no known COVID-19 exposure but states she works at [employer] and is concerned about being exposed to public.  She has been wearing a mask.  She reports some mild cough.  She recently was seen for urinary tract infection and states she finished all the antibiotics.  Denies any recent IV drug use.  No chest pain.      The history is provided by the patient.    Past Medical History:  Diagnosis Date   ADHD (attention deficit hyperactivity disorder)    Anxiety    Arthritis    "inflammation of all my joints"   Asthma    Bowel obstruction ([place]) 2007   "upper bowel"   Chronic bronchitis    "yearly"   Chronic mid back pain    Complication of anesthesia 2007   "lost me on the table when I had upper bowel obstruction"   Constipation, chronic    Depression    Fibromyalgia    Hepatitis C    Heroin abuse ([place])    Hypoglycemia    "my sugar runs low alot"   IV drug user    Kidney infection    "often"   Migraines 12/28/11   "@ least once/wk"   PONV (postoperative nausea and vomiting)    Recurrent UTI     Patient Active Problem List   Diagnosis Date Noted   Bipolar disorder ([place]) 02/20/2019   Nausea vomiting and diarrhea 08/29/2014   Intractable vomiting 08/29/2014    Diarrhea 08/29/2014   Chronic hepatitis C without hepatic coma ([place]) 2014   Intussusception ([place]) 01/01/2012   Acute back pain 12/28/2011   Abdominal pain 12/28/2011   Transaminitis 12/28/2011   Asthma 12/28/2011   IBS (irritable bowel syndrome) 12/28/2011   Tobacco abuse 12/28/2011    Past Surgical History:  Procedure Laterality Date   BOWEL RESECTION  04/2006   "upper bowel"   OVARY SURGERY  ~ 2002   "had 10 inches blood built up; they opened me up and got the blood out"     OB History   No obstetric history on file.      Home Medications    Prior to Admission medications   Medication Sig Start Date End Date Taking? Authorizing Provider  etonogestrel (IMPLANON) 68 MG IMPL implant 1 each by Subdermal route once.   Yes [provider]  methadone (DOLOPHINE) 10 MG/ML solution Take 100 mg by mouth daily.   Yes [provider]  ciprofloxacin (CIPRO) 500 MG tablet Take 1 tablet (500 mg total) by mouth 2 (two) times daily for 7 days. 06/02/19 06/09/19  Corena Herter, PA-C  divalproex (DEPAKOTE) 500 MG DR tablet Take 500 mg by mouth 2 (two) times daily.    [provider]  lamoTRIgine (  LAMICTAL) 100 MG tablet Take 100 mg by mouth 2 (two) times daily.    [provider]  Melatonin 1 MG CAPS Take 3 mg by mouth.    [provider]  metroNIDAZOLE (FLAGYL) 500 MG tablet Take 1 tablet (500 mg total) by mouth 2 (two) times daily. 06/09/19   Volanda Napoleon, PA-C  pantoprazole (PROTONIX) 20 MG tablet Take 1 tablet (20 mg total) by mouth daily. 07/24/18 06/02/19  Curatolo, Adam, DO  promethazine (PHENERGAN) 25 MG tablet Take 1 tablet (25 mg total) by mouth every 6 (six) hours as needed for nausea or vomiting. 06/09/19   Volanda Napoleon, PA-C    Family History Family History  Problem Relation Age of Onset   Breast cancer Maternal Grandmother    Colon cancer Maternal Grandmother    Irritable bowel syndrome Maternal  Grandmother    Diabetes Mother    Heart disease Father    Cancer Maternal Aunt        colon cancer    Vision loss Maternal Grandfather     Social History Social History   Tobacco Use   Smoking status: Current Every Day Smoker    Packs/day: 0.50    Years: 20.00    Pack years: 10.00    Types: Cigarettes   Smokeless tobacco: Never Used  Substance Use Topics   Alcohol use: Not Currently    Comment: only social alcohol intake   Drug use: Not Currently    Types: Cocaine, Heroin, Other-see comments    Comment: states not currrently using, she is on methadone     Allergies   Cinnamon, Levaquin [levofloxacin], Morphine and related, Penicillins, Acetaminophen, and Sulfa antibiotics   Review of Systems Review of Systems  Constitutional: Negative for fever.  Respiratory: Positive for cough. Negative for shortness of breath.   Cardiovascular: Negative for chest pain.  Gastrointestinal: Positive for diarrhea, nausea and vomiting. Negative for abdominal pain.  Genitourinary: Negative for dysuria and hematuria.  Musculoskeletal: Positive for myalgias.  Neurological: Negative for headaches.  All other systems reviewed and are negative.    Physical Exam Updated Vital Signs BP 113/84 (BP Location: Right Arm)    Pulse 67    Temp 98.4 F (36.9 C) (Oral)    Resp 16    Ht '4\' 10"'  (1.473 m)    Wt 56.7 kg    SpO2 97%    BMI 26.13 kg/m   Physical Exam Vitals signs and nursing note reviewed.  Constitutional:      Appearance: Normal appearance. She is well-developed.  HENT:     Head: Normocephalic and atraumatic.  Eyes:     General: Lids are normal.     Conjunctiva/sclera: Conjunctivae normal.     Pupils: Pupils are equal, round, and reactive to light.  Neck:     Musculoskeletal: Full passive range of motion without pain.  Cardiovascular:     Rate and Rhythm: Normal rate and regular rhythm.     Pulses: Normal pulses.     Heart sounds: Normal heart sounds. No murmur. No  friction rub. No gallop.   Pulmonary:     Effort: Pulmonary effort is normal.     Breath sounds: Normal breath sounds.     Comments: Lungs clear to auscultation bilaterally.  Symmetric chest rise.  No wheezing, rales, rhonchi. Abdominal:     Palpations: Abdomen is soft. Abdomen is not rigid.     Tenderness: There is generalized abdominal tenderness. There is no right CVA tenderness, left CVA  tenderness or guarding.     Comments: Abdomen is soft, non-distended, diffuse tenderness no focal point. No rigidity, No guarding. No peritoneal signs.   Musculoskeletal: Normal range of motion.  Skin:    General: Skin is warm and dry.     Capillary Refill: Capillary refill takes less than 2 seconds.  Neurological:     Mental Status: She is alert and oriented to person, place, and time.  Psychiatric:        Speech: Speech normal.      ED Treatments / Results  Labs (all labs ordered are listed, but only abnormal results are displayed) Labs Reviewed  COMPREHENSIVE METABOLIC PANEL - Abnormal; Notable for the following components:      Result Value   BUN 21 (*)    Calcium 8.8 (*)    Albumin 3.2 (*)    AST 125 (*)    ALT 87 (*)    All other components within normal limits  CBC WITH DIFFERENTIAL/PLATELET - Abnormal; Notable for the following components:   WBC 31.9 (*)    Platelets 133 (*)    Neutro Abs 24.9 (*)    Monocytes Absolute 1.5 (*)    Abs Immature Granulocytes 2.26 (*)    All other components within normal limits  URINALYSIS, ROUTINE W REFLEX MICROSCOPIC - Abnormal; Notable for the following components:   APPearance CLOUDY (*)    Hgb urine dipstick MODERATE (*)    All other components within normal limits  URINALYSIS, MICROSCOPIC (REFLEX) - Abnormal; Notable for the following components:   Bacteria, UA MANY (*)    All other components within normal limits  NOVEL CORONAVIRUS, NAA (HOSP ORDER, SEND-OUT TO REF LAB; TAT 18-24 HRS)  CULTURE, BLOOD (ROUTINE X 2)  CULTURE, BLOOD  (ROUTINE X 2)  LIPASE, BLOOD  PREGNANCY, URINE  LACTIC ACID, PLASMA  SEDIMENTATION RATE  C-REACTIVE PROTEIN    EKG None  Radiology Ct Abdomen Pelvis W Contrast  Result Date: 06/09/2019 CLINICAL DATA:  38 year old female with abdominal pain. EXAM: CT ABDOMEN AND PELVIS WITH CONTRAST TECHNIQUE: Multidetector CT imaging of the abdomen and pelvis was performed using the standard protocol following bolus administration of intravenous contrast. CONTRAST:  129m OMNIPAQUE IOHEXOL 300 MG/ML  SOLN COMPARISON:  CT of the abdomen pelvis dated 06/02/2019. FINDINGS: Lower chest: Minimal bibasilar dependent atelectatic changes. No intra-abdominal free air. There is trace free fluid within the pelvis. Hepatobiliary: There is slight heterogeneous enhancement of the liver. Correlation with liver function tests recommended. A 1 cm hypodense focus in the dome of the liver (series 2, image 7) is not well characterized but may represent a cyst. No intrahepatic biliary ductal dilatation. The gallbladder is unremarkable. Pancreas: Unremarkable. No pancreatic ductal dilatation or surrounding inflammatory changes. Spleen: Top-normal spleen size measuring up to 13 cm in length. Adrenals/Urinary Tract: The adrenal glands are unremarkable. The kidneys, visualized ureters, and urinary bladder appear unremarkable. Stomach/Bowel: Mild prominence of the jejunal folds may be physiologic or represent mild enteritis. Clinical correlation is recommended. Several borderline dilated loops of small bowel in the mid abdomen, likely ileus. An early obstruction is favored less likely. The colon is unremarkable. The appendix is normal. Small pocket of air in the right lower abdomen (series 5, image 27 and series 2, image 49) is likely intraluminal. Vascular/Lymphatic: The abdominal aorta and IVC unremarkable. The SMV, splenic vein, and main portal vein are patent. No portal venous gas. Mildly enlarged periportal lymph nodes, likely reactive.  Reproductive: The uterus and ovaries are grossly unremarkable.  No pelvic mass. Other: Focal area of subcutaneous induration of the anterior upper abdominal wall. No fluid collection. Musculoskeletal: No acute or significant osseous findings. IMPRESSION: 1. Mild prominence of the jejunal folds may be physiologic or represent mild enteritis. Clinical correlation is recommended. No definite bowel obstruction. Normal appendix. 2. Mildly heterogeneous enhancement of the liver. Correlation with liver function tests recommended. Electronically Signed   By: Anner Crete M.D.   On: 06/09/2019 19:08   Dg Chest Portable 1 View  Result Date: 06/09/2019 CLINICAL DATA:  Cough and congestion.  Loss of smell for 3 days. EXAM: PORTABLE CHEST 1 VIEW COMPARISON:  June 05, 2018 FINDINGS: The heart size and mediastinal contours are within normal limits. Both lungs are clear. The visualized skeletal structures are unremarkable. IMPRESSION: No active disease. Electronically Signed   By: Dorise Bullion III M.D   On: 06/09/2019 16:16    Procedures Procedures (including critical care time)  Medications Ordered in ED Medications  sodium chloride 0.9 % bolus 1,000 mL (0 mLs Intravenous Stopped 06/09/19 1730)  ondansetron (ZOFRAN) injection 4 mg (4 mg Intravenous Given 06/09/19 1611)  sodium chloride 0.9 % bolus 1,000 mL (0 mLs Intravenous Stopped 06/09/19 2002)  ketorolac (TORADOL) 30 MG/ML injection 30 mg (30 mg Intravenous Given 06/09/19 1833)  metoCLOPramide (REGLAN) injection 10 mg (10 mg Intravenous Given 06/09/19 1839)  diphenhydrAMINE (BENADRYL) injection 25 mg (25 mg Intravenous Given 06/09/19 1838)  iohexol (OMNIPAQUE) 300 MG/ML solution 100 mL (100 mLs Intravenous Contrast Given 06/09/19 1844)  metroNIDAZOLE (FLAGYL) tablet 500 mg (500 mg Oral Given 06/09/19 2026)     Initial Impression / Assessment and Plan / ED Course  I have reviewed the triage vital signs and the nursing notes.  Pertinent labs &  imaging results that were available during my care of the patient were reviewed by me and considered in my medical decision making (see chart for details).        38 year old female who presents for generalized body aches, nausea/vomiting, diarrhea.  Reports subjective fever.  Initially arrival, she is afebrile, nontoxic-appearing.  Vital signs stable.  She has some generalized abdominal tenderness but no focal point.  Suspect this is most likely myalgias. Patient is afebrile, non-toxic appearing, sitting comfortably on examination table. Vital signs reviewed and stable. Consider infectious etiology. Also consider COVID 19. Plan to check labs, give fluids, antiemetics.  Patient with no back pain, neuro deficits.  History/physical exam is not concerning for cauda equina, spinal abscess.  Additionally, she has no neuro deficits.  History/physical exam not concerning for meningitis.  CBC shows leukocytosis of 31.9 with some bandemia.  Her most recent CBC was 7 on 06/02/2019.  CMP shows BUN of 21, creatinine within normal limits.  CMP shows mild elevation of her AST, ALT.  Lipase unremarkable.  Chest x-ray negative for any infectious etiology.  UA shows moderate hemoglobin but no evidence of infectious etiology.  Squamous epithelium are present so question any contaminant.  Urine pregnancy is negative.  Given her extensive leukocytosis, will plan to obtain blood cultures, ESR, CRP.  Discussed with patient.  Her abdominal exam shows some diffuse tenderness but with no focal point.  Given that she has worsening leukocytosis, I discussed with her repeat scanning to ensure that there is no intra-abdominal process.  Patient is in agreement.  Discussed with Dr. Darl Householder who agrees with plan.  ESR is unremarkable.  CT abdomen pelvis shows mild prominence of jejunal folds may represent mild enteritis.  She has mildly heterogenous  enhancement of the liver.  No other acute abnormalities.  Patient is hemodynamically  stable.  She appears somewhat better after fluids and medicines.  She reports her myalgias and headache have improved slightly.  She states she still feels "rundown."  At this time, she is tolerating p.o.  Discussed with Dr. Darl Householder who independently evaluated patient.  We will plan to cover for C. difficile given leukocytosis and recent Cipro.  Additionally, she is having diarrhea.  She has not had any diarrhea while here in the ED.  Send her home with Flagyl, short course of Phenergan to help with nausea/vomiting.  I instructed patient that she has a Covid test pending and should quarantine.  Additionally, instructed patient that she will need to have her labs rechecked in about a week to ensure that leukocytosis is improving. At this time, patient exhibits no emergent life-threatening condition that require further evaluation in ED or admission. Patient had ample opportunity for questions and discussion. All patient's questions were answered with full understanding. Strict return precautions discussed. Patient expresses understanding and agreement to plan.   Amy Walls was evaluated in Emergency Department on 06/09/2019 for the symptoms described in the history of present illness. She was evaluated in the context of the global COVID-19 pandemic, which necessitated consideration that the patient might be at risk for infection with the SARS-CoV-2 virus that causes COVID-19. Institutional protocols and algorithms that pertain to the evaluation of patients at risk for COVID-19 are in a state of rapid change based on information released by regulatory bodies including the CDC and federal and state organizations. These policies and algorithms were followed during the patient's care in the ED.  Portions of this note were generated with [employer]. Dictation errors may occur despite best attempts at proofreading.   Final Clinical Impressions(s) / ED Diagnoses   Final diagnoses:  Viral syndrome    Leukocytosis, unspecified type    ED Discharge Orders         Ordered    metroNIDAZOLE (FLAGYL) 500 MG tablet  2 times daily     06/09/19 2017    promethazine (PHENERGAN) 25 MG tablet  Every 6 hours PRN     06/09/19 2017           Desma Mcgregor 06/09/19 2118    Drenda Freeze, MD 06/09/19 2322

## 2019-06-09 NOTE — ED Notes (Signed)
ED Provider at bedside. 

## 2019-06-09 NOTE — Discharge Instructions (Signed)
As we discussed today, your work-up did show an elevated white blood cell count which was increased from the previous visit.  Your CT scan showed some possible inflammation/irritation but otherwise was reassuring.  There is no cause as to why your white blood cell count was elevated.  Will need to have your blood cell count rechecked in approximately 1 week by her primary care doctor.  As we discussed, this may be C. difficile.  We have started you on Flagyl to combat this.  If you can provide a stool sample, you can follow-up with your primary care doctor and have this tested  You have a COVID-19 test pending.  The results will take about 24 to 48 hours to return.  You can check online using MyChart regarding the status of your test.  You should quarantine until the results come back.  If they are positive, you will need to quarantine for 2 additional weeks.  Return the emergency department for any fever, worsening pain, inability eat or drink anything, difficulty breathing or any other worsening concerning symptoms.

## 2019-06-09 NOTE — ED Notes (Signed)
Pt has called for ride home. Verbalized understanding to pick up Rx at pharmacy listed on d/c paperwork

## 2019-06-10 ENCOUNTER — Telehealth (HOSPITAL_BASED_OUTPATIENT_CLINIC_OR_DEPARTMENT_OTHER): Payer: Self-pay | Admitting: Emergency Medicine

## 2019-06-11 LAB — CULTURE, BLOOD (ROUTINE X 2): Special Requests: ADEQUATE

## 2019-06-11 LAB — NOVEL CORONAVIRUS, NAA (HOSP ORDER, SEND-OUT TO REF LAB; TAT 18-24 HRS): SARS-CoV-2, NAA: NOT DETECTED

## 2019-06-12 ENCOUNTER — Telehealth: Payer: Self-pay | Admitting: *Deleted

## 2019-06-12 NOTE — Telephone Encounter (Signed)
Post ED Visit - Positive Culture Follow-up  Culture report reviewed by antimicrobial stewardship pharmacist: [facility] Team []  Elenor Quinones, Pharm.D. []  Heide Guile, Pharm.D., BCPS AQ-ID []  Parks Neptune, Pharm.D., BCPS []  Alycia Rossetti, Pharm.D., BCPS []  [place], [place].D., BCPS, AAHIVP []  Legrand Como, Pharm.D., BCPS, AAHIVP []  Salome Arnt, PharmD, BCPS []  Johnnette Gourd, PharmD, BCPS []  Hughes Better, PharmD, BCPS []  Leeroy Cha, PharmD []  Laqueta Linden, PharmD, BCPS []  Albertina Parr, PharmD  [facility] Team []  Leodis Sias, PharmD []  Lindell Spar, PharmD []  Royetta Asal, PharmD []  Graylin Shiver, Rph []  Rema Fendt) Glennon Mac, PharmD []  Arlyn Dunning, PharmD []  Netta Cedars, PharmD []  Dia Sitter, PharmD []  Leone Haven, PharmD []  Gretta Arab, PharmD []  Theodis Shove, PharmD []  Peggyann Juba, PharmD []  Reuel Boom, PharmD   Positive urine culture, reviewed by Duanne Limerick, PharmD Likely contaminant and no further patient follow-up is required at this time.  Harlon Flor [facility] 06/12/2019, 10:46 AM

## 2019-06-14 LAB — CULTURE, BLOOD (ROUTINE X 2)
Culture: NO GROWTH
Special Requests: ADEQUATE

## 2019-06-19 ENCOUNTER — Ambulatory Visit: Payer: Medicaid Other | Admitting: Infectious Diseases

## 2019-08-08 ENCOUNTER — Telehealth: Payer: Self-pay | Admitting: Family

## 2019-08-08 DIAGNOSIS — B182 Chronic viral hepatitis C: Secondary | ICD-10-CM

## 2019-08-08 NOTE — Telephone Encounter (Signed)
Amy Walls is a 38 year old female with genotype 3 chronic hepatitis C.  She will need new blood work as her last viral load was from 09/02/2014 showing a result of 176,000.  Lab work placed in the computer and request [name] to make a lab visit to repeat blood work if she is still interested in being treated for hepatitis C.

## 2019-08-09 NOTE — Telephone Encounter (Addendum)
[facility] Patient Advocate Encounter  Attempted to call the number listed and was given the number [phone] to try and reach the patient. Someone answered and hung up, making it unavailable to leave a voicemail. Will try again tomorrow to get in touch with the patient and schedule an appointment. It appears Amy Walls no showed the previous follow-up appointments.   Patient no showed the 08/30/2019 lab appointment. Will reach out one more time to see if Amy Walls still wants to be treated. Amy Walls was very insistent on being treated immediately after all the repeated visits.

## 2019-08-30 ENCOUNTER — Other Ambulatory Visit: Payer: Medicaid Other

## 2019-09-07 ENCOUNTER — Emergency Department (HOSPITAL_BASED_OUTPATIENT_CLINIC_OR_DEPARTMENT_OTHER)
Admission: EM | Admit: 2019-09-07 | Discharge: 2019-09-07 | Disposition: A | Discharge status: Home / Self Care | Payer: Self-pay | Attending: Emergency Medicine | Admitting: Emergency Medicine

## 2019-09-07 ENCOUNTER — Encounter (HOSPITAL_BASED_OUTPATIENT_CLINIC_OR_DEPARTMENT_OTHER): Payer: Self-pay | Admitting: *Deleted

## 2019-09-07 ENCOUNTER — Other Ambulatory Visit: Payer: Self-pay

## 2019-09-07 ENCOUNTER — Emergency Department (HOSPITAL_BASED_OUTPATIENT_CLINIC_OR_DEPARTMENT_OTHER): Payer: Self-pay

## 2019-09-07 DIAGNOSIS — M797 Fibromyalgia: Secondary | ICD-10-CM | POA: Insufficient documentation

## 2019-09-07 DIAGNOSIS — Z881 Allergy status to other antibiotic agents status: Secondary | ICD-10-CM | POA: Insufficient documentation

## 2019-09-07 DIAGNOSIS — S60211A Contusion of right wrist, initial encounter: Secondary | ICD-10-CM | POA: Insufficient documentation

## 2019-09-07 DIAGNOSIS — Y92009 Unspecified place in unspecified non-institutional (private) residence as the place of occurrence of the external cause: Secondary | ICD-10-CM | POA: Insufficient documentation

## 2019-09-07 DIAGNOSIS — Y9301 Activity, walking, marching and hiking: Secondary | ICD-10-CM | POA: Insufficient documentation

## 2019-09-07 DIAGNOSIS — Y999 Unspecified external cause status: Secondary | ICD-10-CM | POA: Insufficient documentation

## 2019-09-07 DIAGNOSIS — F17210 Nicotine dependence, cigarettes, uncomplicated: Secondary | ICD-10-CM | POA: Insufficient documentation

## 2019-09-07 DIAGNOSIS — W010XXA Fall on same level from slipping, tripping and stumbling without subsequent striking against object, initial encounter: Secondary | ICD-10-CM | POA: Insufficient documentation

## 2019-09-07 DIAGNOSIS — S63501A Unspecified sprain of right wrist, initial encounter: Secondary | ICD-10-CM | POA: Insufficient documentation

## 2019-09-07 DIAGNOSIS — K7150 Toxic liver disease with chronic active hepatitis without ascites: Secondary | ICD-10-CM | POA: Insufficient documentation

## 2019-09-07 DIAGNOSIS — J45909 Unspecified asthma, uncomplicated: Secondary | ICD-10-CM | POA: Insufficient documentation

## 2019-09-07 DIAGNOSIS — Z885 Allergy status to narcotic agent status: Secondary | ICD-10-CM | POA: Insufficient documentation

## 2019-09-07 DIAGNOSIS — Z880 Allergy status to penicillin: Secondary | ICD-10-CM | POA: Insufficient documentation

## 2019-09-07 DIAGNOSIS — Z882 Allergy status to sulfonamides status: Secondary | ICD-10-CM | POA: Insufficient documentation

## 2019-09-07 MED ORDER — IBUPROFEN 400 MG PO TABS
600.0000 mg | ORAL_TABLET | Freq: Once | ORAL | Status: AC
  Administered 2019-09-07: 600 mg via ORAL
  Filled 2019-09-07: qty 1

## 2019-09-07 MED ORDER — OXYCODONE HCL 5 MG PO TABS
5.0000 mg | ORAL_TABLET | Freq: Once | ORAL | Status: AC
  Administered 2019-09-07: 5 mg via ORAL
  Filled 2019-09-07: qty 1

## 2019-09-07 NOTE — Discharge Instructions (Addendum)
Your x-rays did not show any broken bones. This is likely a severe sprain. This may take several weeks to heal. Take ibuprofen 600 mg every 6 hours as needed for pain. You can continue to wear the wrist splint as needed for comfort.

## 2019-09-07 NOTE — ED Triage Notes (Signed)
Tripped over her dog last night. Right wrist is swollen and the pain radiates to her elbow.

## 2019-09-07 NOTE — ED Provider Notes (Signed)
[facility] EMERGENCY DEPARTMENT Provider Note   CSN: [id number] Arrival date & time: 09/07/19  1028     History Chief Complaint  Patient presents with  . Fall    Amy Walls is a 39 y.o. female.  HPI   39 year old female with right wrist, forearm and elbow pain.  She tripped over her dog last night.  Severe persistent pain since then.  Denies any other injuries.  She has tried ibuprofen and topical over-the-counter pain patch without much improvement.  Past Medical History:  Diagnosis Date  . ADHD (attention deficit hyperactivity disorder)   . Anxiety   . Arthritis    "inflammation of all my joints"  . Asthma   . Bowel obstruction (HCC) 2007   "upper bowel"  . Chronic bronchitis    "yearly"  . Chronic mid back pain   . Complication of anesthesia 2007   "lost me on the table when I had upper bowel obstruction"  . Constipation, chronic   . Depression   . Fibromyalgia   . Hepatitis C   . Heroin abuse (HCC)   . Hypoglycemia    "my sugar runs low alot"  . IV drug user   . Kidney infection    "often"  . Migraines 12/28/11   "@ least once/wk"  . PONV (postoperative nausea and vomiting)   . Recurrent UTI     Patient Active Problem List   Diagnosis Date Noted  . Bipolar disorder (HCC) 02/20/2019  . Nausea vomiting and diarrhea 08/29/2014  . Intractable vomiting 08/29/2014  . Diarrhea 08/29/2014  . Chronic hepatitis C without hepatic coma (HCC) 2014  . Intussusception (HCC) 01/01/2012  . Acute back pain 12/28/2011  . Abdominal pain 12/28/2011  . Transaminitis 12/28/2011  . Asthma 12/28/2011  . IBS (irritable bowel syndrome) 12/28/2011  . Tobacco abuse 12/28/2011    Past Surgical History:  Procedure Laterality Date  . BOWEL RESECTION  04/2006   "upper bowel"  . OVARY SURGERY  ~ 2002   "had 10 inches blood built up; they opened me up and got the blood out"     OB History   No obstetric history on file.     Family History  Problem Relation Age  of Onset  . Breast cancer Maternal Grandmother   . Colon cancer Maternal Grandmother   . Irritable bowel syndrome Maternal Grandmother   . Diabetes Mother   . Heart disease Father   . Cancer Maternal Aunt        colon cancer   . Vision loss Maternal Grandfather     Social History   Tobacco Use  . Smoking status: Current Every Day Smoker    Packs/day: 0.50    Years: 20.00    Pack years: 10.00    Types: Cigarettes  . Smokeless tobacco: Never Used  Substance Use Topics  . Alcohol use: Not Currently  . Drug use: Not Currently    Types: Cocaine, Heroin, Other-see comments    Comment: states not currrently using, she is on methadone    Home Medications Prior to Admission medications   Not on File    Allergies    Cinnamon, Levaquin [levofloxacin], Morphine and related, Penicillins, Acetaminophen, and Sulfa antibiotics  Review of Systems   Review of Systems All systems reviewed and negative, other than as noted in HPI.  Physical Exam Updated Vital Signs BP 106/65 (BP Location: Left Arm)   Pulse 76   Temp 99.2 F (37.3 C) (Oral)   Resp  14   Ht 4\' 10"  (1.473 m)   Wt 56.7 kg   LMP 09/04/2019   SpO2 98%   BMI 26.13 kg/m   Physical Exam Vitals and nursing note reviewed.  Constitutional:      General: She is not in acute distress.    Appearance: She is well-developed.  HENT:     Head: Normocephalic and atraumatic.  Eyes:     General:        Right eye: No discharge.        Left eye: No discharge.     Conjunctiva/sclera: Conjunctivae normal.  Cardiovascular:     Rate and Rhythm: Normal rate and regular rhythm.     Heart sounds: Normal heart sounds. No murmur. No friction rub. No gallop.   Pulmonary:     Effort: Pulmonary effort is normal. No respiratory distress.     Breath sounds: Normal breath sounds.  Abdominal:     General: There is no distension.     Palpations: Abdomen is soft.     Tenderness: There is no abdominal tenderness.  Musculoskeletal:         General: Swelling, tenderness, deformity and signs of injury present.     Cervical back: Neck supple.     Comments: Mild swelling of the right wrist.  Tenderness to palpation distal rest, particular over the distal radius.  Minimal passive range of motion secondary to pain.  Closed injury.  Neurovascularly intact.  Skin:    General: Skin is warm and dry.  Neurological:     Mental Status: She is alert.  Psychiatric:        Behavior: Behavior normal.        Thought Content: Thought content normal.     ED Results / Procedures / Treatments   Labs (all labs ordered are listed, but only abnormal results are displayed) Labs Reviewed - No data to display  EKG None  Radiology DG Elbow Complete Right  Result Date: 09/07/2019 CLINICAL DATA:  Pt states that yesterday she fell over her dog and landed on her right wrist. She is having pain around the lateral side, from the 1st metacarpal all the way into her distal radius. Swelling and bruising in the area as well. Pt was unable to bend wrist and did not want to bend wrist for navicular view due to the pain. EXAM: RIGHT ELBOW - COMPLETE 3+ VIEW COMPARISON:  None. FINDINGS: There is no evidence of fracture, dislocation, or joint effusion. There is no evidence of arthropathy or other focal bone abnormality. Soft tissues are unremarkable. IMPRESSION: Negative. Electronically Signed   By: Lajean Manes M.D.   On: 09/07/2019 12:06   DG Wrist Complete Right  Result Date: 09/07/2019 CLINICAL DATA:  Pt states that yesterday she fell over her dog and landed on her right wrist. She is having pain around the lateral side, from the 1st metacarpal all the way into her distal radius. Swelling and bruising in the area as well. Pt was unable to bend wrist and did not want to bend wrist for navicular view due to the pain. EXAM: RIGHT WRIST - COMPLETE 3+ VIEW COMPARISON:  None. FINDINGS: No fracture or bone lesion. Joints normally spaced and aligned.  No arthropathic  changes. Mild soft tissue swelling suggested. IMPRESSION: No fracture, bone lesion or joint abnormality. Electronically Signed   By: Lajean Manes M.D.   On: 09/07/2019 12:06    Procedures Procedures (including critical care time)  Medications Ordered in ED Medications  ibuprofen (ADVIL) tablet 600 mg (has no administration in time range)  oxyCODONE (Oxy IR/ROXICODONE) immediate release tablet 5 mg (has no administration in time range)    ED Course  I have reviewed the triage vital signs and the nursing notes.  Pertinent labs & imaging results that were available during my care of the patient were reviewed by me and considered in my medical decision making (see chart for details).    MDM Rules/Calculators/A&P                     39 year old female with right wrist pain after mechanical fall.  Closed injury. NVI.   Imaging.  Pain medication.  Imaging negative.  As needed pain medication.  Activity as tolerated.  Sports medicine follow-up if symptoms persist beyond expected course.  Final Clinical Impression(s) / ED Diagnoses Final diagnoses:  Contusion of right wrist, initial encounter  Sprain of right wrist, initial encounter    Rx / DC Orders ED Discharge Orders    None       Raeford Razor, MD 09/07/19 1235

## 2019-10-31 ENCOUNTER — Telehealth: Payer: Self-pay | Admitting: Pharmacy Technician

## 2019-10-31 NOTE — Telephone Encounter (Signed)
[facility] Patient Advocate Encounter  Patient came in June 2020 and filled out necessary forms for assistance but was told to wait to get treatment. After multiple appointments she got tired of being rescheduled and being called an addict from Dr. Lorenso Courier that she opted not to return. We reconnected with the patient in January and got her set up with Tammy Sours and labs but she no showed. Last attempt to reach patient was today. Her voicemail is full on the most updated cell [phone]. The labs and papers we have are more than 6 months old so for treatment we will need to restart the process for assistance.

## 2019-11-07 ENCOUNTER — Ambulatory Visit: Payer: Medicaid Other | Admitting: Pharmacist

## 2019-11-07 ENCOUNTER — Other Ambulatory Visit: Payer: Self-pay | Admitting: Pharmacist

## 2019-11-29 ENCOUNTER — Ambulatory Visit: Payer: Medicaid Other | Admitting: Pharmacist

## 2019-12-05 IMAGING — CT CT RENAL STONE PROTOCOL
2 of 4 series · 16 of 46 positions shown, 18 images · non-contrast
Comparison: 07/24/2018

CLINICAL DATA: Right flank pain. Dysuria for 3 days.

EXAM:
CT ABDOMEN AND PELVIS WITHOUT CONTRAST
TECHNIQUE: Multidetector CT imaging of the abdomen and pelvis was performed
following the standard protocol without IV contrast.

[Series 2: axial st · axial · 0.79mm/px · z∈[+246,+676]mm · 13 of 94 slices shown, 15 images]
[im 4/94  soft-tissue]
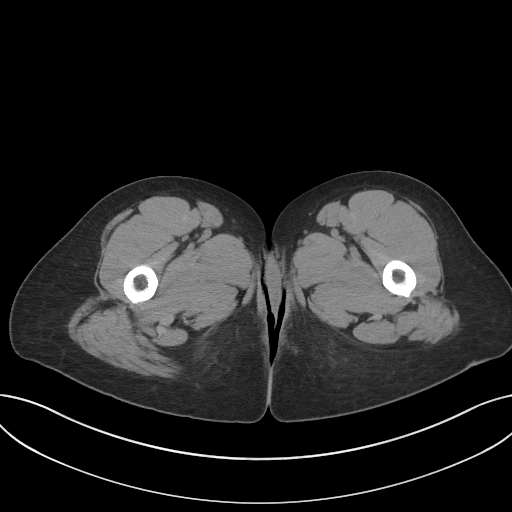
[im 4/94  bone]
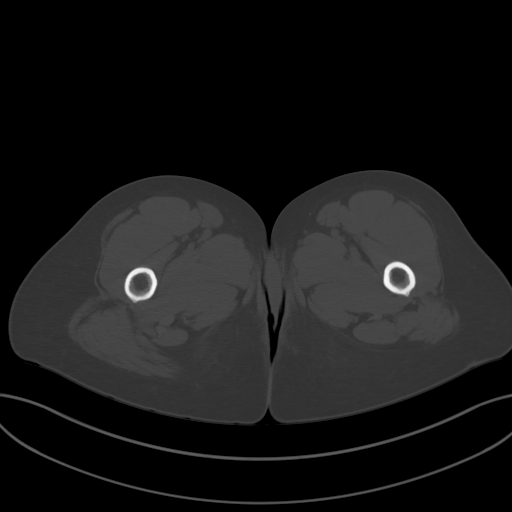
[im 12/94  soft-tissue]
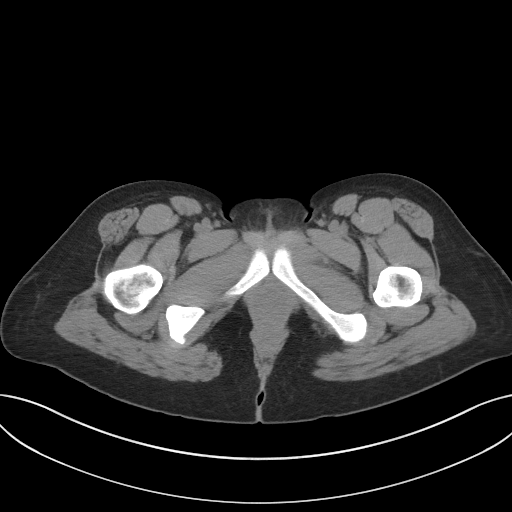
[im 19/94  soft-tissue]
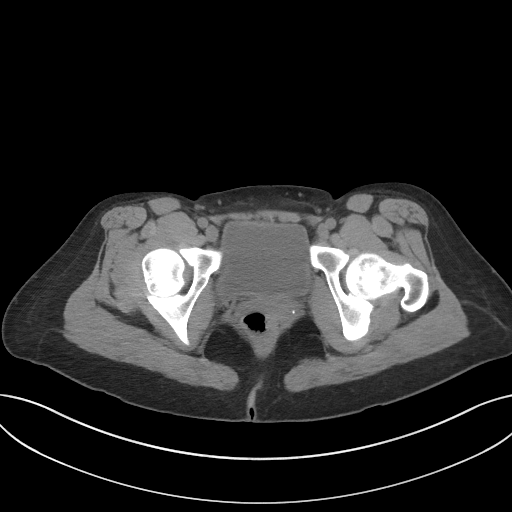
[im 27/94  soft-tissue]
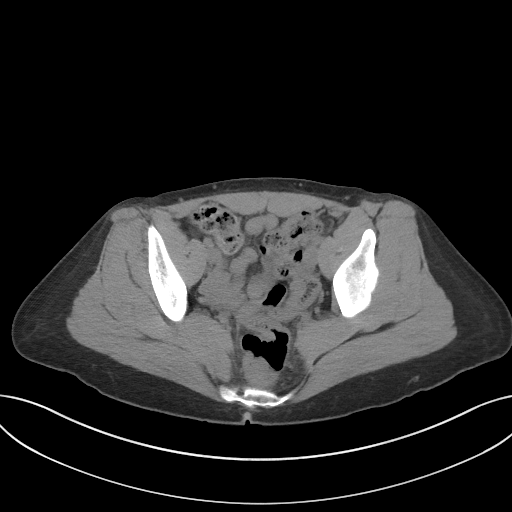
[im 34/94  soft-tissue]
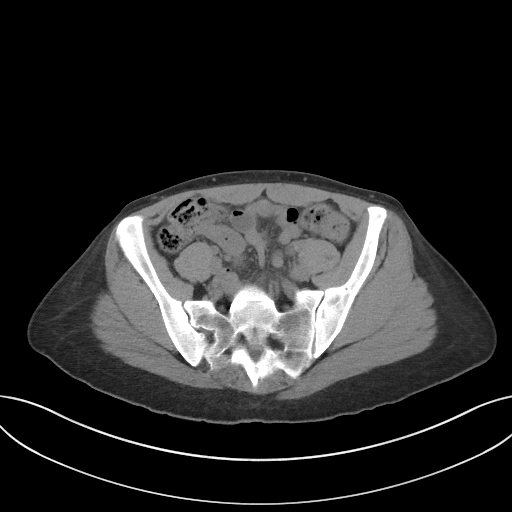
[im 41/94  soft-tissue]
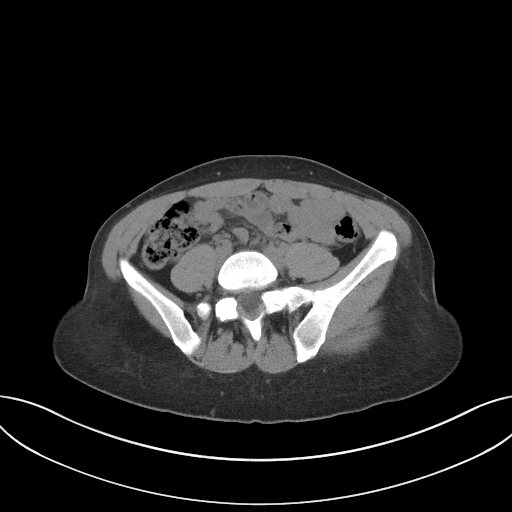
[im 49/94  soft-tissue]
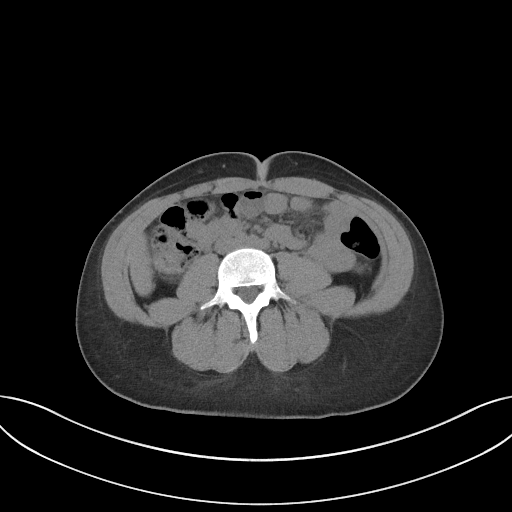
[im 53/94  soft-tissue]
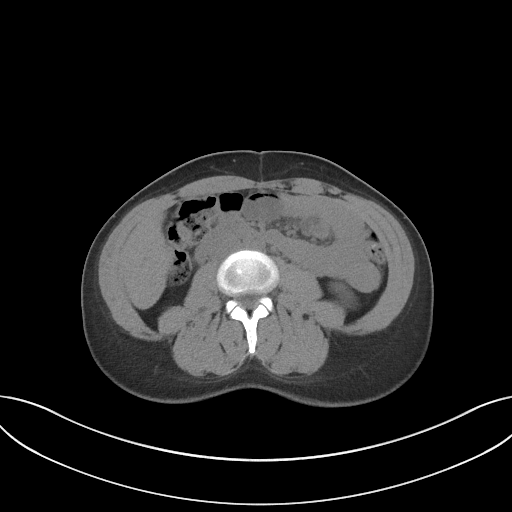
[im 60/94  soft-tissue]
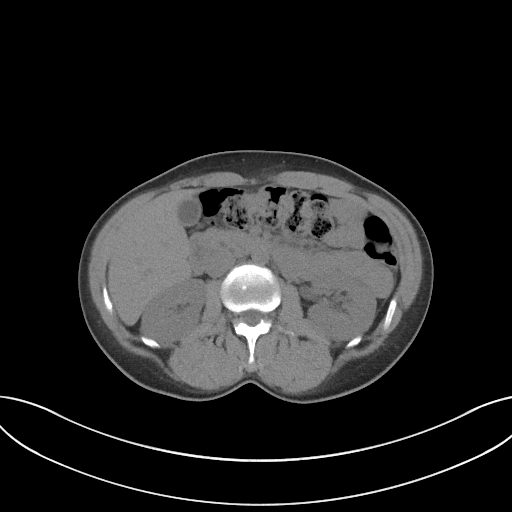
[im 60/94  bone]
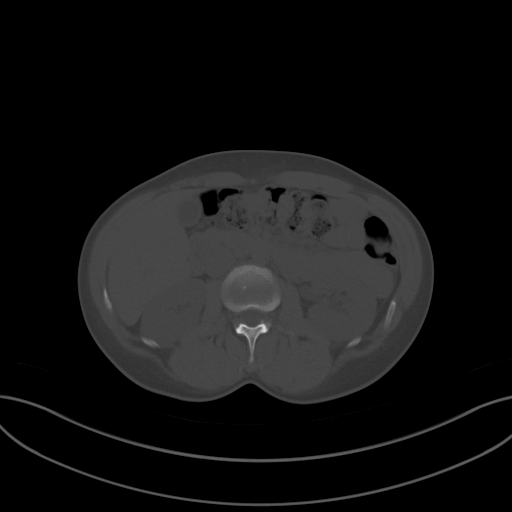
[im 67/94  soft-tissue]
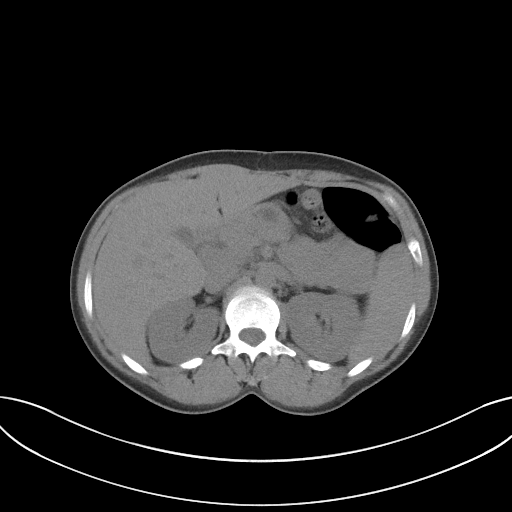
[im 75/94  soft-tissue]
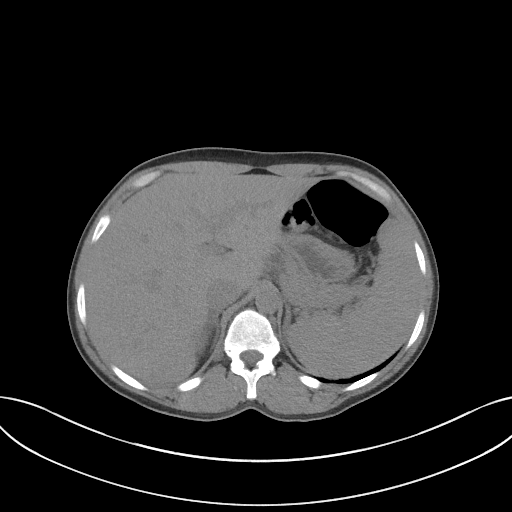
[im 82/94  soft-tissue]
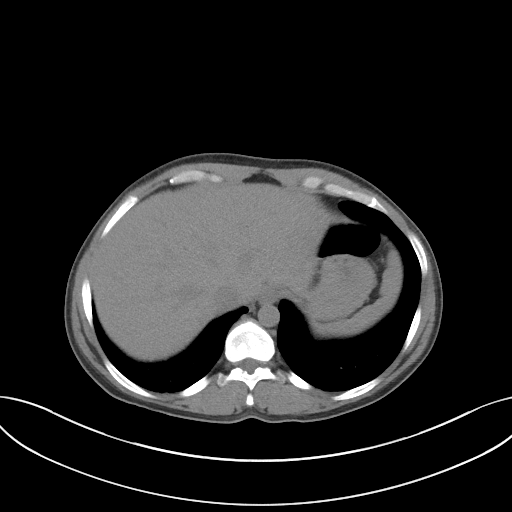
[im 90/94  soft-tissue]
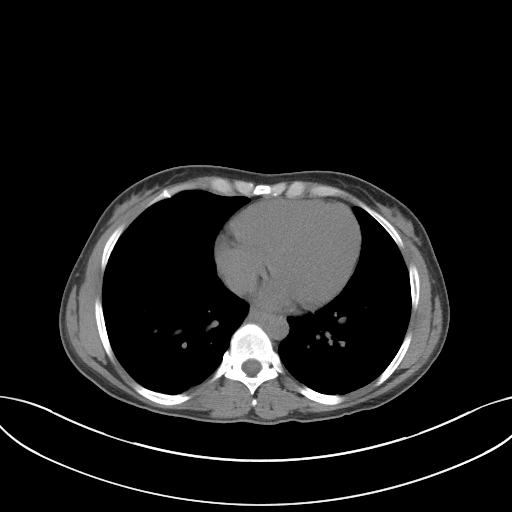

[Series 5: coronal st · coronal · 0.82mm/px · 3 of 100 slices shown]
[im 34/100  soft-tissue]
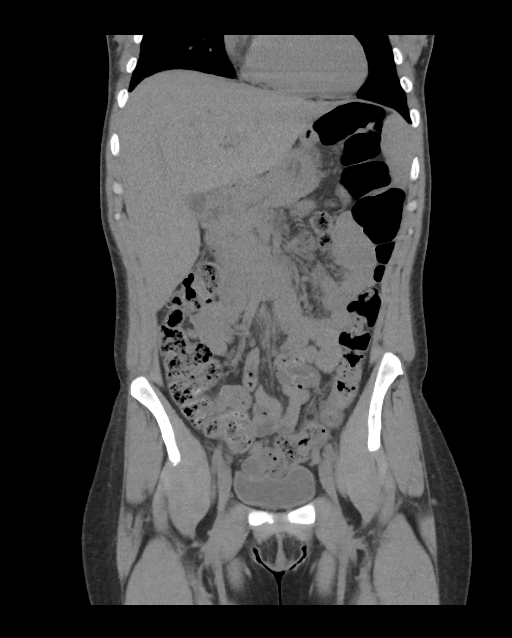
[im 45/100  soft-tissue]
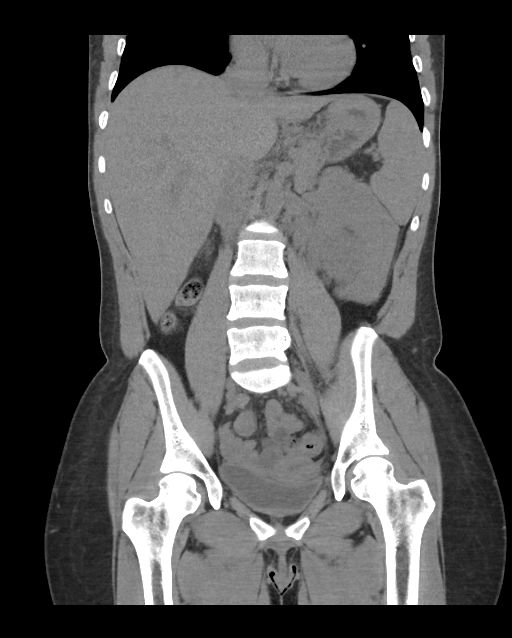
[im 56/100  soft-tissue]
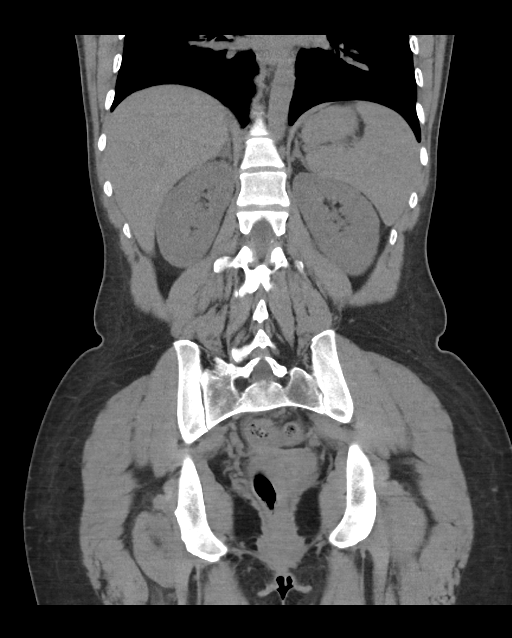

[16 of 46 positions shown; findings below may reference images not displayed]

FINDINGS: Lower chest: Clear lung bases. Normal heart size without pericardial
or pleural effusion.

Hepatobiliary: Normal liver. Normal gallbladder, without biliary
ductal dilatation.

Pancreas: Normal, without mass or ductal dilatation.

Spleen: Normal in size, without focal abnormality.

Adrenals/Urinary Tract: Normal adrenal glands. No renal calculi or
hydronephrosis. No hydroureter or ureteric calculi. No bladder
calculi.

Stomach/Bowel: Normal stomach, without wall thickening. Colonic
stool burden suggests constipation. Normal terminal ileum and
appendix. Normal small bowel.

Vascular/Lymphatic: Normal caliber of the aorta and branch vessels.
No abdominopelvic adenopathy.

Reproductive: Normal uterus and adnexa.

Other: No significant free fluid.

Musculoskeletal: No acute osseous abnormality.
IMPRESSION: 1. No urinary tract calculi or hydronephrosis.
2. Possible constipation.

## 2020-01-30 ENCOUNTER — Ambulatory Visit: Payer: Medicaid Other | Admitting: Pharmacist

## 2020-01-30 ENCOUNTER — Telehealth: Payer: Self-pay | Admitting: Pharmacy Technician

## 2020-01-30 NOTE — Telephone Encounter (Signed)
[facility] Patient Advocate Encounter    Findings of the benefits investigation:   Insurance: patient remains uninsured with [employer]  [facility] Patient Advocate will follow up once patient arrives for their appointment and get her most updated phone number since her number changed so many times last treatment attempt. Will get new applications signed for patient assistance.    Beulah Gandy, CPhT [facility] Patient [facility] Phone: [phone] Fax: [phone] 01/30/2020 8:42 AM

## 2020-02-06 ENCOUNTER — Ambulatory Visit: Payer: Medicaid Other | Admitting: Pharmacist

## 2020-02-12 ENCOUNTER — Other Ambulatory Visit: Payer: Self-pay

## 2020-02-12 ENCOUNTER — Encounter (HOSPITAL_BASED_OUTPATIENT_CLINIC_OR_DEPARTMENT_OTHER): Payer: Self-pay | Admitting: Emergency Medicine

## 2020-02-12 ENCOUNTER — Emergency Department (HOSPITAL_BASED_OUTPATIENT_CLINIC_OR_DEPARTMENT_OTHER)
Admission: EM | Admit: 2020-02-12 | Discharge: 2020-02-12 | Disposition: A | Discharge status: Home / Self Care | Payer: Medicaid Other | Attending: Emergency Medicine | Admitting: Emergency Medicine

## 2020-02-12 DIAGNOSIS — F17210 Nicotine dependence, cigarettes, uncomplicated: Secondary | ICD-10-CM | POA: Insufficient documentation

## 2020-02-12 DIAGNOSIS — R21 Rash and other nonspecific skin eruption: Secondary | ICD-10-CM | POA: Insufficient documentation

## 2020-02-12 NOTE — Discharge Instructions (Addendum)
Please make sure to follow-up with your primary care doctor and/or the provided dermatology referral.  Please take Pepcid or Benadryl for itching.  Your symptoms may not be related to scabies since there was only mild improvement with treatment.  Please make sure to follow-up with infectious disease for treatment of your hepatitis C.

## 2020-02-12 NOTE — ED Provider Notes (Signed)
[place] EMERGENCY DEPARTMENT Provider Note   CSN: [id number] Arrival date & time: 02/12/20  2426     History Chief Complaint  Patient presents with  . Skin Problem    Amy Walls is a 39 y.o. female.  HPI 39 year old female with a history of hepatitis C, heroin abuse, fibromyalgia, IV drug use, asthma presents to the ER with a 2-week history of rash.  Patient [facility] and [facility] with [facility]ab[facility], given permethrin cream.  She states that she has been putting it on her hands and her toes, reports mild improvement in her toes, but she feels like this is not helping the rash on her hands.  She states that she sees small bugs in her hands and does think that this is scabies.  She also complains of a lesion on her tongue, and on the back of her neck and on her head.  She reports that they are itchy and mildly painful.  She has not had any fevers or chills, no blisters.  Per chart review, patient has not been seeing infectious disease for her hep C secondary to being "called an addict".  She has not been compliant with her medications.  She also states that she was diagnosed with trichomonas at the [facility] and given antibiotics.  Reports that she wants additional antibiotics because she "feels like it is in my mouth".    Past Medical History:  Diagnosis Date  . ADHD (attention deficit hyperactivity disorder)   . Anxiety   . Arthritis    "inflammation of all my joints"  . Asthma   . Bowel obstruction ([place]) 2007   "upper bowel"  . Chronic bronchitis    "yearly"  . Chronic mid back pain   . Complication of anesthesia 2007   "lost me on the table when I had upper bowel obstruction"  . Constipation, chronic   . Depression   . Fibromyalgia   . Hepatitis C   . Heroin abuse ([place])   . Hypoglycemia    "my sugar runs low alot"  . IV drug user   . Kidney infection    "often"  . Migraines 12/28/11   "@ least once/wk"  . PONV  (postoperative nausea and vomiting)   . Recurrent UTI     Patient Active Problem List   Diagnosis Date Noted  . Bipolar disorder ([place]) 02/20/2019  . Nausea vomiting and diarrhea 08/29/2014  . Intractable vomiting 08/29/2014  . Diarrhea 08/29/2014  . Chronic hepatitis C without hepatic coma ([place]) 2014  . Intussusception ([place]) 01/01/2012  . Acute back pain 12/28/2011  . Abdominal pain 12/28/2011  . Transaminitis 12/28/2011  . Asthma 12/28/2011  . IBS (irritable bowel syndrome) 12/28/2011  . Tobacco abuse 12/28/2011    Past Surgical History:  Procedure Laterality Date  . BOWEL RESECTION  04/2006   "upper bowel"  . OVARY SURGERY  ~ 2002   "had 10 inches blood built up; they opened me up and got the blood out"     OB History   No obstetric history on file.     Family History  Problem Relation Age of Onset  . Breast cancer Maternal Grandmother   . Colon cancer Maternal Grandmother   . Irritable bowel syndrome Maternal Grandmother   . Diabetes Mother   . Heart disease Father   . Cancer Maternal Aunt        colon cancer   . Vision loss Maternal  Grandfather     Social History   Tobacco Use  . Smoking status: Current Every Day Smoker    Packs/day: 0.50    Years: 20.00    Pack years: 10.00    Types: Cigarettes  . Smokeless tobacco: Never Used  Substance Use Topics  . Alcohol use: Not Currently  . Drug use: Not Currently    Types: Cocaine, Heroin, Other-see comments    Comment: sober > 1 yr    Home Medications Prior to Admission medications   Not on File    Allergies    Cinnamon, Levaquin [levofloxacin], Morphine and related, Penicillins, and Acetaminophen  Review of Systems   Review of Systems  Constitutional: Negative for chills and fever.  Skin: Positive for rash and wound.    Physical Exam Updated Vital Signs BP 136/88   Pulse 63   Temp 98.6 F (37 C) (Oral)   Resp 18   Ht 4\' 10"  (1.473 m)   Wt 49.9 kg   SpO2 100%   BMI 22.99 kg/m    Physical Exam Vitals and nursing note reviewed.  Constitutional:      General: She is not in acute distress.    Appearance: She is well-developed.  HENT:     Head: Normocephalic and atraumatic.  Eyes:     Conjunctiva/sclera: Conjunctivae normal.  Cardiovascular:     Rate and Rhythm: Normal rate and regular rhythm.     Heart sounds: No murmur heard.   Pulmonary:     Effort: Pulmonary effort is normal. No respiratory distress.     Breath sounds: Normal breath sounds.  Abdominal:     Palpations: Abdomen is soft.     Tenderness: There is no abdominal tenderness.  Musculoskeletal:     Cervical back: Neck supple.     Comments: Solitary mildly erythematous with central ulcerated lesion on the back of her neck, back.  Similar appearing lesion on the tongue.  She has multiple small wounds on her fingers, no evidence of burrows.  Toes without evidence of lesions or burrows.  No evidence of bullae, desquamation.  No vesicular rash.  Skin:    General: Skin is warm and dry.  Neurological:     Mental Status: She is alert.    .       ED Results / Procedures / Treatments   Labs (all labs ordered are listed, but only abnormal results are displayed) Labs Reviewed - No data to display  EKG None  Radiology No results found.  Procedures Procedures (including critical care time)  Medications Ordered in ED Medications - No data to display  ED Course  I have reviewed the triage vital signs and the nursing notes.  Pertinent labs & imaging results that were available during my care of the patient were reviewed by me and considered in my medical decision making (see chart for details).    MDM Rules/Calculators/A&P                          Rash consistent with unknown etiology. Patient denies any difficulty breathing or swallowing.  Pt has a patent airway without stridor and is handling secretions without difficulty; no angioedema. No blisters, no pustules, no warmth, no draining  sinus tracts, no superficial abscesses, no bullous impetigo, no vesicles, no desquamation, no target lesions with dusky purpura or a central bulla. Not tender to touch. No concern for superimposed infection. No concern for SJS, TEN, TSS, tick borne illness, syphilis  or other life-threatening condition.  Patient has a history of poorly controlled hepatitis C and poor follow-up.  Additional history of IV drug use.  She has no vaginal complaints, do not think additional treatment for trach is needed at this time.  Discussed with Dr. Stevie Kern, unclear etiology, however permethrin cream repeat is not indicated given little improvement in symptoms.  I encouraged her to follow-up with her PCP, provided dermatology referral.  I also stressed that she needs to follow-up with infectious disease to prevent sequelae from hepatitis C.  Encouraged Pepcid or Benadryl for pruritus.  All the patient's questions have been answered to her satisfaction, she voices understanding, and is agreeable to this plan.  At this stage in the ED course, the patient has been adequately screened and is stable for discharge.   Final Clinical Impression(s) / ED Diagnoses Final diagnoses:  Rash    Rx / DC Orders ED Discharge Orders    None       Leone Brand 02/12/20 1126    Milagros Loll, MD 02/13/20 1005

## 2020-02-12 NOTE — ED Triage Notes (Signed)
Pt sts she [facility] ago, but tx has not helped; reports bumps and itching to hands, neck, head and chest

## 2020-02-18 ENCOUNTER — Ambulatory Visit: Payer: Medicaid Other | Admitting: Pharmacist

## 2020-02-20 ENCOUNTER — Emergency Department (HOSPITAL_BASED_OUTPATIENT_CLINIC_OR_DEPARTMENT_OTHER)
Admission: EM | Admit: 2020-02-20 | Discharge: 2020-02-20 | Disposition: A | Discharge status: Home / Self Care | Payer: Medicaid Other | Attending: Emergency Medicine | Admitting: Emergency Medicine

## 2020-02-20 ENCOUNTER — Encounter (HOSPITAL_BASED_OUTPATIENT_CLINIC_OR_DEPARTMENT_OTHER): Payer: Self-pay

## 2020-02-20 ENCOUNTER — Other Ambulatory Visit: Payer: Self-pay

## 2020-02-20 DIAGNOSIS — G43809 Other migraine, not intractable, without status migrainosus: Secondary | ICD-10-CM

## 2020-02-20 DIAGNOSIS — R519 Headache, unspecified: Secondary | ICD-10-CM | POA: Insufficient documentation

## 2020-02-20 DIAGNOSIS — J45909 Unspecified asthma, uncomplicated: Secondary | ICD-10-CM | POA: Insufficient documentation

## 2020-02-20 DIAGNOSIS — F17210 Nicotine dependence, cigarettes, uncomplicated: Secondary | ICD-10-CM | POA: Insufficient documentation

## 2020-02-20 DIAGNOSIS — F909 Attention-deficit hyperactivity disorder, unspecified type: Secondary | ICD-10-CM | POA: Insufficient documentation

## 2020-02-20 LAB — CBC WITH DIFFERENTIAL/PLATELET
Abs Immature Granulocytes: 0.05 10*3/uL (ref 0.00–0.07)
Basophils Absolute: 0.1 10*3/uL (ref 0.0–0.1)
Basophils Relative: 0 %
Eosinophils Absolute: 0.1 10*3/uL (ref 0.0–0.5)
Eosinophils Relative: 0 %
HCT: 47.9 % — ABNORMAL HIGH (ref 36.0–46.0)
Hemoglobin: 16.4 g/dL — ABNORMAL HIGH (ref 12.0–15.0)
Immature Granulocytes: 0 %
Lymphocytes Relative: 28 %
Lymphs Abs: 3.9 10*3/uL (ref 0.7–4.0)
MCH: 30.9 pg (ref 26.0–34.0)
MCHC: 34.2 g/dL (ref 30.0–36.0)
MCV: 90.4 fL (ref 80.0–100.0)
Monocytes Absolute: 0.6 10*3/uL (ref 0.1–1.0)
Monocytes Relative: 5 %
Neutro Abs: 9.5 10*3/uL — ABNORMAL HIGH (ref 1.7–7.7)
Neutrophils Relative %: 67 %
Platelets: 339 10*3/uL (ref 150–400)
RBC: 5.3 MIL/uL — ABNORMAL HIGH (ref 3.87–5.11)
RDW: 11.7 % (ref 11.5–15.5)
WBC: 14.2 10*3/uL — ABNORMAL HIGH (ref 4.0–10.5)
nRBC: 0 % (ref 0.0–0.2)

## 2020-02-20 LAB — BASIC METABOLIC PANEL
Anion gap: 12 (ref 5–15)
BUN: 16 mg/dL (ref 6–20)
CO2: 24 mmol/L (ref 22–32)
Calcium: 9.5 mg/dL (ref 8.9–10.3)
Chloride: 100 mmol/L (ref 98–111)
Creatinine, Ser: 0.76 mg/dL (ref 0.44–1.00)
GFR calc Af Amer: >60 mL/min (ref >60)
GFR calc non Af Amer: >60 mL/min (ref >60)
Glucose, Bld: 65 mg/dL — ABNORMAL LOW (ref 70–99)
Potassium: 3.8 mmol/L (ref 3.5–5.1)
Sodium: 136 mmol/L (ref 135–145)

## 2020-02-20 LAB — CBG MONITORING, ED: Glucose-Capillary: 142 mg/dL — ABNORMAL HIGH (ref 70–99)

## 2020-02-20 MED ORDER — PROMETHAZINE HCL 25 MG/ML IJ SOLN
25.0000 mg | Freq: Once | INTRAMUSCULAR | Status: AC
  Administered 2020-02-20: 25 mg via INTRAVENOUS
  Filled 2020-02-20: qty 1

## 2020-02-20 MED ORDER — DEXAMETHASONE SODIUM PHOSPHATE 4 MG/ML IJ SOLN
4.0000 mg | Freq: Once | INTRAMUSCULAR | Status: AC
  Administered 2020-02-20: 4 mg via INTRAVENOUS
  Filled 2020-02-20: qty 1

## 2020-02-20 MED ORDER — SODIUM CHLORIDE 0.9 % IV BOLUS
1000.0000 mL | Freq: Once | INTRAVENOUS | Status: AC
  Administered 2020-02-20: 1000 mL via INTRAVENOUS

## 2020-02-20 MED ORDER — KETOROLAC TROMETHAMINE 30 MG/ML IJ SOLN
30.0000 mg | Freq: Once | INTRAMUSCULAR | Status: AC
  Administered 2020-02-20: 30 mg via INTRAVENOUS
  Filled 2020-02-20: qty 1

## 2020-02-20 NOTE — ED Triage Notes (Signed)
Pt c/o migraine n/v since 330am-NAD-steady gait

## 2020-02-20 NOTE — Discharge Instructions (Signed)
Continue excedrin for headaches.   Stay hydrated   See your doctor for follow up   Return to ER if you have worse headaches, vomiting, weakness, fever, neck pain

## 2020-02-20 NOTE — ED Provider Notes (Signed)
[facility] EMERGENCY DEPARTMENT Provider Note   CSN: [id number] Arrival date & time: 02/20/20  1616     History Chief Complaint  Patient presents with  . Migraine    Amy Walls is a 39 y.o. female history of heroin abuse, ADHD, migraines here presenting with any headaches.  Patient states that she woke around 3 AM with severe headaches.  Patient states that she has history of migraines and took Excedrin with no relief.  She states that she has been vomiting unable to keep anything down.  She states that she feels dehydrated and weak all over.  Denies any focal weakness or trouble speaking.  Patient does have some photophobia but denies any neck pain or stiffness or fever.  The history is provided by the patient.       Past Medical History:  Diagnosis Date  . ADHD (attention deficit hyperactivity disorder)   . Anxiety   . Arthritis    "inflammation of all my joints"  . Asthma   . Bowel obstruction (HCC) 2007   "upper bowel"  . Chronic bronchitis    "yearly"  . Chronic mid back pain   . Complication of anesthesia 2007   "lost me on the table when I had upper bowel obstruction"  . Constipation, chronic   . Depression   . Fibromyalgia   . Hepatitis C   . Heroin abuse (HCC)   . Hypoglycemia    "my sugar runs low alot"  . IV drug user   . Kidney infection    "often"  . Migraines 12/28/11   "@ least once/wk"  . PONV (postoperative nausea and vomiting)   . Recurrent UTI     Patient Active Problem List   Diagnosis Date Noted  . Bipolar disorder (HCC) 02/20/2019  . Nausea vomiting and diarrhea 08/29/2014  . Intractable vomiting 08/29/2014  . Diarrhea 08/29/2014  . Chronic hepatitis C without hepatic coma (HCC) 2014  . Intussusception (HCC) 01/01/2012  . Acute back pain 12/28/2011  . Abdominal pain 12/28/2011  . Transaminitis 12/28/2011  . Asthma 12/28/2011  . IBS (irritable bowel syndrome) 12/28/2011  . Tobacco abuse 12/28/2011    Past Surgical  History:  Procedure Laterality Date  . BOWEL RESECTION  04/2006   "upper bowel"  . OVARY SURGERY  ~ 2002   "had 10 inches blood built up; they opened me up and got the blood out"     OB History   No obstetric history on file.     Family History  Problem Relation Age of Onset  . Breast cancer Maternal Grandmother   . Colon cancer Maternal Grandmother   . Irritable bowel syndrome Maternal Grandmother   . Diabetes Mother   . Heart disease Father   . Cancer Maternal Aunt        colon cancer   . Vision loss Maternal Grandfather     Social History   Tobacco Use  . Smoking status: Current Every Day Smoker    Packs/day: 0.50    Years: 20.00    Pack years: 10.00    Types: Cigarettes  . Smokeless tobacco: Never Used  Vaping Use  . Vaping Use: Never used  Substance Use Topics  . Alcohol use: Not Currently  . Drug use: Not Currently    Types: Cocaine, Heroin, Other-see comments    Home Medications Prior to Admission medications   Not on File    Allergies    Cinnamon, Levaquin [levofloxacin], Morphine and related, Penicillins, and  Acetaminophen  Review of Systems   Review of Systems  Neurological: Positive for headaches.  All other systems reviewed and are negative.   Physical Exam Updated Vital Signs BP 109/76 (BP Location: Left Arm)   Pulse (!) 57   Temp 98.2 F (36.8 C) (Oral)   Resp 16   Ht 4\' 10"  (1.473 m)   Wt 49.9 kg   SpO2 95%   BMI 22.99 kg/m   Physical Exam Vitals and nursing note reviewed.  Constitutional:      Comments: Uncomfortable   HENT:     Head: Normocephalic.     Mouth/Throat:     Mouth: Mucous membranes are moist.  Eyes:     Extraocular Movements: Extraocular movements intact.     Pupils: Pupils are equal, round, and reactive to light.  Neck:     Comments: No meningeal signs  Cardiovascular:     Rate and Rhythm: Normal rate and regular rhythm.     Pulses: Normal pulses.     Heart sounds: Normal heart sounds.  Pulmonary:      Effort: Pulmonary effort is normal.     Breath sounds: Normal breath sounds.  Abdominal:     General: Abdomen is flat.     Palpations: Abdomen is soft.  Musculoskeletal:        General: Normal range of motion.     Cervical back: Normal range of motion and neck supple.  Skin:    General: Skin is warm.     Capillary Refill: Capillary refill takes less than 2 seconds.  Neurological:     General: No focal deficit present.     Mental Status: She is oriented to person, place, and time.     Comments: CN 2- 12 intact, nl strength throughout, no pronator drift.   Psychiatric:        Mood and Affect: Mood normal.     ED Results / Procedures / Treatments   Labs (all labs ordered are listed, but only abnormal results are displayed) Labs Reviewed  CBC WITH DIFFERENTIAL/PLATELET - Abnormal; Notable for the following components:      Result Value   WBC 14.2 (*)    RBC 5.30 (*)    Hemoglobin 16.4 (*)    HCT 47.9 (*)    Neutro Abs 9.5 (*)    All other components within normal limits  BASIC METABOLIC PANEL - Abnormal; Notable for the following components:   Glucose, Bld 65 (*)    All other components within normal limits    EKG None  Radiology No results found.  Procedures Procedures (including critical care time)  Medications Ordered in ED Medications  sodium chloride 0.9 % bolus 1,000 mL (1,000 mLs Intravenous New Bag/Given 02/20/20 2031)  promethazine (PHENERGAN) injection 25 mg (25 mg Intravenous Given 02/20/20 2037)  dexamethasone (DECADRON) injection 4 mg (4 mg Intravenous Given 02/20/20 2037)  ketorolac (TORADOL) 30 MG/ML injection 30 mg (30 mg Intravenous Given 02/20/20 2037)    ED Course  I have reviewed the triage vital signs and the nursing notes.  Pertinent labs & imaging results that were available during my care of the patient were reviewed by me and considered in my medical decision making (see chart for details).    MDM Rules/Calculators/A&P                           Amy Walls is a 39 y.o. female here with headaches.  Patient has history of  migraines and this is just worse than usual.  Patient has no neuro deficits currently and have low suspicion for subarachnoid hemorrhage.  Patient had previous CT imaging and MRI that did not show any aneurysms.  Plan to get CBC, BMP.  Will give migraine cocktail and IV fluids.  9:54 PM Labs are unremarkable.  Patient tolerated p.o. and felt better after migraine cocktail.  Likely migraines.  Stable for discharge.  Final Clinical Impression(s) / ED Diagnoses Final diagnoses:  None    Rx / DC Orders ED Discharge Orders    None       Charlynne Pander, MD 02/20/20 2155

## 2020-04-07 ENCOUNTER — Encounter: Payer: Medicaid Other | Admitting: Family

## 2020-04-07 NOTE — Telephone Encounter (Signed)
Insurance verification information remains the same. ° °Suzann Lazaro E. Irfan Veal, CPhT °[facility] °[facility] °Phone: [phone] °Fax:  [phone] ° °

## 2020-05-27 ENCOUNTER — Ambulatory Visit: Payer: Medicaid Other | Admitting: Pharmacist

## 2020-06-10 ENCOUNTER — Emergency Department (HOSPITAL_BASED_OUTPATIENT_CLINIC_OR_DEPARTMENT_OTHER)
Admission: EM | Admit: 2020-06-10 | Discharge: 2020-06-10 | Disposition: A | Discharge status: Home / Self Care | Payer: Medicaid Other | Attending: Emergency Medicine | Admitting: Emergency Medicine

## 2020-06-10 ENCOUNTER — Other Ambulatory Visit: Payer: Self-pay

## 2020-06-10 DIAGNOSIS — F17210 Nicotine dependence, cigarettes, uncomplicated: Secondary | ICD-10-CM | POA: Insufficient documentation

## 2020-06-10 DIAGNOSIS — L02413 Cutaneous abscess of right upper limb: Secondary | ICD-10-CM | POA: Insufficient documentation

## 2020-06-10 DIAGNOSIS — J45909 Unspecified asthma, uncomplicated: Secondary | ICD-10-CM | POA: Insufficient documentation

## 2020-06-10 DIAGNOSIS — L0291 Cutaneous abscess, unspecified: Secondary | ICD-10-CM

## 2020-06-10 MED ORDER — DOXYCYCLINE HYCLATE 100 MG PO CAPS
100.0000 mg | ORAL_CAPSULE | Freq: Two times a day (BID) | ORAL | 0 refills | Status: DC
Start: 2020-06-10 — End: 2021-10-12

## 2020-06-10 NOTE — ED Provider Notes (Signed)
[facility] EMERGENCY DEPARTMENT Provider Note   CSN: [id number] Arrival date & time: 06/10/20  0720     History Chief Complaint  Patient presents with  . Abscess    Amy Walls is a 39 y.o. female.  She is here with complaint of a wound on her right forearm.  Denies any injury, denies IV drug use.  No fever.  Has tried nothing for it.  Thinks it might be due to bug bites staying at a hotel.  The history is provided by the patient.  Abscess Location:  Shoulder/arm Shoulder/arm abscess location:  R forearm Abscess quality: induration and painful  Draining: 3.   Red streaking: no   Progression:  Unchanged Pain details:    Quality:  Throbbing   Severity:  Moderate   Timing:  Constant   Progression:  Worsening Chronicity:  New Context: insect bite/sting (??)   Relieved by:  None tried Worsened by:  Nothing Ineffective treatments:  None tried Associated symptoms: no fever        Past Medical History:  Diagnosis Date  . ADHD (attention deficit hyperactivity disorder)   . Anxiety   . Arthritis    "inflammation of all my joints"  . Asthma   . Bowel obstruction (HCC) 2007   "upper bowel"  . Chronic bronchitis    "yearly"  . Chronic mid back pain   . Complication of anesthesia 2007   "lost me on the table when I had upper bowel obstruction"  . Constipation, chronic   . Depression   . Fibromyalgia   . Hepatitis C   . Heroin abuse (HCC)   . Hypoglycemia    "my sugar runs low alot"  . IV drug user   . Kidney infection    "often"  . Migraines 12/28/11   "@ least once/wk"  . PONV (postoperative nausea and vomiting)   . Recurrent UTI     Patient Active Problem List   Diagnosis Date Noted  . Bipolar disorder (HCC) 02/20/2019  . Nausea vomiting and diarrhea 08/29/2014  . Intractable vomiting 08/29/2014  . Diarrhea 08/29/2014  . Chronic hepatitis C without hepatic coma (HCC) 2014  . Intussusception (HCC) 01/01/2012  . Acute back pain 12/28/2011  .  Abdominal pain 12/28/2011  . Transaminitis 12/28/2011  . Asthma 12/28/2011  . IBS (irritable bowel syndrome) 12/28/2011  . Tobacco abuse 12/28/2011    Past Surgical History:  Procedure Laterality Date  . BOWEL RESECTION  04/2006   "upper bowel"  . OVARY SURGERY  ~ 2002   "had 10 inches blood built up; they opened me up and got the blood out"     OB History   No obstetric history on file.     Family History  Problem Relation Age of Onset  . Breast cancer Maternal Grandmother   . Colon cancer Maternal Grandmother   . Irritable bowel syndrome Maternal Grandmother   . Diabetes Mother   . Heart disease Father   . Cancer Maternal Aunt        colon cancer   . Vision loss Maternal Grandfather     Social History   Tobacco Use  . Smoking status: Current Every Day Smoker    Packs/day: 0.50    Years: 20.00    Pack years: 10.00    Types: Cigarettes  . Smokeless tobacco: Never Used  Vaping Use  . Vaping Use: Never used  Substance Use Topics  . Alcohol use: Not Currently  . Drug use: Not  Currently    Types: Cocaine, Heroin, Other-see comments    Home Medications Prior to Admission medications   Not on File    Allergies    Cinnamon, Levaquin [levofloxacin], Morphine and related, Penicillins, and Acetaminophen  Review of Systems   Review of Systems  Constitutional: Negative for fever.  Skin: Positive for wound.    Physical Exam Updated Vital Signs BP 124/89 (BP Location: Left Arm)   Pulse 92   Temp 98.6 F (37 C) (Oral)   Resp 16   Ht 4' 10.5" (1.486 m)   Wt 56.7 kg   SpO2 100%   BMI 25.68 kg/m   Physical Exam Constitutional:      Appearance: Normal appearance. She is well-developed.  HENT:     Head: Normocephalic and atraumatic.  Eyes:     Conjunctiva/sclera: Conjunctivae normal.  Musculoskeletal:        General: Tenderness present.     Cervical back: Neck supple.     Comments: Right forearm is approximately 3 cm area of ulceration with some  necrotic material in the center.  Multiple other thrombosed areas over her antecubital veins.  No surrounding erythema.  Skin:    General: Skin is warm and dry.     Capillary Refill: Capillary refill takes less than 2 seconds.  Neurological:     General: No focal deficit present.     Mental Status: She is alert.     GCS: GCS eye subscore is 4. GCS verbal subscore is 5. GCS motor subscore is 6.       ED Results / Procedures / Treatments   Labs (all labs ordered are listed, but only abnormal results are displayed) Labs Reviewed - No data to display  EKG None  Radiology No results found.  Procedures Procedures (including critical care time)  Medications Ordered in ED Medications - No data to display  ED Course  I have reviewed the triage vital signs and the nursing notes.  Pertinent labs & imaging results that were available during my care of the patient were reviewed by me and considered in my medical decision making (see chart for details).    MDM Rules/Calculators/A&P                          39 year old female here for evaluation of right forearm abscess.  She has a ulceration approximately 3 cm with some thickened eschar in the center.  There is no significant induration around the wound and no fluctuance.  No proximal streaking.  Likely ruptured treat her abscess although she denies IV drug use.  Not systemically toxic.  Do not think she would benefit from an I&D.  Will treat with antibiotics and dressing changes.  Return instructions discussed. Final Clinical Impression(s) / ED Diagnoses Final diagnoses:  Abscess    Rx / DC Orders ED Discharge Orders         Ordered    doxycycline (VIBRAMYCIN) 100 MG capsule  2 times daily        06/10/20 0816           Terrilee Files, MD 06/10/20 1702

## 2020-06-10 NOTE — Discharge Instructions (Addendum)
You were seen in the emergency department for evaluation of a wound on your right forearm.  You had signs of an ulceration without an area that would need to be incised and drained.  We are putting you on antibiotics.  Please use soap and water on the area with some gentle cleansing.  Finish your antibiotics.  Return the emergency department if any high fever or worsening symptoms.

## 2020-06-10 NOTE — ED Triage Notes (Signed)
Pt arrives pov with family member with c/o abscess to medial distal RUE. Redness and swelling noted. Multiple sores noted to right and left upper extremities. PT endorses staying in motel, endorses concern for insect bites.

## 2020-06-10 NOTE — ED Notes (Signed)
Review D/C papers with pt, reviewed Rx with pt, pt states understanding, pt denies questions at this time. 

## 2021-03-24 ENCOUNTER — Emergency Department (HOSPITAL_BASED_OUTPATIENT_CLINIC_OR_DEPARTMENT_OTHER): Payer: Self-pay

## 2021-03-24 ENCOUNTER — Other Ambulatory Visit: Payer: Self-pay

## 2021-03-24 ENCOUNTER — Ambulatory Visit: Payer: Medicaid Other | Attending: Internal Medicine

## 2021-03-24 ENCOUNTER — Other Ambulatory Visit (HOSPITAL_BASED_OUTPATIENT_CLINIC_OR_DEPARTMENT_OTHER): Payer: Self-pay

## 2021-03-24 ENCOUNTER — Encounter (HOSPITAL_BASED_OUTPATIENT_CLINIC_OR_DEPARTMENT_OTHER): Payer: Self-pay | Admitting: Emergency Medicine

## 2021-03-24 ENCOUNTER — Emergency Department (HOSPITAL_BASED_OUTPATIENT_CLINIC_OR_DEPARTMENT_OTHER)
Admission: EM | Admit: 2021-03-24 | Discharge: 2021-03-24 | Disposition: A | Discharge status: Home / Self Care | Payer: Self-pay | Attending: Emergency Medicine | Admitting: Emergency Medicine

## 2021-03-24 DIAGNOSIS — R059 Cough, unspecified: Secondary | ICD-10-CM | POA: Insufficient documentation

## 2021-03-24 DIAGNOSIS — Z20822 Contact with and (suspected) exposure to covid-19: Secondary | ICD-10-CM | POA: Insufficient documentation

## 2021-03-24 DIAGNOSIS — Z23 Encounter for immunization: Secondary | ICD-10-CM

## 2021-03-24 DIAGNOSIS — R0602 Shortness of breath: Secondary | ICD-10-CM | POA: Insufficient documentation

## 2021-03-24 DIAGNOSIS — M791 Myalgia, unspecified site: Secondary | ICD-10-CM | POA: Insufficient documentation

## 2021-03-24 DIAGNOSIS — J45909 Unspecified asthma, uncomplicated: Secondary | ICD-10-CM | POA: Insufficient documentation

## 2021-03-24 DIAGNOSIS — Z87891 Personal history of nicotine dependence: Secondary | ICD-10-CM | POA: Insufficient documentation

## 2021-03-24 DIAGNOSIS — R8279 Other abnormal findings on microbiological examination of urine: Secondary | ICD-10-CM | POA: Insufficient documentation

## 2021-03-24 DIAGNOSIS — N12 Tubulo-interstitial nephritis, not specified as acute or chronic: Secondary | ICD-10-CM

## 2021-03-24 DIAGNOSIS — R0981 Nasal congestion: Secondary | ICD-10-CM | POA: Insufficient documentation

## 2021-03-24 LAB — CBC WITH DIFFERENTIAL/PLATELET
Abs Immature Granulocytes: 0.11 10*3/uL — ABNORMAL HIGH (ref 0.00–0.07)
Basophils Absolute: 0.1 10*3/uL (ref 0.0–0.1)
Basophils Relative: 0 %
Eosinophils Absolute: 0.1 10*3/uL (ref 0.0–0.5)
Eosinophils Relative: 1 %
HCT: 46 % (ref 36.0–46.0)
Hemoglobin: 16.2 g/dL — ABNORMAL HIGH (ref 12.0–15.0)
Immature Granulocytes: 1 %
Lymphocytes Relative: 33 %
Lymphs Abs: 5.5 10*3/uL — ABNORMAL HIGH (ref 0.7–4.0)
MCH: 30.6 pg (ref 26.0–34.0)
MCHC: 35.2 g/dL (ref 30.0–36.0)
MCV: 86.8 fL (ref 80.0–100.0)
Monocytes Absolute: 1.1 10*3/uL — ABNORMAL HIGH (ref 0.1–1.0)
Monocytes Relative: 7 %
Neutro Abs: 10 10*3/uL — ABNORMAL HIGH (ref 1.7–7.7)
Neutrophils Relative %: 58 %
Platelets: 366 10*3/uL (ref 150–400)
RBC: 5.3 MIL/uL — ABNORMAL HIGH (ref 3.87–5.11)
RDW: 12.5 % (ref 11.5–15.5)
WBC: 17.1 10*3/uL — ABNORMAL HIGH (ref 4.0–10.5)
nRBC: 0 % (ref 0.0–0.2)

## 2021-03-24 LAB — COMPREHENSIVE METABOLIC PANEL
ALT: 41 U/L (ref 0–44)
AST: 64 U/L — ABNORMAL HIGH (ref 15–41)
Albumin: 3.9 g/dL (ref 3.5–5.0)
Alkaline Phosphatase: 75 U/L (ref 38–126)
Anion gap: 9 (ref 5–15)
BUN: 15 mg/dL (ref 6–20)
CO2: 25 mmol/L (ref 22–32)
Calcium: 8.8 mg/dL — ABNORMAL LOW (ref 8.9–10.3)
Chloride: 105 mmol/L (ref 98–111)
Creatinine, Ser: 0.83 mg/dL (ref 0.44–1.00)
GFR, Estimated: >60 mL/min (ref >60)
Glucose, Bld: 92 mg/dL (ref 70–99)
Potassium: 3.7 mmol/L (ref 3.5–5.1)
Sodium: 139 mmol/L (ref 135–145)
Total Bilirubin: 0.3 mg/dL (ref 0.3–1.2)
Total Protein: 6.8 g/dL (ref 6.5–8.1)

## 2021-03-24 LAB — RESP PANEL BY RT-PCR (FLU A&B, COVID) ARPGX2
Influenza A by PCR: NEGATIVE
Influenza B by PCR: NEGATIVE
SARS Coronavirus 2 by RT PCR: NEGATIVE

## 2021-03-24 LAB — URINALYSIS, ROUTINE W REFLEX MICROSCOPIC
Bilirubin Urine: NEGATIVE
Glucose, UA: NEGATIVE mg/dL
Ketones, ur: NEGATIVE mg/dL
Nitrite: NEGATIVE
Protein, ur: NEGATIVE mg/dL
Specific Gravity, Urine: 1.02 (ref 1.005–1.030)
pH: 6 (ref 5.0–8.0)

## 2021-03-24 LAB — URINALYSIS, MICROSCOPIC (REFLEX)

## 2021-03-24 LAB — PREGNANCY, URINE: Preg Test, Ur: NEGATIVE

## 2021-03-24 MED ORDER — KETOROLAC TROMETHAMINE 15 MG/ML IJ SOLN
15.0000 mg | Freq: Once | INTRAMUSCULAR | Status: AC
  Administered 2021-03-24: 15 mg via INTRAVENOUS
  Filled 2021-03-24: qty 1

## 2021-03-24 MED ORDER — SODIUM CHLORIDE 0.9 % IV BOLUS
1000.0000 mL | Freq: Once | INTRAVENOUS | Status: AC
  Administered 2021-03-24: 1000 mL via INTRAVENOUS

## 2021-03-24 MED ORDER — DIPHENHYDRAMINE HCL 50 MG/ML IJ SOLN
25.0000 mg | Freq: Once | INTRAMUSCULAR | Status: AC
  Administered 2021-03-24: 25 mg via INTRAVENOUS
  Filled 2021-03-24: qty 1

## 2021-03-24 MED ORDER — IBUPROFEN 400 MG PO TABS: 400.0000 mg | ORAL_TABLET | Freq: Once | ORAL | Status: DC

## 2021-03-24 MED ORDER — CEPHALEXIN 500 MG PO CAPS
500.0000 mg | ORAL_CAPSULE | Freq: Four times a day (QID) | ORAL | 0 refills | Status: AC
  Filled 2021-03-24: qty 40, 10d supply, fill #0

## 2021-03-24 MED ORDER — PROMETHAZINE HCL 25 MG PO TABS
25.0000 mg | ORAL_TABLET | Freq: Four times a day (QID) | ORAL | 0 refills | Status: DC | PRN
Start: 2021-03-24 — End: 2023-10-18
  Filled 2021-03-24: qty 15, 4d supply, fill #0

## 2021-03-24 MED ORDER — METOCLOPRAMIDE HCL 5 MG/ML IJ SOLN
10.0000 mg | Freq: Once | INTRAMUSCULAR | Status: AC
  Administered 2021-03-24: 10 mg via INTRAVENOUS
  Filled 2021-03-24: qty 2

## 2021-03-24 NOTE — ED Provider Notes (Signed)
[facility] EMERGENCY DEPARTMENT Provider Note   CSN: [id number] Arrival date & time: 03/24/21  0932     History Chief Complaint  Patient presents with   Shortness of Breath    Amy Walls is a 40 y.o. female.  The history is provided by the patient.  Shortness of Breath Severity:  Mild Onset quality:  Gradual Duration:  3 days Timing:  Constant Progression:  Unchanged Chronicity:  New Context: URI   Relieved by:  Nothing Worsened by:  Nothing Ineffective treatments:  None tried Associated symptoms: cough   Associated symptoms: no fever, no rash, no sore throat, no sputum production, no syncope, no vomiting and no wheezing   Associated symptoms comment:  Body aches  Risk factors: no recent alcohol use       Past Medical History:  Diagnosis Date   ADHD (attention deficit hyperactivity disorder)    Anxiety    Arthritis    "inflammation of all my joints"   Asthma    Bowel obstruction (HCC) 2007   "upper bowel"   Chronic bronchitis    "yearly"   Chronic mid back pain    Complication of anesthesia 2007   "lost me on the table when I had upper bowel obstruction"   Constipation, chronic    Depression    Fibromyalgia    Hepatitis C    Heroin abuse (HCC)    Hypoglycemia    "my sugar runs low alot"   IV drug user    Kidney infection    "often"   Migraines 12/28/11   "@ least once/wk"   PONV (postoperative nausea and vomiting)    Recurrent UTI     Patient Active Problem List   Diagnosis Date Noted   Bipolar disorder (HCC) 02/20/2019   Nausea vomiting and diarrhea 08/29/2014   Intractable vomiting 08/29/2014   Diarrhea 08/29/2014   Chronic hepatitis C without hepatic coma (HCC) 2014   Intussusception (HCC) 01/01/2012   Acute back pain 12/28/2011   Abdominal pain 12/28/2011   Transaminitis 12/28/2011   Asthma 12/28/2011   IBS (irritable bowel syndrome) 12/28/2011   Tobacco abuse 12/28/2011    Past Surgical History:  Procedure Laterality Date    BOWEL RESECTION  04/2006   "upper bowel"   OVARY SURGERY  ~ 2002   "had 10 inches blood built up; they opened me up and got the blood out"     OB History   No obstetric history on file.     Family History  Problem Relation Age of Onset   Breast cancer Maternal Grandmother    Colon cancer Maternal Grandmother    Irritable bowel syndrome Maternal Grandmother    Diabetes Mother    Heart disease Father    Cancer Maternal Aunt        colon cancer    Vision loss Maternal Grandfather     Social History   Tobacco Use   Smoking status: Former    Packs/day: 0.50    Years: 20.00    Pack years: 10.00    Types: Cigarettes   Smokeless tobacco: Never  Vaping Use   Vaping Use: Never used  Substance Use Topics   Alcohol use: Not Currently   Drug use: Not Currently    Types: Cocaine, Heroin, Other-see comments    Home Medications Prior to Admission medications   Medication Sig Start Date End Date Taking? Authorizing Provider  doxycycline (VIBRAMYCIN) 100 MG capsule Take 1 capsule (100 mg total) by mouth 2 (two)  times daily. 06/10/20   Terrilee Files, MD    Allergies    Cinnamon, Levaquin [levofloxacin], Morphine and related, Penicillins, and Acetaminophen  Review of Systems   Review of Systems  Constitutional:  Negative for fever.  HENT:  Positive for congestion. Negative for sore throat.   Eyes:  Negative for redness.  Respiratory:  Positive for cough and shortness of breath. Negative for sputum production and wheezing.   Cardiovascular:  Negative for syncope.  Gastrointestinal:  Negative for vomiting.  Genitourinary:  Negative for difficulty urinating.  Musculoskeletal:  Positive for myalgias.  Skin:  Negative for rash.  Psychiatric/Behavioral:  Negative for agitation.   All other systems reviewed and are negative.  Physical Exam Updated Vital Signs BP (!) 126/91 (BP Location: Right Arm)   Pulse 100   Resp (!) 24   Ht 4' 11.5" (1.511 m)   Wt 52.2 kg   SpO2  98%   BMI 22.84 kg/m   Physical Exam Vitals and nursing note reviewed.  Constitutional:      General: She is not in acute distress.    Appearance: Normal appearance.  HENT:     Head: Normocephalic and atraumatic.     Nose: Nose normal.  Eyes:     Conjunctiva/sclera: Conjunctivae normal.     Pupils: Pupils are equal, round, and reactive to light.  Cardiovascular:     Rate and Rhythm: Normal rate and regular rhythm.     Pulses: Normal pulses.     Heart sounds: Normal heart sounds.  Pulmonary:     Effort: Pulmonary effort is normal.     Breath sounds: Normal breath sounds.  Abdominal:     General: Abdomen is flat. Bowel sounds are normal.     Palpations: Abdomen is soft.     Tenderness: There is no abdominal tenderness.  Musculoskeletal:        General: Normal range of motion.     Cervical back: Normal range of motion and neck supple.  Skin:    General: Skin is warm and dry.     Capillary Refill: Capillary refill takes less than 2 seconds.  Neurological:     General: No focal deficit present.     Mental Status: She is alert and oriented to person, place, and time.  Psychiatric:        Mood and Affect: Mood normal.        Behavior: Behavior normal.    ED Results / Procedures / Treatments   Labs (all labs ordered are listed, but only abnormal results are displayed) Labs Reviewed  RESP PANEL BY RT-PCR (FLU A&B, COVID) [id number]    EKG None  Radiology No results found.  Procedures Procedures   Medications Ordered in ED Medications - No data to display  ED Course  I have reviewed the triage vital signs and the nursing notes.  Pertinent labs & imaging results that were available during my care of the patient were reviewed by me and considered in my medical decision making (see chart for details).    MDM Rules/Calculators/A&P                           Cough and body aches and nasal congestion.  Symptoms are concerning for covid.  Swab sent.  Well appearing  stable for discharge.    Amy Walls was evaluated in Emergency Department on 03/24/2021 for the symptoms described in the history of present illness. She was evaluated in  the context of the global COVID-19 pandemic, which necessitated consideration that the patient might be at risk for infection with the SARS-CoV-2 virus that causes COVID-19. Institutional protocols and algorithms that pertain to the evaluation of patients at risk for COVID-19 are in a state of rapid change based on information released by regulatory bodies including the CDC and federal and state organizations. These policies and algorithms were followed during the patient's care in the ED.  Final Clinical Impression(s) / ED Diagnoses Final diagnoses:  Person under investigation for COVID-19     Return for intractable cough, coughing up blood, fevers > 100.4 unrelieved by medication, shortness of breath, intractable vomiting, chest pain, shortness of breath, weakness, numbness, changes in speech, facial asymmetry, abdominal pain, passing out, Inability to tolerate liquids or food, cough, altered mental status or any concerns. No signs of systemic illness or infection. The patient is nontoxic-appearing on exam and vital signs are within normal limits. I have reviewed the triage vital signs and the nursing notes. Pertinent labs & imaging results that were available during my care of the patient were reviewed by me and considered in my medical decision making (see chart for details). After history, exam, and medical workup I feel the patient has been appropriately medically screened and is safe for discharge home. Pertinent diagnoses were discussed with the patient. Patient was given return precautions. Rx / DC Orders ED Discharge Orders     None        Porchea Charrier, MD 03/24/21 7741

## 2021-03-24 NOTE — Discharge Instructions (Addendum)
If you develop high fever, severe cough or cough with blood, trouble breathing, severe headache, neck pain/stiffness, vomiting, or any other new/concerning symptoms then return to the ER for evaluation  

## 2021-03-24 NOTE — ED Triage Notes (Signed)
PT states SOB x 3 days and has developed Sx since. Headache, chills, cough, nasal congestion. Just released from [employer]. States there was mold and was in a 9 X 7 Cell with 3 other ppl.

## 2021-03-24 NOTE — Progress Notes (Signed)
   Covid-19 Vaccination Clinic  Name:  Amy Walls    MRN: [id number] DOB: 12/28/1980  03/24/2021  [name] was observed post Covid-19 immunization for 15 minutes without incident. She was provided with Vaccine Information Sheet and instruction to access the V-Safe system.   [name] was instructed to call 911 with any severe reactions post vaccine: Difficulty breathing  Swelling of face and throat  A fast heartbeat  A bad rash all over body  Dizziness and weakness   Immunizations Administered     Name Date Dose VIS Date Route   Moderna COVID-19 Vaccine 03/24/2021 12:03 PM 0.5 mL 06/11/2020 Intramuscular   Manufacturer: Moderna   Lot: [id number]   NDC: 33295-188-41

## 2021-03-24 NOTE — ED Provider Notes (Signed)
7:24 AM I was asked to see patient by nursing staff because she is complaining of being very dehydrated.  She is had some nonspecific symptoms like subjective fever and chills, cough, body aches.  She has a history of hep C as well as multiple other chronic issues like bronchitis, bipolar disorder.  On my exam she is slightly tachycardic in the low 100 range but not febrile.  Her lungs are clear.  We will get some blood work and give IV fluids for presumed dehydration as she states she has not been drinking much when she was in jail because I did not give her much water.  However overall I think she is suffering from a viral illness.  8:46 AM Patient states she has developed a recurrent migraine headache over past 15 minutes with nausea.  Similar to many prior migraine headaches.  We will give headache treatment.  11:29 AM UA questionable. Reports foul smell and nausea. Given elevated WBC, many bacteria (though maybe contaminated), will treat with antibiotics and send for culture. Previous culture results reviewed, shows prior E coli. Will treat with keflex.  Overall there is no murmur and no actual fever.  She does have an elevated WBC though has had significant WBC elevations in the past.  Given her history of IV drug abuse I will send for blood cultures but her symptoms are still pretty nonspecific and most likely a viral illness.  However given her complaints of urinary illness with some chills/nausea/body aches I think it be reasonable to treat with antibiotics to cover urine source.  However I think she is stable for discharge home and have a pretty low suspicion for bacterial pneumonia, meningitis, etc.   Pricilla Loveless, MD 03/24/21 1146

## 2021-03-25 ENCOUNTER — Other Ambulatory Visit (HOSPITAL_BASED_OUTPATIENT_CLINIC_OR_DEPARTMENT_OTHER): Payer: Self-pay

## 2021-03-25 LAB — URINE CULTURE: Culture: <10000 — AB

## 2021-03-26 ENCOUNTER — Other Ambulatory Visit (HOSPITAL_BASED_OUTPATIENT_CLINIC_OR_DEPARTMENT_OTHER): Payer: Self-pay

## 2021-03-26 MED ORDER — COVID-19 MRNA VACC (MODERNA) 100 MCG/0.5ML IM SUSP
INTRAMUSCULAR | 0 refills | Status: DC
  Filled 2021-03-26: qty 0.5, 1d supply, fill #0

## 2021-03-28 ENCOUNTER — Telehealth (HOSPITAL_BASED_OUTPATIENT_CLINIC_OR_DEPARTMENT_OTHER): Payer: Self-pay | Admitting: Emergency Medicine

## 2021-03-29 LAB — CULTURE, BLOOD (ROUTINE X 2): Culture: NO GROWTH

## 2021-03-30 LAB — CULTURE, BLOOD (ROUTINE X 2)

## 2021-03-31 ENCOUNTER — Telehealth: Payer: Self-pay

## 2021-03-31 NOTE — Telephone Encounter (Signed)
Post ED Visit - Positive Culture Follow-up  Culture report reviewed by antimicrobial stewardship pharmacist: [facility] []  , Pharm.D. []  Enzo Bi, Pharm.D., BCPS AQ-ID []  , Pharm.D., BCPS []  Celedonio Miyamoto, .D., BCPS []  [place], .D., BCPS, AAHIVP []  Georgina Pillion, Pharm.D., BCPS, AAHIVP []  [address], PharmD, BCPS []  , PharmD, BCPS []  [place], PharmD, BCPS []  [address], PharmD []  , PharmD, BCPS []  Estella Husk, PharmD  Pharmacy Team []  Lysle Pearl, PharmD []  , PharmD []  Phillips Climes, PharmD []  , Rph []  Agapito Games) , PharmD []  Verlan Friends, PharmD []  , PharmD []  Mervyn Gay, PharmD []  , PharmD []  Vinnie Level, PharmD []  [facility], PharmD []  , PharmD []  Len Childs, PharmD  Blood cultures with likely contaminant, no antibiotics indicated for blood cultures. Pt discharged with Cephalexin for possible UTI. No further patient follow-up is required at this time per Dr. , DO  Keontae Levingston [place] 03/31/2021, 9:26 AM

## 2021-10-12 ENCOUNTER — Emergency Department (HOSPITAL_COMMUNITY)
Admission: EM | Admit: 2021-10-12 | Discharge: 2021-10-12 | Disposition: A | Discharge status: Home / Self Care | Payer: Medicaid Other | Attending: Emergency Medicine | Admitting: Emergency Medicine

## 2021-10-12 ENCOUNTER — Other Ambulatory Visit: Payer: Self-pay

## 2021-10-12 ENCOUNTER — Encounter (HOSPITAL_COMMUNITY): Payer: Self-pay

## 2021-10-12 DIAGNOSIS — D72829 Elevated white blood cell count, unspecified: Secondary | ICD-10-CM | POA: Insufficient documentation

## 2021-10-12 DIAGNOSIS — R6889 Other general symptoms and signs: Secondary | ICD-10-CM

## 2021-10-12 DIAGNOSIS — R718 Other abnormality of red blood cells: Secondary | ICD-10-CM | POA: Insufficient documentation

## 2021-10-12 DIAGNOSIS — D696 Thrombocytopenia, unspecified: Secondary | ICD-10-CM | POA: Insufficient documentation

## 2021-10-12 DIAGNOSIS — J101 Influenza due to other identified influenza virus with other respiratory manifestations: Secondary | ICD-10-CM | POA: Insufficient documentation

## 2021-10-12 DIAGNOSIS — Z20822 Contact with and (suspected) exposure to covid-19: Secondary | ICD-10-CM | POA: Insufficient documentation

## 2021-10-12 LAB — COMPREHENSIVE METABOLIC PANEL
ALT: 53 U/L — ABNORMAL HIGH (ref 0–44)
AST: 92 U/L — ABNORMAL HIGH (ref 15–41)
Albumin: 4.6 g/dL (ref 3.5–5.0)
Alkaline Phosphatase: 72 U/L (ref 38–126)
Anion gap: 13 (ref 5–15)
BUN: 17 mg/dL (ref 6–20)
CO2: 17 mmol/L — ABNORMAL LOW (ref 22–32)
Calcium: 10 mg/dL (ref 8.9–10.3)
Chloride: 106 mmol/L (ref 98–111)
Creatinine, Ser: 0.61 mg/dL (ref 0.44–1.00)
GFR, Estimated: >60 mL/min (ref >60)
Glucose, Bld: 129 mg/dL — ABNORMAL HIGH (ref 70–99)
Potassium: 3.8 mmol/L (ref 3.5–5.1)
Sodium: 136 mmol/L (ref 135–145)
Total Bilirubin: 1.4 mg/dL — ABNORMAL HIGH (ref 0.3–1.2)
Total Protein: 8.4 g/dL — ABNORMAL HIGH (ref 6.5–8.1)

## 2021-10-12 LAB — URINALYSIS, ROUTINE W REFLEX MICROSCOPIC
Bilirubin Urine: NEGATIVE
Glucose, UA: NEGATIVE mg/dL
Hgb urine dipstick: NEGATIVE
Ketones, ur: 20 mg/dL — AB
Leukocytes,Ua: NEGATIVE
Nitrite: NEGATIVE
Protein, ur: 30 mg/dL — AB
Specific Gravity, Urine: 1.026 (ref 1.005–1.030)
pH: 5 (ref 5.0–8.0)

## 2021-10-12 LAB — CBC WITH DIFFERENTIAL/PLATELET
Band Neutrophils: 2 %
Basophils Absolute: 0 10*3/uL (ref 0.0–0.1)
Basophils Relative: 0 %
Eosinophils Absolute: 0 10*3/uL (ref 0.0–0.5)
Eosinophils Relative: 0 %
HCT: 50.1 % — ABNORMAL HIGH (ref 36.0–46.0)
Hemoglobin: 17.4 g/dL — ABNORMAL HIGH (ref 12.0–15.0)
Lymphocytes Relative: 16 %
Lymphs Abs: 2.3 10*3/uL (ref 0.7–4.0)
MCH: 30.8 pg (ref 26.0–34.0)
MCHC: 34.7 g/dL (ref 30.0–36.0)
MCV: 88.7 fL (ref 80.0–100.0)
Monocytes Absolute: 0 10*3/uL — ABNORMAL LOW (ref 0.1–1.0)
Monocytes Relative: 0 %
Neutro Abs: 12.1 10*3/uL — ABNORMAL HIGH (ref 1.7–7.7)
Neutrophils Relative %: 82 %
Platelets: 60 10*3/uL — ABNORMAL LOW (ref 150–400)
RBC: 5.65 MIL/uL — ABNORMAL HIGH (ref 3.87–5.11)
RDW: 11.8 % (ref 11.5–15.5)
WBC Morphology: REACTIVE
WBC: 14.4 10*3/uL — ABNORMAL HIGH (ref 4.0–10.5)
nRBC: 0 % (ref 0.0–0.2)
nRBC: 0 /100 WBC

## 2021-10-12 LAB — RESP PANEL BY RT-PCR (FLU A&B, COVID) ARPGX2
Influenza A by PCR: NEGATIVE
Influenza B by PCR: NEGATIVE
SARS Coronavirus 2 by RT PCR: NEGATIVE

## 2021-10-12 LAB — PREGNANCY, URINE: Preg Test, Ur: NEGATIVE

## 2021-10-12 LAB — HCG, QUANTITATIVE, PREGNANCY: hCG, Beta Chain, Quant, S: 9 m[IU]/mL — ABNORMAL HIGH (ref <5)

## 2021-10-12 MED ORDER — IBUPROFEN 200 MG PO TABS
600.0000 mg | ORAL_TABLET | Freq: Once | ORAL | Status: AC
  Administered 2021-10-12: 600 mg via ORAL
  Filled 2021-10-12: qty 3

## 2021-10-12 MED ORDER — MORPHINE SULFATE (PF) 2 MG/ML IV SOLN
2.0000 mg | Freq: Once | INTRAVENOUS | Status: DC
  Filled 2021-10-12: qty 1

## 2021-10-12 MED ORDER — SODIUM CHLORIDE 0.9 % IV BOLUS
1000.0000 mL | Freq: Once | INTRAVENOUS | Status: AC
  Administered 2021-10-12: 1000 mL via INTRAVENOUS

## 2021-10-12 MED ORDER — DIPHENHYDRAMINE HCL 50 MG/ML IJ SOLN
25.0000 mg | Freq: Once | INTRAMUSCULAR | Status: DC
  Filled 2021-10-12: qty 1

## 2021-10-12 MED ORDER — ONDANSETRON HCL 4 MG/2ML IJ SOLN
4.0000 mg | Freq: Once | INTRAMUSCULAR | Status: AC
  Administered 2021-10-12: 4 mg via INTRAVENOUS
  Filled 2021-10-12: qty 2

## 2021-10-12 MED ORDER — KETOROLAC TROMETHAMINE 15 MG/ML IJ SOLN
15.0000 mg | Freq: Once | INTRAMUSCULAR | Status: DC
  Filled 2021-10-12: qty 1

## 2021-10-12 NOTE — ED Provider Notes (Signed)
Accepted handoff at shift change from [facility]. Please see prior provider note for more detail.   Briefly: Patient is 41 y.o.   DDX: concern for general malaise, URI sx, positive covid test  Plan: fluids, urine and recheck -- will need follow up with hematology  On reevaluation patient feels no better, complains of right flank pain.  Reports history of kidney stone x1.  Although her urine does not show any blood, she does not have any dysuria, or gross hematuria we will evaluate with CT stone study to try for risk for kidney infection versus other right flank pathology.  We will administer more pain medication.  Prior to receiving repeat pain medication, or CT stone study patient requesting to be discharged.  She has been evaluated thoroughly overnight, and her work-up was significant for only flulike symptoms, general body aches, malaise feel that patient is stable for discharge.  She does have some focal right flank pain discussed that it is possible that she has a kidney stone even in absence of hematuria, however patient request to leave at this time.  Extensive return precautions given, and patient discharged in stable condition at this time.  She shows no evidence of peritonitis, or sepsis/SIRS.    Anselmo Pickler, PA-C 10/12/21 1002    Orpah Greek, MD 10/13/21 [phone]

## 2021-10-12 NOTE — ED Notes (Signed)
Pt requesting to be d/c'd. PA-C notified. Pt cleared for d/c at this time.

## 2021-10-12 NOTE — ED Provider Notes (Signed)
[facility]-EMERGENCY DEPT Provider Note   CSN: [id number] Arrival date & time: 10/12/21  0040     History  Chief Complaint  Patient presents with   Not Feeling Well    Covid Positive    Amy Walls is a 41 y.o. female.  41 year old female presents to the emergency department with complaints of fatigue and malaise.  She states that she has not felt well for 1 week, since testing positive for COVID-19.  Reports difficulty tolerating food and fluids due to nausea and vomiting.  She feels that she may be dehydrated.  She reports that her last episode of emesis was earlier this afternoon.  Notes associated diffuse myalgias without specific abdominal pain.  No fever.  She reports being vaccinated against COVID.  The history is provided by the patient. No language interpreter was used.      Home Medications Prior to Admission medications   Medication Sig Start Date End Date Taking? Authorizing Provider  COVID-19 mRNA vaccine, Moderna, 100 MCG/0.5ML injection Inject into the muscle. 03/24/21   Judyann Munson, MD  doxycycline (VIBRAMYCIN) 100 MG capsule Take 1 capsule (100 mg total) by mouth 2 (two) times daily. 06/10/20   Terrilee Files, MD  promethazine (PHENERGAN) 25 MG tablet Take 1 tablet (25 mg total) by mouth every 6 (six) hours as needed for nausea or vomiting. 03/24/21   Pricilla Loveless, MD      Allergies    Cinnamon, Levaquin [levofloxacin], Morphine and related, Penicillins, and Acetaminophen    Review of Systems   Review of Systems Ten systems reviewed and are negative for acute change, except as noted in the HPI.    Physical Exam Updated Vital Signs BP 120/79    Pulse 63    Temp 98.7 F (37.1 C) (Oral)    Resp 15    Ht 5' (1.524 m)    Wt 54.4 kg    SpO2 98%    BMI 23.44 kg/m   Physical Exam Vitals and nursing note reviewed.  Constitutional:      General: She is not in acute distress.    Appearance: She is well-developed. She is not  diaphoretic.     Comments: Nontoxic appearing and in NAD  HENT:     Head: Normocephalic and atraumatic.  Eyes:     General: No scleral icterus.    Conjunctiva/sclera: Conjunctivae normal.  Cardiovascular:     Rate and Rhythm: Normal rate and regular rhythm.     Pulses: Normal pulses.  Pulmonary:     Effort: Pulmonary effort is normal. No respiratory distress.     Comments: Respirations even and unlabored Abdominal:     Palpations: Abdomen is soft. There is no mass.     Tenderness: There is no guarding.     Comments: Soft, nondistended, nontender abdomen.  Musculoskeletal:        General: Normal range of motion.     Cervical back: Normal range of motion.  Skin:    General: Skin is warm and dry.     Coloration: Skin is not pale.     Findings: No erythema or rash.  Neurological:     Mental Status: She is alert and oriented to person, place, and time.     Coordination: Coordination normal.  Psychiatric:        Behavior: Behavior normal.    ED Results / Procedures / Treatments   Labs (all labs ordered are listed, but only abnormal results are displayed) Labs Reviewed  CBC WITH DIFFERENTIAL/PLATELET - Abnormal; Notable for the following components:      Result Value   WBC 14.4 (*)    RBC 5.65 (*)    Hemoglobin 17.4 (*)    HCT 50.1 (*)    Platelets 60 (*)    Neutro Abs 12.1 (*)    Monocytes Absolute 0.0 (*)    All other components within normal limits  COMPREHENSIVE METABOLIC PANEL - Abnormal; Notable for the following components:   CO2 17 (*)    Glucose, Bld 129 (*)    Total Protein 8.4 (*)    AST 92 (*)    ALT 53 (*)    Total Bilirubin 1.4 (*)    All other components within normal limits  HCG, QUANTITATIVE, PREGNANCY - Abnormal; Notable for the following components:   hCG, Beta Chain, Quant, S 9 (*)    All other components within normal limits  RESP PANEL BY RT-PCR (FLU A&B, COVID) ARPGX2  URINALYSIS, ROUTINE W REFLEX MICROSCOPIC  PREGNANCY, URINE     EKG None  Radiology No results found.  Procedures Procedures    Medications Ordered in ED Medications  ondansetron (ZOFRAN) injection 4 mg (4 mg Intravenous Given 10/12/21 0420)  sodium chloride 0.9 % bolus 1,000 mL (1,000 mLs Intravenous New Bag/Given 10/12/21 0425)  ibuprofen (ADVIL) tablet 600 mg (600 mg Oral Given 10/12/21 8657)    ED Course/ Medical Decision Making/ A&P Clinical Course as of 10/12/21 0651  Mon Oct 12, 2021  0214 Patient noted to have leukocytosis of 14.4 as well as elevated hemoglobin.  Based on historical labs and chart review, this appears chronic and stable.  She is ordered to receive IV fluids should there be a degree of hemoconcentration contributing to these laboratory abnormalities. [KH]  0215 Negative for COVID and influenza in the emergency department tonight. [KH]  0630 S/p fluids -- check her urine. Negative for COVID here -- complaining of fatigue / malaise. Chronic white count -- unchanged from previous.  [CP]    Clinical Course User Index [CP] Prosperi, Harrel Carina, PA-C [KH] Antony Madura, PA-C                           Medical Decision Making Amount and/or Complexity of Data Reviewed Labs: ordered.  Risk OTC drugs. Prescription drug management.   This patient presents to the ED for concern of flu-like symptoms, this involves an extensive number of treatment options, and is a complaint that carries with it a high risk of complications and morbidity.  The differential diagnosis includes COVID-19 vs influenza vs other viral illness vs dehydration vs UTI vs hyperglycemia vs hypoglycemia vs SIRS/sepsis.   Co morbidities that complicate the patient evaluation  Hx of hepatitis C Hx of heroin abuse/IVDU Fibromyalgia  Chronic pain   Additional history obtained:  External records from outside source obtained and reviewed including prior laboratory evaluation from 08/2021.   Lab Tests:  I Ordered, and personally interpreted labs.   The pertinent results include:  stable leukocytosis, stable H/H elevation to 17.4/50.1 today, new thrombocytopenia of 60, normal anion gap acidosis, chronic, fluctuating transaminitis (worse compared to 6 months ago, but improved compared to 2 years prior)   Cardiac Monitoring:  The patient was maintained on a cardiac monitor.  I personally viewed and interpreted the cardiac monitored which showed an underlying rhythm of: NSR   Medicines ordered and prescription drug management:  I ordered medication including IVF and Zofran for nausea  and concern for dehydration  Reevaluation of the patient after these medicines showed that the patient  has remained stable I have reviewed the patients home medicines and have made adjustments as needed   Social Determinants of Health:  Polysubstance abuse history Lack of established PCP follow up   Dispostion:  Patient presenting with reports of testing positive for COVID-19 last week.  She has a negative COVID test in the ED today.  She has been hemodynamically stable, afebrile.  Does not meet criteria for SIRS/sepsis despite leukocytosis.  Leukocytosis is also chronic and largely unchanged from baseline.  She is receiving IV fluids given complaints of nausea as well as persistent vomiting and inability to tolerate PO.  UA is also pending for further work up.  If urinalysis is reassuring and patient able to tolerate oral fluids, believe she may be appropriate for outpatient follow-up and reassessment with a limited course of antiemetics.  Ambulatory referral placed to hematology/oncology given her new thrombocytopenia.  Care signed out to Prosperi, PA-C at shift change.        Final Clinical Impression(s) / ED Diagnoses Final diagnoses:  Flu-like symptoms  Thrombocytopenia (HCC)    Rx / DC Orders ED Discharge Orders          Ordered    Ambulatory referral to Hematology / Oncology       Comments: Thrombocytopenia, new   10/12/21 0642               Antony Madura, PA-C 10/12/21 7209    Gilda Crease, MD 10/12/21 [phone]

## 2021-10-12 NOTE — Progress Notes (Signed)
Responded to consult for IV. On arrival noted [place] IV obtained by ED. Consult cleared.

## 2021-10-12 NOTE — ED Notes (Signed)
Pt ambulatory to the bathroom at this time with no difficulty.

## 2021-10-12 NOTE — Discharge Instructions (Signed)
Your symptoms are consistent with a viral upper respiratory infection.  You do not have COVID or the flu.  I encourage plenty of fluids, rest. You can use Tylenol for any fever, chills, ibuprofen for any body aches or headache. If you feel like you have sinus pressure congestion you can consider some over-the-counter saline nose spray, or a Nettie pot.  You can use over-the-counter Mucinex, or other cough and cold medication for congestion, sore throat, cough.  If your symptoms worsen, you develop significant shortness of breath or chest pain please return to the emergency department for further evaluation.  As you discussed with Tresa Endo please follow-up with hematologist in regards to your new decreased platelets.  If your symptoms worsen or fail to improve please return for further evaluation.

## 2021-10-12 NOTE — ED Triage Notes (Signed)
Pt BIB EMS with complaints of not feeling well after testing positive for Covid x 3 days ago.

## 2022-02-18 ENCOUNTER — Other Ambulatory Visit (HOSPITAL_BASED_OUTPATIENT_CLINIC_OR_DEPARTMENT_OTHER): Payer: Self-pay

## 2022-02-18 ENCOUNTER — Encounter (HOSPITAL_BASED_OUTPATIENT_CLINIC_OR_DEPARTMENT_OTHER): Payer: Self-pay | Admitting: Emergency Medicine

## 2022-02-18 ENCOUNTER — Other Ambulatory Visit: Payer: Self-pay

## 2022-02-18 ENCOUNTER — Emergency Department (HOSPITAL_BASED_OUTPATIENT_CLINIC_OR_DEPARTMENT_OTHER)
Admission: EM | Admit: 2022-02-18 | Discharge: 2022-02-18 | Disposition: A | Discharge status: Home / Self Care | Payer: Commercial Managed Care - HMO | Attending: Emergency Medicine | Admitting: Emergency Medicine

## 2022-02-18 DIAGNOSIS — J029 Acute pharyngitis, unspecified: Secondary | ICD-10-CM | POA: Insufficient documentation

## 2022-02-18 DIAGNOSIS — R0981 Nasal congestion: Secondary | ICD-10-CM | POA: Insufficient documentation

## 2022-02-18 DIAGNOSIS — J3489 Other specified disorders of nose and nasal sinuses: Secondary | ICD-10-CM | POA: Diagnosis not present

## 2022-02-18 DIAGNOSIS — M7989 Other specified soft tissue disorders: Secondary | ICD-10-CM | POA: Diagnosis not present

## 2022-02-18 DIAGNOSIS — R1032 Left lower quadrant pain: Secondary | ICD-10-CM | POA: Diagnosis present

## 2022-02-18 DIAGNOSIS — H9209 Otalgia, unspecified ear: Secondary | ICD-10-CM | POA: Insufficient documentation

## 2022-02-18 DIAGNOSIS — N730 Acute parametritis and pelvic cellulitis: Secondary | ICD-10-CM

## 2022-02-18 DIAGNOSIS — N739 Female pelvic inflammatory disease, unspecified: Secondary | ICD-10-CM | POA: Insufficient documentation

## 2022-02-18 LAB — PREGNANCY, URINE: Preg Test, Ur: NEGATIVE

## 2022-02-18 LAB — URINALYSIS, MICROSCOPIC (REFLEX)

## 2022-02-18 LAB — WET PREP, GENITAL
Sperm: NONE SEEN
Trich, Wet Prep: NONE SEEN
WBC, Wet Prep HPF POC: <10 (ref <10)
Yeast Wet Prep HPF POC: NONE SEEN

## 2022-02-18 LAB — URINALYSIS, ROUTINE W REFLEX MICROSCOPIC
Bilirubin Urine: NEGATIVE
Glucose, UA: NEGATIVE mg/dL
Hgb urine dipstick: NEGATIVE
Ketones, ur: NEGATIVE mg/dL
Leukocytes,Ua: NEGATIVE
Nitrite: NEGATIVE
Protein, ur: NEGATIVE mg/dL
Specific Gravity, Urine: >=1.03 (ref 1.005–1.030)
pH: 5.5 (ref 5.0–8.0)

## 2022-02-18 MED ORDER — IBUPROFEN 800 MG PO TABS
800.0000 mg | ORAL_TABLET | Freq: Once | ORAL | Status: AC
  Administered 2022-02-18: 800 mg via ORAL
  Filled 2022-02-18: qty 1

## 2022-02-18 MED ORDER — METRONIDAZOLE 500 MG PO TABS: 500.0000 mg | ORAL_TABLET | Freq: Two times a day (BID) | ORAL | 0 refills | Status: DC

## 2022-02-18 MED ORDER — METRONIDAZOLE 500 MG PO TABS
500.0000 mg | ORAL_TABLET | Freq: Two times a day (BID) | ORAL | 0 refills | Status: AC
  Filled 2022-02-18: qty 14, 7d supply, fill #0

## 2022-02-18 MED ORDER — CEFTRIAXONE SODIUM 500 MG IJ SOLR
500.0000 mg | Freq: Once | INTRAMUSCULAR | Status: AC
  Administered 2022-02-18: 500 mg via INTRAMUSCULAR
  Filled 2022-02-18: qty 500

## 2022-02-18 MED ORDER — FLUCONAZOLE 150 MG PO TABS
150.0000 mg | ORAL_TABLET | Freq: Once | ORAL | 0 refills | Status: AC
  Filled 2022-02-18: qty 1, 1d supply, fill #0

## 2022-02-18 MED ORDER — LIDOCAINE HCL (PF) 1 % IJ SOLN
1.0000 mL | Freq: Once | INTRAMUSCULAR | Status: AC
  Administered 2022-02-18: 2.1 mL
  Filled 2022-02-18: qty 5

## 2022-02-18 MED ORDER — ONDANSETRON HCL 4 MG PO TABS
4.0000 mg | ORAL_TABLET | Freq: Two times a day (BID) | ORAL | 0 refills | Status: AC | PRN
  Filled 2022-02-18: qty 14, 7d supply, fill #0

## 2022-02-18 MED ORDER — GENTAMICIN SULFATE 40 MG/ML IJ SOLN: 240.0000 mg | Freq: Once | INTRAMUSCULAR | Status: DC

## 2022-02-18 MED ORDER — AZITHROMYCIN 250 MG PO TABS
2000.0000 mg | ORAL_TABLET | Freq: Once | ORAL | Status: AC
  Administered 2022-02-18: 2000 mg via ORAL
  Filled 2022-02-18: qty 8

## 2022-02-18 NOTE — ED Notes (Signed)
Pt states unable to urinate at this time, urine spec cup at bedside

## 2022-02-18 NOTE — Discharge Instructions (Addendum)
Today you were seen in the ED for lower abdominal pain, urinary symptoms, and vaginal discharge.  We diagnosed you with pelvic inflammatory disease and bacterial vaginosis.  While in the ED, you received the medication to treat inflammatory disease.  You were given an injection of ceftriaxone 500 mg in your muscle.  You were also given a tablet of azithromycin 2000 mg one-time.  This double treatment should fully treat the pelvic inflammatory disease (PID).  To treat the bacterial vaginosis (BV) you need to take metronidazole (Flagyl) 500 mg tablet twice a day for 7 days.  I have sent this medication along with nausea medicine (Zofran) and a yeast infection medication (fluconazole) to use as needed if you develop a yeast infection.  Please return to the ED if your symptoms do not improve with this treatment.  You may follow-up the results of your gonorrhea and Chlamydia test on MyChart.  Please see the report on how to set up your MyChart.  If anything is positive, we should call you to let you know the results.  However, rest assured that you have already received the treatment for this (ceftriaxone and azithromycin).

## 2022-02-18 NOTE — ED Provider Notes (Signed)
[facility] EMERGENCY DEPARTMENT Provider Note   CSN: [id number] Arrival date & time: 02/18/22  1242  History  Chief Complaint  Patient presents with   Dysuria   Extremity Swelling   Amy Walls is a 41 y.o. female.  Patient is a 41 year old female who presents with several complaints, the main concern of which is 4 days duration of worsening suprapubic and LLQ abdominal pain, dysuria, and abnormal vaginal discharge.  She reports painful burning with urination, urinary frequency, malodorous urine, and thick brownish vaginal discharge.  No new sexual partners, reports she is not sexually active at this time.  Does not believe she is pregnant as she has a Nexplanon in place.  Does have a history of recurrent UTIs, never before on prophylactic antibiotics, and has not had UTI in a while.  She does have a past medical history of intussusception, IBS, chronic hep C without cirrhosis, and current tobacco use.  Second complaint is a couple days of rhinorrhea, congestion, mild sore throat, and ear fullness and pain.  She has been subjectively febrile, although has not measured temperature on thermometer.  No recent sick contacts.  Third complaint is 1 week of bilateral arm and leg swelling.  While she does have chronic hep C, I cannot find a diagnosis of cirrhosis in her chart or on imaging.   Home Medications Prior to Admission medications   Medication Sig Start Date End Date Taking? Authorizing Provider  fluconazole (DIFLUCAN) 150 MG tablet Take 1 tablet (150 mg total) by mouth once for 1 dose. 02/18/22 02/19/22 Yes Fayette Pho, MD  ondansetron (ZOFRAN) 4 MG tablet Take 1 tablet (4 mg total) by mouth 2 (two) times daily as needed for nausea or vomiting. 02/18/22 02/18/23 Yes Fayette Pho, MD  buprenorphine-naloxone (SUBOXONE) 8-2 mg SUBL SL tablet Place 1 tablet under the tongue 2 (two) times daily. 09/30/21   [provider]  metroNIDAZOLE (FLAGYL) 500 MG tablet Take 1  tablet (500 mg total) by mouth 2 (two) times daily for 7 days. 02/18/22 02/25/22  Fayette Pho, MD  naloxone [facility]) nasal spray 4 mg/0.1 mL Place 1 spray into the nose once as needed (opioid overdose).    [provider]  promethazine (PHENERGAN) 25 MG tablet Take 1 tablet (25 mg total) by mouth every 6 (six) hours as needed for nausea or vomiting. Patient not taking: Reported on 10/12/2021 03/24/21   Pricilla Loveless, MD     Allergies    Cinnamon, Levaquin [levofloxacin], Morphine and related, Penicillins, and Acetaminophen    Review of Systems   Review of Systems  Constitutional:  Negative for chills, diaphoresis, fatigue and fever.  HENT:  Positive for congestion, ear pain and sore throat. Negative for hearing loss, postnasal drip, sinus pressure, sinus pain and sneezing.   Respiratory:  Positive for wheezing. Negative for chest tightness and shortness of breath.   Cardiovascular:  Positive for leg swelling. Negative for chest pain and palpitations.  Gastrointestinal:  Positive for abdominal pain. Negative for constipation, nausea and vomiting.  Genitourinary:  Positive for difficulty urinating, frequency and urgency. Negative for hematuria.   Physical Exam Updated Vital Signs BP 91/62 (BP Location: Left Arm)   Pulse 61   Temp 97.8 F (36.6 C)   Resp 18   SpO2 97%  Physical Exam Vitals and nursing note reviewed.  Constitutional:      General: She is not in acute distress.    Appearance: Normal appearance. She is normal weight. She is not ill-appearing, toxic-appearing  or diaphoretic.  HENT:     Head: Normocephalic.     Right Ear: Tympanic membrane normal.     Left Ear: Tympanic membrane normal.     Ears:     Comments: Slightly erythematous external canal    Nose: Nose normal.     Mouth/Throat:     Mouth: Mucous membranes are moist.  Eyes:     General: No scleral icterus.    Conjunctiva/sclera: Conjunctivae normal.     Pupils: Pupils are equal, round, and reactive  to light.  Cardiovascular:     Rate and Rhythm: Normal rate and regular rhythm.     Pulses: Normal pulses.     Heart sounds: Normal heart sounds.  Pulmonary:     Effort: Pulmonary effort is normal.     Breath sounds: Normal breath sounds.  Abdominal:     General: Abdomen is flat. There is no distension.     Palpations: Abdomen is soft.     Tenderness: There is abdominal tenderness (suprapubic and LLQ). There is no guarding or rebound.  Genitourinary:    Cervix: Cervical motion tenderness and erythema present. No lesion or cervical bleeding.  Skin:    General: Skin is warm.     Capillary Refill: Capillary refill takes less than 2 seconds.  Neurological:     General: No focal deficit present.     Mental Status: She is alert and oriented to person, place, and time. Mental status is at baseline.    ED Results / Procedures / Treatments   Labs (all labs ordered are listed, but only abnormal results are displayed) Labs Reviewed  WET PREP, GENITAL - Abnormal; Notable for the following components:      Result Value   Clue Cells Wet Prep HPF POC PRESENT (*)    All other components within normal limits  URINALYSIS, ROUTINE W REFLEX MICROSCOPIC - Abnormal; Notable for the following components:   APPearance TURBID (*)    All other components within normal limits  URINALYSIS, MICROSCOPIC (REFLEX) - Abnormal; Notable for the following components:   Bacteria, UA MANY (*)    Non Squamous Epithelial PRESENT (*)    All other components within normal limits  PREGNANCY, URINE  GC/CHLAMYDIA PROBE AMP ([place]) NOT AT [facility]   EKG None  Radiology No results found.  Procedures Procedures   Medications Ordered in ED Medications  azithromycin (ZITHROMAX) tablet 2,000 mg (has no administration in time range)  cefTRIAXone (ROCEPHIN) injection 500 mg (has no administration in time range)  lidocaine (PF) (XYLOCAINE) 1 % injection 1-2.1 mL (has no administration in time range)  ibuprofen  (ADVIL) tablet 800 mg (800 mg Oral Given 02/18/22 1409)    ED Course/ Medical Decision Making/ A&P                           Medical Decision Making 41 year old female presents with 4 days of worsening LLQ and suprapubic tenderness and pain, dysuria, and abnormal vaginal discharge.  Differential includes UTI, yeast infection, BV, STI such as gonorrhea/chlamydia/trichomonas, PID, ovarian torsion.  UA negative for UTI.  Wet prep positive for BV, Rx metronidazole.  Given patient's nausea and subsequent yeast infection with metronidazole in the past, also prescribed Zofran and fluconazole.  ED vaginal exam concerning for PID given cervical motion tenderness, treated for PID with ceftriaxone and azithromycin prior to discharge.  Gonorrhea and Chlamydia results should take 24 to 48 hours, patient instructed on how to see results  in MyChart.  Will call patient if positive.  Other complaints:  URI symptoms likely early viral URI.  Given mild symptoms, elect for watchful waiting at this time.  No COVID or flu swabs today.  Conservative measures discussed, return precautions given, see AVS for more.  Bilateral arm and leg swelling, seems more subjective.  Physical exam today unremarkable.  Does have a history of hepatitis C without cirrhosis; less likely decompensated cirrhosis given this. Recommend follow-up with primary care physician.   Amount and/or Complexity of Data Reviewed Labs: ordered.    Details: UA, genital wet prep, vaginal GC/CH swab  Risk Prescription drug management.  Final Clinical Impression(s) / ED Diagnoses Final diagnoses:  PID (acute pelvic inflammatory disease)   Rx / DC Orders ED Discharge Orders          Ordered    metroNIDAZOLE (FLAGYL) 500 MG tablet  2 times daily,   Status:  Discontinued        02/18/22 1502    metroNIDAZOLE (FLAGYL) 500 MG tablet  2 times daily        02/18/22 1511    ondansetron (ZOFRAN) 4 MG tablet  2 times daily PRN        02/18/22 1511     fluconazole (DIFLUCAN) 150 MG tablet   Once        02/18/22 1511           Fayette Pho, MD   Fayette Pho, MD 02/18/22 1520    Edwin Dada P, DO 02/26/22 [phone]

## 2022-02-18 NOTE — ED Triage Notes (Signed)
Pt states she has been having dysuria x 4 days and extremity swelling x 2 weeks.  Admits to some pelvic pain.  Some vaginal discharge, creamy white color.  Some fever and chills.

## 2022-02-18 NOTE — ED Notes (Signed)
Pelvic cart to bedside 

## 2022-02-19 LAB — GC/CHLAMYDIA PROBE AMP (~~LOC~~) NOT AT ARMC
Chlamydia: NEGATIVE
Comment: NEGATIVE
Comment: NORMAL
Neisseria Gonorrhea: NEGATIVE

## 2022-02-20 ENCOUNTER — Encounter: Payer: Self-pay | Admitting: Family Medicine

## 2022-04-10 ENCOUNTER — Encounter (HOSPITAL_COMMUNITY): Payer: Self-pay

## 2022-04-10 ENCOUNTER — Other Ambulatory Visit: Payer: Self-pay

## 2022-04-10 ENCOUNTER — Emergency Department (HOSPITAL_COMMUNITY)
Admission: EM | Admit: 2022-04-10 | Discharge: 2022-04-10 | Disposition: A | Discharge status: Home / Self Care | Payer: Commercial Managed Care - HMO | Attending: Emergency Medicine | Admitting: Emergency Medicine

## 2022-04-10 DIAGNOSIS — F1910 Other psychoactive substance abuse, uncomplicated: Secondary | ICD-10-CM | POA: Diagnosis present

## 2022-04-10 DIAGNOSIS — R110 Nausea: Secondary | ICD-10-CM | POA: Diagnosis not present

## 2022-04-10 DIAGNOSIS — R109 Unspecified abdominal pain: Secondary | ICD-10-CM | POA: Insufficient documentation

## 2022-04-10 LAB — COMPREHENSIVE METABOLIC PANEL
ALT: 45 U/L — ABNORMAL HIGH (ref 0–44)
AST: 102 U/L — ABNORMAL HIGH (ref 15–41)
Albumin: 5.2 g/dL — ABNORMAL HIGH (ref 3.5–5.0)
Alkaline Phosphatase: 77 U/L (ref 38–126)
Anion gap: 15 (ref 5–15)
BUN: 27 mg/dL — ABNORMAL HIGH (ref 6–20)
CO2: 20 mmol/L — ABNORMAL LOW (ref 22–32)
Calcium: 10.7 mg/dL — ABNORMAL HIGH (ref 8.9–10.3)
Chloride: 103 mmol/L (ref 98–111)
Creatinine, Ser: 1 mg/dL (ref 0.44–1.00)
GFR, Estimated: >60 mL/min (ref >60)
Glucose, Bld: 88 mg/dL (ref 70–99)
Potassium: 4.5 mmol/L (ref 3.5–5.1)
Sodium: 138 mmol/L (ref 135–145)
Total Bilirubin: 1.6 mg/dL — ABNORMAL HIGH (ref 0.3–1.2)
Total Protein: 8.7 g/dL — ABNORMAL HIGH (ref 6.5–8.1)

## 2022-04-10 LAB — CBC
HCT: 54.7 % — ABNORMAL HIGH (ref 36.0–46.0)
Hemoglobin: 18 g/dL — ABNORMAL HIGH (ref 12.0–15.0)
MCH: 30.8 pg (ref 26.0–34.0)
MCHC: 32.9 g/dL (ref 30.0–36.0)
MCV: 93.7 fL (ref 80.0–100.0)
Platelets: 93 10*3/uL — ABNORMAL LOW (ref 150–400)
RBC: 5.84 MIL/uL — ABNORMAL HIGH (ref 3.87–5.11)
RDW: 12.2 % (ref 11.5–15.5)
WBC: 12.6 10*3/uL — ABNORMAL HIGH (ref 4.0–10.5)
nRBC: 0 % (ref 0.0–0.2)

## 2022-04-10 LAB — RAPID URINE DRUG SCREEN, HOSP PERFORMED
Amphetamines: POSITIVE — AB
Barbiturates: NOT DETECTED
Benzodiazepines: NOT DETECTED
Cocaine: POSITIVE — AB
Opiates: POSITIVE — AB
Tetrahydrocannabinol: NOT DETECTED

## 2022-04-10 LAB — I-STAT BETA HCG BLOOD, ED (MC, WL, AP ONLY): I-stat hCG, quantitative: <5 m[IU]/mL (ref <5)

## 2022-04-10 LAB — ETHANOL: Alcohol, Ethyl (B): <10 mg/dL (ref <10)

## 2022-04-10 MED ORDER — ONDANSETRON HCL 4 MG/2ML IJ SOLN
4.0000 mg | Freq: Once | INTRAMUSCULAR | Status: DC
  Filled 2022-04-10: qty 2

## 2022-04-10 MED ORDER — SODIUM CHLORIDE 0.9 % IV BOLUS: 1000.0000 mL | Freq: Once | INTRAVENOUS | Status: DC

## 2022-04-10 MED ORDER — CLONIDINE HCL 0.1 MG PO TABS
0.1000 mg | ORAL_TABLET | Freq: Once | ORAL | Status: AC
  Administered 2022-04-10: 0.1 mg via ORAL
  Filled 2022-04-10: qty 1

## 2022-04-10 MED ORDER — ONDANSETRON 4 MG PO TBDP
4.0000 mg | ORAL_TABLET | Freq: Once | ORAL | Status: AC
  Administered 2022-04-10: 4 mg via ORAL
  Filled 2022-04-10: qty 1

## 2022-04-10 MED ORDER — ALUM & MAG HYDROXIDE-SIMETH 200-200-20 MG/5ML PO SUSP
30.0000 mL | Freq: Once | ORAL | Status: AC
  Administered 2022-04-10: 30 mL via ORAL
  Filled 2022-04-10: qty 30

## 2022-04-10 MED ORDER — ONDANSETRON 4 MG PO TBDP: 4.0000 mg | ORAL_TABLET | Freq: Three times a day (TID) | ORAL | 0 refills | Status: DC | PRN

## 2022-04-10 MED ORDER — PROMETHAZINE HCL 25 MG PO TABS
25.0000 mg | ORAL_TABLET | Freq: Once | ORAL | Status: AC
  Administered 2022-04-10: 25 mg via ORAL
  Filled 2022-04-10: qty 1

## 2022-04-10 NOTE — ED Provider Notes (Signed)
[facility] EMERGENCY DEPARTMENT Provider Note   CSN: [id number] Arrival date & time: 04/10/22  1315     History  No chief complaint on file.   Amy Walls is a 41 y.o. female.  Pt reports she feels like she is in withdrawal.  Pt has been using heroin for the last 3 days.  Pt reports she has been on suboxone therapy and has been clean for a year.  Pt unsure what she may have used.  Pt complains of abdominal cramping and nausea.  Pt want to be in detox.  She called [facility] but they did not have any availability   The history is provided by the patient. No language interpreter was used.       Home Medications Prior to Admission medications   Medication Sig Start Date End Date Taking? Authorizing Provider  buprenorphine-naloxone (SUBOXONE) 8-2 mg SUBL SL tablet Place 1 tablet under the tongue 2 (two) times daily. 09/30/21   [provider]  naloxone [facility]) nasal spray 4 mg/0.1 mL Place 1 spray into the nose once as needed (opioid overdose).    [provider]  ondansetron (ZOFRAN) 4 MG tablet Take 1 tablet (4 mg total) by mouth 2 (two) times daily as needed for nausea or vomiting. 02/18/22 02/18/23  Fayette Pho, MD  promethazine (PHENERGAN) 25 MG tablet Take 1 tablet (25 mg total) by mouth every 6 (six) hours as needed for nausea or vomiting. Patient not taking: Reported on 10/12/2021 03/24/21   Pricilla Loveless, MD      Allergies    Cinnamon, Levaquin [levofloxacin], Morphine and related, Penicillins, and Acetaminophen    Review of Systems   Review of Systems  Gastrointestinal:  Positive for nausea and vomiting.  All other systems reviewed and are negative.   Physical Exam Updated Vital Signs BP 120/81 (BP Location: Right Arm)   Pulse 88   Temp 98.5 F (36.9 C) (Oral)   Resp 18   SpO2 99%  Physical Exam Vitals and nursing note reviewed.  Constitutional:      Appearance: She is well-developed.  HENT:     Head: Normocephalic.      Mouth/Throat:     Mouth: Mucous membranes are moist.  Eyes:     Pupils: Pupils are equal, round, and reactive to light.  Cardiovascular:     Rate and Rhythm: Normal rate.  Pulmonary:     Effort: Pulmonary effort is normal.  Abdominal:     General: There is no distension.  Musculoskeletal:        General: Normal range of motion.     Cervical back: Normal range of motion.  Skin:    General: Skin is warm.  Neurological:     General: No focal deficit present.     Mental Status: She is alert and oriented to person, place, and time.     ED Results / Procedures / Treatments   Labs (all labs ordered are listed, but only abnormal results are displayed) Labs Reviewed  COMPREHENSIVE METABOLIC PANEL - Abnormal; Notable for the following components:      Result Value   CO2 20 (*)    BUN 27 (*)    Calcium 10.7 (*)    Total Protein 8.7 (*)    Albumin 5.2 (*)    AST 102 (*)    ALT 45 (*)    Total Bilirubin 1.6 (*)    All other components within normal limits  CBC - Abnormal; Notable for the following  components:   WBC 12.6 (*)    RBC 5.84 (*)    Hemoglobin 18.0 (*)    HCT 54.7 (*)    Platelets 93 (*)    All other components within normal limits  RAPID URINE DRUG SCREEN, HOSP PERFORMED - Abnormal; Notable for the following components:   Opiates POSITIVE (*)    Cocaine POSITIVE (*)    Amphetamines POSITIVE (*)    All other components within normal limits  ETHANOL  I-STAT BETA HCG BLOOD, ED ([facility], [facility], [facility] ONLY)    EKG None  Radiology No results found.  Procedures Procedures    Medications Ordered in ED Medications  sodium chloride 0.9 % bolus 1,000 mL (has no administration in time range)  ondansetron (ZOFRAN) injection 4 mg (has no administration in time range)  cloNIDine (CATAPRES) tablet 0.1 mg (has no administration in time range)    ED Course/ Medical Decision Making/ A&P                           Medical Decision Making Pt concerned about withdrawal from  heroin.  Pt complains of nausea   Amount and/or Complexity of Data Reviewed External Data Reviewed: notes.    Details: no primary care records  ED notes only Labs: ordered. Decision-making details documented in ED Course.    Details: Ua is positive for opiates cocaine and amphetamine   labs ordered reviewed and interpreted. Discussion of management or test interpretation with external provider(s): I discussed pt with pharmacist.  Pharmacist reports pt should be able to resume suboxone without problem.    Risk OTC drugs. Prescription drug management. Risk Details: Pt has poor IV access.  Pt given zofran, clonidine and a gi cocktail.  Pt has not had any vomiting.  Pt observed.  Vital signs normal.  No sign of withdrawal.  Pt is interested in treatment programs.  Pt given resources.             Final Clinical Impression(s) / ED Diagnoses Final diagnoses:  Substance abuse (HCC)    Rx / DC Orders ED Discharge Orders     None     An After Visit Summary was printed and given to the patient.     Osie Cheeks 04/10/22 2042    Jacalyn Lefevre, MD 04/10/22 2308

## 2022-04-10 NOTE — ED Notes (Signed)
RN reviewed discharge instructions with pt. Pt verbalized understanding and had no further questions. VSS upon discharge.  

## 2022-04-10 NOTE — ED Notes (Signed)
Pt provided ice water 

## 2022-04-10 NOTE — ED Triage Notes (Signed)
Patient here requesting detox from heroin. Had been clean for a long time and relapsed 3 days ago. Complains of gastritis. Alert and oriented, no tremors, no vomiting

## 2022-04-10 NOTE — ED Notes (Signed)
IV team unable to obtain IV access. Sofia PA made aware, advised dc IV fluids and encourage PO fluids. Pt given water and juice, tolerated well

## 2022-04-10 NOTE — Discharge Instructions (Addendum)
Drink plenty of fluids.  You can safely start back on your suboxone.

## 2022-09-26 ENCOUNTER — Emergency Department (HOSPITAL_BASED_OUTPATIENT_CLINIC_OR_DEPARTMENT_OTHER): Payer: Commercial Managed Care - HMO

## 2022-09-26 ENCOUNTER — Other Ambulatory Visit: Payer: Self-pay

## 2022-09-26 ENCOUNTER — Encounter (HOSPITAL_BASED_OUTPATIENT_CLINIC_OR_DEPARTMENT_OTHER): Payer: Self-pay | Admitting: Emergency Medicine

## 2022-09-26 ENCOUNTER — Emergency Department (HOSPITAL_BASED_OUTPATIENT_CLINIC_OR_DEPARTMENT_OTHER)
Admission: EM | Admit: 2022-09-26 | Discharge: 2022-09-26 | Disposition: A | Discharge status: Home / Self Care | Payer: Commercial Managed Care - HMO | Attending: Emergency Medicine | Admitting: Emergency Medicine

## 2022-09-26 DIAGNOSIS — W19XXXA Unspecified fall, initial encounter: Secondary | ICD-10-CM

## 2022-09-26 DIAGNOSIS — W010XXA Fall on same level from slipping, tripping and stumbling without subsequent striking against object, initial encounter: Secondary | ICD-10-CM | POA: Insufficient documentation

## 2022-09-26 DIAGNOSIS — M25552 Pain in left hip: Secondary | ICD-10-CM | POA: Insufficient documentation

## 2022-09-26 DIAGNOSIS — M25562 Pain in left knee: Secondary | ICD-10-CM

## 2022-09-26 DIAGNOSIS — J45909 Unspecified asthma, uncomplicated: Secondary | ICD-10-CM | POA: Diagnosis not present

## 2022-09-26 MED ORDER — IBUPROFEN 800 MG PO TABS
800.0000 mg | ORAL_TABLET | Freq: Once | ORAL | Status: AC
  Administered 2022-09-26: 800 mg via ORAL
  Filled 2022-09-26: qty 1

## 2022-09-26 NOTE — ED Notes (Signed)
Pt denies urine sample, states she is "not sexually active so pregnancy is impossible"

## 2022-09-26 NOTE — Discharge Instructions (Addendum)
Please return to the ED with any new or worsening signs or symptoms Please follow-up with sports medicine.  Please call on Monday and make an appointment to be seen by Dr. Raeford Razor. Please utilize crutches provided You may use ibuprofen or Tylenol every 6 hours as needed for pain Please utilize RICE protocol tonight.  This stands for rest, ice, compression and elevation of your left extremity.  Please elevate your left foot.

## 2022-09-26 NOTE — ED Provider Notes (Signed)
[place] EMERGENCY DEPARTMENT AT [place] Provider Note   CSN: [id number] Arrival date & time: 09/26/22  1903     History  Chief Complaint  Patient presents with   Amy Walls    Amy Walls is a 42 y.o. female with medical history of ADHD, anxiety, arthritis, asthma, chronic bronchitis, chronic back pain, fibromyalgia, hepatitis C, heroin abuse, IV drug user.  Patient presents to ED for evaluation of left hip pain, fall.  Patient reports that this morning she tripped over her dog and landed on her left hip.  Patient states that since this time she has had issues with ambulation.  Patient also complaining of left knee pain.  Patient denies loss of consciousness, hitting her head.  Patient denies blood thinners.  Patient denies medications prior to arrival. Denies nausea or vomiting.    Fall       Home Medications Prior to Admission medications   Medication Sig Start Date End Date Taking? Authorizing Provider  buprenorphine-naloxone (SUBOXONE) 8-2 mg SUBL SL tablet Place 1 tablet under the tongue 2 (two) times daily. 09/30/21   [provider]  naloxone [facility]) nasal spray 4 mg/0.1 mL Place 1 spray into the nose once as needed (opioid overdose).    [provider]  ondansetron (ZOFRAN) 4 MG tablet Take 1 tablet (4 mg total) by mouth 2 (two) times daily as needed for nausea or vomiting. 02/18/22 02/18/23  Ezequiel Essex, MD  ondansetron (ZOFRAN-ODT) 4 MG disintegrating tablet Take 1 tablet (4 mg total) by mouth every 8 (eight) hours as needed for nausea or vomiting. 04/10/22   Fransico Meadow, PA-C  promethazine (PHENERGAN) 25 MG tablet Take 1 tablet (25 mg total) by mouth every 6 (six) hours as needed for nausea or vomiting. Patient not taking: Reported on 10/12/2021 03/24/21   Sherwood Gambler, MD      Allergies    Cinnamon, Levaquin [levofloxacin], Morphine and related, Penicillins, and Acetaminophen    Review of Systems   Review of Systems   Gastrointestinal:  Negative for nausea and vomiting.  Musculoskeletal:  Positive for arthralgias.  Neurological:  Negative for syncope.  All other systems reviewed and are negative.   Physical Exam Updated Vital Signs BP 105/66   Pulse 75   Temp 97.9 F (36.6 C) (Oral)   Resp 16   Ht 4\' 11"  (1.499 m)   Wt 49.9 kg   SpO2 98%   BMI 22.22 kg/m  Physical Exam Vitals and nursing note reviewed.  Constitutional:      General: She is not in acute distress.    Appearance: Normal appearance. She is not ill-appearing, toxic-appearing or diaphoretic.  HENT:     Head: Normocephalic and atraumatic.     Nose: Nose normal. No congestion.     Mouth/Throat:     Mouth: Mucous membranes are moist.     Pharynx: Oropharynx is clear.  Eyes:     Extraocular Movements: Extraocular movements intact.     Conjunctiva/sclera: Conjunctivae normal.     Pupils: Pupils are equal, round, and reactive to light.  Cardiovascular:     Rate and Rhythm: Normal rate and regular rhythm.  Pulmonary:     Effort: Pulmonary effort is normal.     Breath sounds: Normal breath sounds. No wheezing.  Abdominal:     General: Abdomen is flat. Bowel sounds are normal.     Palpations: Abdomen is soft.     Tenderness: There is no abdominal tenderness.  Musculoskeletal:  Cervical back: Normal range of motion and neck supple. No rigidity or tenderness.     Right hip: Normal.     Left hip: Tenderness present. No deformity or lacerations. Normal range of motion.     Right knee: Normal.     Left knee: No swelling, deformity, erythema or ecchymosis. Normal range of motion. Tenderness present.     Comments: Patient has full range of motion to the left hip with no overlying skin change, no shortening or rotation of left lower extremity, no ecchymosis or erythema.  Patient left knee with full range of motion, no overlying skin change, no deformity.  Skin:    General: Skin is warm and dry.     Capillary Refill: Capillary  refill takes less than 2 seconds.  Neurological:     General: No focal deficit present.     Mental Status: She is alert and oriented to person, place, and time.     GCS: GCS eye subscore is 4. GCS verbal subscore is 5. GCS motor subscore is 6.     Cranial Nerves: Cranial nerves 2-12 are intact. No cranial nerve deficit.     Sensory: Sensation is intact. No sensory deficit.     Motor: Motor function is intact. No weakness.     Coordination: Coordination is intact. Heel to [facility] Test normal.     ED Results / Procedures / Treatments   Labs (all labs ordered are listed, but only abnormal results are displayed) Labs Reviewed  PREGNANCY, URINE    EKG None  Radiology DG Knee Complete 4 Views Left  Result Date: 09/26/2022 CLINICAL DATA:  Pain after trip and fall injury today. EXAM: LEFT KNEE - COMPLETE 4+ VIEW COMPARISON:  None Available. FINDINGS: No evidence of fracture, dislocation, or joint effusion. No evidence of arthropathy or other focal bone abnormality. Soft tissues are unremarkable. IMPRESSION: Negative. Electronically Signed   By: Lucienne Capers M.D.   On: 09/26/2022 21:08   CT Hip Left Wo Contrast  Result Date: 09/26/2022 CLINICAL DATA:  Hip trauma with fracture suspected. EXAM: CT OF THE LEFT HIP WITHOUT CONTRAST TECHNIQUE: Multidetector CT imaging of the left hip was performed according to the standard protocol. Multiplanar CT image reconstructions were also generated. RADIATION DOSE REDUCTION: This exam was performed according to the departmental dose-optimization program which includes automated exposure control, adjustment of the mA and/or kV according to patient size and/or use of iterative reconstruction technique. COMPARISON:  None Available. FINDINGS: Bones/Joint/Cartilage No evidence of fracture or dislocation. Soft tissues Normal. IMPRESSION: No evidence of fracture or dislocation. Electronically Signed   By: Fidela Salisbury M.D.   On: 09/26/2022 21:04   DG HIP  UNILAT WITH PELVIS 2-3 VIEWS LEFT  Result Date: 09/26/2022 CLINICAL DATA:  Left hip pain and difficulty ambulating after a fall. EXAM: DG HIP (WITH OR WITHOUT PELVIS) 2-3V LEFT COMPARISON:  None Available. FINDINGS: There is no evidence of hip fracture or dislocation. There is no evidence of arthropathy or other focal bone abnormality. IMPRESSION: Negative. Electronically Signed   By: Lucienne Capers M.D.   On: 09/26/2022 20:00    Procedures Procedures   Medications Ordered in ED Medications  ibuprofen (ADVIL) tablet 800 mg (800 mg Oral Given 09/26/22 2042)    ED Course/ Medical Decision Making/ A&P  Medical Decision Making Amount and/or Complexity of Data Reviewed Labs: ordered. Radiology: ordered.  Risk Prescription drug management.   42 year old female presents to the ED for evaluation of fall.  Please see HPI for  further details.   On examination patient afebrile and nontachycardic.  Patient lung sounds are clear bilaterally, he is not hypoxic on room air.  Abdomen soft and compressible throughout.  Neurological examination shows no focal neurodeficits.  Patient pupils PERRL.  Patient left hip has no shortening or rotation.  There is no deformity.  There is nonfocal and generalized tenderness however the patient does have full range of motion to her left hip.  Patient left knee has no overlying skin change, deformity.  There is tenderness however nonfocal.  Patient has full range of motion of left knee.  Patient initial plain film images of left pelvis negative for fracture.  Patient states that she has significant pain there, has decreased ability to ambulate secondary to pain so we will proceed with CT imaging of left hip to rule out fracture.  Patient plain film imaging of left knee shows no effusions, deformities, acute pathology.  Patient CT scan of left hip shows no fracture.  At this time, patient will be discharged home and advised to utilize RICE protocol.  Patient will be  provided with crutches for ambulation.  Patient will be referred to sports medicine for further management.  Return precautions were provided, patient voiced understanding.  Patient had all her questions answered to her satisfaction.  Patient stable for discharge.  Final Clinical Impression(s) / ED Diagnoses Final diagnoses:  Fall, initial encounter  Pain of left hip  Acute pain of left knee    Rx / DC Orders ED Discharge Orders     None         Lawana Chambers 09/26/22 2129    Tretha Sciara, MD 09/29/22 1556

## 2022-09-26 NOTE — ED Triage Notes (Signed)
Pt c/o LT hip pain after falling over dog today

## 2022-09-28 ENCOUNTER — Ambulatory Visit: Payer: Commercial Managed Care - HMO | Admitting: Family Medicine

## 2022-09-29 ENCOUNTER — Ambulatory Visit: Payer: Commercial Managed Care - HMO | Admitting: Family Medicine

## 2022-09-30 ENCOUNTER — Ambulatory Visit: Payer: Commercial Managed Care - HMO | Admitting: Family Medicine

## 2022-11-14 ENCOUNTER — Emergency Department (HOSPITAL_BASED_OUTPATIENT_CLINIC_OR_DEPARTMENT_OTHER)
Admission: EM | Admit: 2022-11-14 | Discharge: 2022-11-14 | Disposition: A | Discharge status: Home / Self Care | Payer: Commercial Managed Care - HMO | Attending: Emergency Medicine | Admitting: Emergency Medicine

## 2022-11-14 ENCOUNTER — Encounter (HOSPITAL_BASED_OUTPATIENT_CLINIC_OR_DEPARTMENT_OTHER): Payer: Self-pay

## 2022-11-14 ENCOUNTER — Emergency Department (HOSPITAL_BASED_OUTPATIENT_CLINIC_OR_DEPARTMENT_OTHER): Payer: Commercial Managed Care - HMO

## 2022-11-14 DIAGNOSIS — R55 Syncope and collapse: Secondary | ICD-10-CM | POA: Diagnosis not present

## 2022-11-14 DIAGNOSIS — U071 COVID-19: Secondary | ICD-10-CM

## 2022-11-14 DIAGNOSIS — J0100 Acute maxillary sinusitis, unspecified: Secondary | ICD-10-CM

## 2022-11-14 DIAGNOSIS — R0981 Nasal congestion: Secondary | ICD-10-CM | POA: Diagnosis present

## 2022-11-14 LAB — RESP PANEL BY RT-PCR (RSV, FLU A&B, COVID)  RVPGX2
Influenza A by PCR: NEGATIVE
Influenza B by PCR: NEGATIVE
Resp Syncytial Virus by PCR: NEGATIVE
SARS Coronavirus 2 by RT PCR: POSITIVE — AB

## 2022-11-14 LAB — COMPREHENSIVE METABOLIC PANEL
ALT: 119 U/L — ABNORMAL HIGH (ref 0–44)
AST: 187 U/L — ABNORMAL HIGH (ref 15–41)
Albumin: 4 g/dL (ref 3.5–5.0)
Alkaline Phosphatase: 72 U/L (ref 38–126)
Anion gap: 9 (ref 5–15)
BUN: 18 mg/dL (ref 6–20)
CO2: 25 mmol/L (ref 22–32)
Calcium: 9.2 mg/dL (ref 8.9–10.3)
Chloride: 101 mmol/L (ref 98–111)
Creatinine, Ser: 0.88 mg/dL (ref 0.44–1.00)
GFR, Estimated: >60 mL/min (ref >60)
Glucose, Bld: 145 mg/dL — ABNORMAL HIGH (ref 70–99)
Potassium: 3.7 mmol/L (ref 3.5–5.1)
Sodium: 135 mmol/L (ref 135–145)
Total Bilirubin: 0.9 mg/dL (ref 0.3–1.2)
Total Protein: 7.4 g/dL (ref 6.5–8.1)

## 2022-11-14 LAB — CBC WITH DIFFERENTIAL/PLATELET
Abs Immature Granulocytes: 0.02 10*3/uL (ref 0.00–0.07)
Basophils Absolute: 0 10*3/uL (ref 0.0–0.1)
Basophils Relative: 0 %
Eosinophils Absolute: 0.1 10*3/uL (ref 0.0–0.5)
Eosinophils Relative: 1 %
HCT: 44.9 % (ref 36.0–46.0)
Hemoglobin: 15.9 g/dL — ABNORMAL HIGH (ref 12.0–15.0)
Immature Granulocytes: 0 %
Lymphocytes Relative: 36 %
Lymphs Abs: 2.8 10*3/uL (ref 0.7–4.0)
MCH: 31.2 pg (ref 26.0–34.0)
MCHC: 35.4 g/dL (ref 30.0–36.0)
MCV: 88.2 fL (ref 80.0–100.0)
Monocytes Absolute: 0.4 10*3/uL (ref 0.1–1.0)
Monocytes Relative: 5 %
Neutro Abs: 4.4 10*3/uL (ref 1.7–7.7)
Neutrophils Relative %: 58 %
Platelets: 273 10*3/uL (ref 150–400)
RBC: 5.09 MIL/uL (ref 3.87–5.11)
RDW: 11.2 % — ABNORMAL LOW (ref 11.5–15.5)
WBC: 7.6 10*3/uL (ref 4.0–10.5)
nRBC: 0 % (ref 0.0–0.2)

## 2022-11-14 LAB — LIPASE, BLOOD: Lipase: 30 U/L (ref 11–51)

## 2022-11-14 MED ORDER — IBUPROFEN 400 MG PO TABS
600.0000 mg | ORAL_TABLET | Freq: Once | ORAL | Status: AC
  Administered 2022-11-14: 600 mg via ORAL
  Filled 2022-11-14: qty 1

## 2022-11-14 MED ORDER — SODIUM CHLORIDE 0.9 % IV BOLUS
1000.0000 mL | Freq: Once | INTRAVENOUS | Status: AC
  Administered 2022-11-14: 1000 mL via INTRAVENOUS

## 2022-11-14 MED ORDER — DOXYCYCLINE HYCLATE 100 MG PO CAPS: 100.0000 mg | ORAL_CAPSULE | Freq: Two times a day (BID) | ORAL | 0 refills | Status: DC

## 2022-11-14 NOTE — ED Triage Notes (Signed)
Pt states she has bilateral ear pain and sinus congestion Had episode this am of N/V

## 2022-11-14 NOTE — ED Provider Notes (Cosign Needed Addendum)
[place] EMERGENCY DEPARTMENT AT [place] Provider Note   CSN: [id number] Arrival date & time: 11/14/22  2039     History  Chief Complaint  Patient presents with   Otalgia    Amy Walls is a 42 y.o. female presented with 1 and half weeks of bilateral ear pain and sinus congestion.  Patient states she has bilateral ear pain but denied any changes in hearing or tinnitus.  Patient states she has been taking TheraFlu and ibuprofen which has been helping with symptoms however symptoms have persisted.  Patient states she had a fever at the very beginning of 63 F however that is since resolved.  Patient endorsed chills.  Patient stated this morning she woke up and had 4 episodes of emesis, stated the room began to spin, and that she lost consciousness and does not know if she hit her head when she fell.  Patient denied any weakness or changes in sensation after her fall.  Patient stated the dizziness and nausea have since resolved.  Patient had chest pain, shortness of breath, hematemesis, blood thinners, neck stiffness, altered state, dysuria,  Home Medications Prior to Admission medications   Medication Sig Start Date End Date Taking? Authorizing Provider  doxycycline (VIBRAMYCIN) 100 MG capsule Take 1 capsule (100 mg total) by mouth 2 (two) times daily. 11/14/22  Yes Everlene Cunning, Florene Route, PA-C  buprenorphine-naloxone (SUBOXONE) 8-2 mg SUBL SL tablet Place 1 tablet under the tongue 2 (two) times daily. 09/30/21   [provider]  naloxone [facility]) nasal spray 4 mg/0.1 mL Place 1 spray into the nose once as needed (opioid overdose).    [provider]  ondansetron (ZOFRAN) 4 MG tablet Take 1 tablet (4 mg total) by mouth 2 (two) times daily as needed for nausea or vomiting. 02/18/22 02/18/23  Ezequiel Essex, MD  ondansetron (ZOFRAN-ODT) 4 MG disintegrating tablet Take 1 tablet (4 mg total) by mouth every 8 (eight) hours as needed for nausea or vomiting. 04/10/22   Fransico Meadow, PA-C  promethazine (PHENERGAN) 25 MG tablet Take 1 tablet (25 mg total) by mouth every 6 (six) hours as needed for nausea or vomiting. Patient not taking: Reported on 10/12/2021 03/24/21   Sherwood Gambler, MD      Allergies    Cinnamon, Levaquin [levofloxacin], Morphine and related, Penicillins, and Acetaminophen    Review of Systems   Review of Systems  HENT:  Positive for ear pain.   See HPI  Physical Exam Updated Vital Signs BP 108/74   Pulse 61   Temp 97.8 F (36.6 C) (Oral)   Resp 18   Ht 5' (1.524 m)   Wt 56.7 kg   SpO2 100%   BMI 24.41 kg/m  Physical Exam HENT:     Head: Normocephalic and atraumatic.     Comments: No step-off/crepitus/abnormalities palpated on head or neck Tender to palpation over maxillary sinuses    Right Ear: Tympanic membrane, ear canal and external ear normal.     Left Ear: Tympanic membrane, ear canal and external ear normal.     Nose: Congestion and rhinorrhea present.     Mouth/Throat:     Mouth: Mucous membranes are moist.     Pharynx: Posterior oropharyngeal erythema present.  Eyes:     Extraocular Movements: Extraocular movements intact.     Conjunctiva/sclera: Conjunctivae normal.     Pupils: Pupils are equal, round, and reactive to light.  Cardiovascular:     Rate and Rhythm: Normal rate and  regular rhythm.     Pulses: Normal pulses.     Heart sounds: Normal heart sounds.  Pulmonary:     Effort: Pulmonary effort is normal. No respiratory distress.     Breath sounds: Normal breath sounds.  Abdominal:     General: Abdomen is flat.     Palpations: Abdomen is soft.     Tenderness: There is no abdominal tenderness. There is no guarding.  Musculoskeletal:        General: Normal range of motion.     Cervical back: Normal range of motion.  Skin:    General: Skin is warm.     Capillary Refill: Capillary refill takes less than 2 seconds.  Neurological:     General: No focal deficit present.     Mental Status: She is alert  and oriented to person, place, and time.     Sensory: Sensation is intact.     Motor: Motor function is intact.     Coordination: Coordination is intact.     Gait: Gait is intact.     Comments: No nystagmus noted Vision grossly intact Nerves III through XII intact  Psychiatric:        Mood and Affect: Mood normal.     ED Results / Procedures / Treatments   Labs (all labs ordered are listed, but only abnormal results are displayed) Labs Reviewed  RESP PANEL BY RT-PCR (RSV, FLU A&B, COVID)  RVPGX2 - Abnormal; Notable for the following components:      Result Value   SARS Coronavirus 2 by RT PCR POSITIVE (*)    All other components within normal limits  CBC WITH DIFFERENTIAL/PLATELET - Abnormal; Notable for the following components:   Hemoglobin 15.9 (*)    RDW 11.2 (*)    All other components within normal limits  COMPREHENSIVE METABOLIC PANEL - Abnormal; Notable for the following components:   Glucose, Bld 145 (*)    AST 187 (*)    ALT 119 (*)    All other components within normal limits  LIPASE, BLOOD    EKG None  Radiology CT Head Wo Contrast  Result Date: 11/14/2022 CLINICAL DATA:  Loss of consciousness. EXAM: CT HEAD WITHOUT CONTRAST TECHNIQUE: Contiguous axial images were obtained from the base of the skull through the vertex without intravenous contrast. RADIATION DOSE REDUCTION: This exam was performed according to the departmental dose-optimization program which includes automated exposure control, adjustment of the mA and/or kV according to patient size and/or use of iterative reconstruction technique. COMPARISON:  Head CT dated 09/10/2016. FINDINGS: Brain: The ventricles and sulci are appropriate size for the patient's age. The gray-white matter discrimination is preserved. There is no acute intracranial hemorrhage. No mass effect or midline shift. No extra-axial fluid collection. Vascular: No hyperdense vessel or unexpected calcification. Skull: Normal. Negative for  fracture or focal lesion. Sinuses/Orbits: No acute finding. Other: None IMPRESSION: No acute intracranial pathology. Electronically Signed   By: Anner Crete M.D.   On: 11/14/2022 22:21    Procedures Procedures    Medications Ordered in ED Medications  ibuprofen (ADVIL) tablet 600 mg (600 mg Oral Given 11/14/22 2127)  sodium chloride 0.9 % bolus 1,000 mL (1,000 mLs Intravenous New Bag/Given 11/14/22 2142)    ED Course/ Medical Decision Making/ A&P                             Medical Decision Making Amount and/or Complexity of Data Reviewed Labs: ordered.  Radiology: ordered.  Risk Prescription drug management.   Amy Walls 42 y.o. presented today for sinus pressure and ear pain. Working DDx that I considered at this time includes, but not limited to, sinusitis, AOM, skull fracture, epidural/subdural hematoma, vasovagal, dehydration, electrolyte abnormalities, URI.  R/o DDx: AOM, skull fracture, epidural/subdural hematoma, dehydration, electrolyte abnormalities: These are considered less likely due to history of present illness and physical exam findings  Review of prior external notes: 09/26/2022 ED  Unique Tests and My Interpretation:  CMP: Transaminitis Lipase: Unremarkable Respiratory panel: COVID-positive CBC differential: Unremarkable CT head without contrast: No acute changes  Discussion with Independent Historian: None  Discussion of Management of Tests: None  Risk:    Medium:  - prescription drug management  Risk Stratification Score: None  Plan: Patient presented for sinus pressure with bilateral ear pain along with a fall. On exam patient was in no acute distress and stable vitals.  Patient was tender to maxillary sinuses on exam however did not have any signs of trauma around her head but given that patient lost consciousness a CT head was ordered to rule out any life-threatening pathology.  Labs ordered as patient had episodes of emesis and reported  fevers.  Patient be given fluids and ibuprofen present management.  Patient stable at this time.  Patient most likely has bacterial sinusitis given timeframe of symptoms and will be given doxycycline as she is allergic to penicillin.  I believe patient had a vagal episode due to the emesis which caused her to lose consciousness as opposed to a life-threatening pathology.  Patient test positive for COVID however symptoms been going on for a week and a half and so patient is outside the Paxlovid window.  Patient stable at this time.  Patient was given return precautions. Patient stable for discharge at this time.  Patient verbalized understanding of plan.  At discharge patient stated that she has not documented allergy to doxycycline as a gives her psychosis.  Due to the patient having allergies to penicillins, doxycycline, Levaquin there is nothing to be no other antibiotic to treat for sinusitis.  Upon review of CT scan radiologist did not note any signs of sinusitis and so at this time I highly suspect her sinus tenderness is most likely due to her COVID and patient will be discharged without antibiotics.      Final Clinical Impression(s) / ED Diagnoses Final diagnoses:  COVID  Acute non-recurrent maxillary sinusitis    Rx / DC Orders ED Discharge Orders          Ordered    doxycycline (VIBRAMYCIN) 100 MG capsule  2 times daily        11/14/22 2236              Elvina Sidle 11/14/22 2238    Wyvonnia Dusky, MD 11/14/22 2241    Chuck Hint, PA-C 11/14/22 2247    Wyvonnia Dusky, MD 11/15/22 1450

## 2022-11-14 NOTE — Discharge Instructions (Addendum)
Please pick up the antibiotics I have prescribed for you.  Today tested positive for COVID however you also may have a sinus infection which is what antibiotics are for.  Please continue taking plenty of fluids along with food as tolerated.  You may take ibuprofen every 6 hours as needed for pain.  Please follow-up with your primary care provider in the next week to be reevaluated as things may change.  If symptoms worsen please return to the ER.

## 2022-12-08 ENCOUNTER — Encounter: Payer: Self-pay | Admitting: *Deleted

## 2023-01-20 ENCOUNTER — Emergency Department (HOSPITAL_BASED_OUTPATIENT_CLINIC_OR_DEPARTMENT_OTHER): Payer: Commercial Managed Care - HMO

## 2023-01-20 ENCOUNTER — Other Ambulatory Visit (HOSPITAL_BASED_OUTPATIENT_CLINIC_OR_DEPARTMENT_OTHER): Payer: Self-pay

## 2023-01-20 ENCOUNTER — Encounter (HOSPITAL_BASED_OUTPATIENT_CLINIC_OR_DEPARTMENT_OTHER): Payer: Self-pay | Admitting: Emergency Medicine

## 2023-01-20 ENCOUNTER — Other Ambulatory Visit: Payer: Self-pay

## 2023-01-20 ENCOUNTER — Emergency Department (HOSPITAL_BASED_OUTPATIENT_CLINIC_OR_DEPARTMENT_OTHER)
Admission: EM | Admit: 2023-01-20 | Discharge: 2023-01-20 | Disposition: A | Discharge status: Home / Self Care | Payer: Commercial Managed Care - HMO | Attending: Emergency Medicine | Admitting: Emergency Medicine

## 2023-01-20 DIAGNOSIS — J45909 Unspecified asthma, uncomplicated: Secondary | ICD-10-CM | POA: Insufficient documentation

## 2023-01-20 DIAGNOSIS — J029 Acute pharyngitis, unspecified: Secondary | ICD-10-CM | POA: Diagnosis present

## 2023-01-20 DIAGNOSIS — Z1152 Encounter for screening for COVID-19: Secondary | ICD-10-CM | POA: Insufficient documentation

## 2023-01-20 DIAGNOSIS — J020 Streptococcal pharyngitis: Secondary | ICD-10-CM | POA: Diagnosis not present

## 2023-01-20 LAB — RESP PANEL BY RT-PCR (RSV, FLU A&B, COVID)  RVPGX2
Influenza A by PCR: NEGATIVE
Influenza B by PCR: NEGATIVE
Resp Syncytial Virus by PCR: NEGATIVE
SARS Coronavirus 2 by RT PCR: NEGATIVE

## 2023-01-20 LAB — BASIC METABOLIC PANEL
Anion gap: 7 (ref 5–15)
BUN: 17 mg/dL (ref 6–20)
CO2: 24 mmol/L (ref 22–32)
Calcium: 8.9 mg/dL (ref 8.9–10.3)
Chloride: 97 mmol/L — ABNORMAL LOW (ref 98–111)
Creatinine, Ser: 0.81 mg/dL (ref 0.44–1.00)
GFR, Estimated: >60 mL/min (ref >60)
Glucose, Bld: 96 mg/dL (ref 70–99)
Potassium: 4.1 mmol/L (ref 3.5–5.1)
Sodium: 128 mmol/L — ABNORMAL LOW (ref 135–145)

## 2023-01-20 LAB — CBC WITH DIFFERENTIAL/PLATELET
Abs Immature Granulocytes: 0.06 10*3/uL (ref 0.00–0.07)
Basophils Absolute: 0 10*3/uL (ref 0.0–0.1)
Basophils Relative: 0 %
Eosinophils Absolute: 0 10*3/uL (ref 0.0–0.5)
Eosinophils Relative: 0 %
HCT: 38.6 % (ref 36.0–46.0)
Hemoglobin: 13.5 g/dL (ref 12.0–15.0)
Immature Granulocytes: 0 %
Lymphocytes Relative: 11 %
Lymphs Abs: 1.5 10*3/uL (ref 0.7–4.0)
MCH: 30.5 pg (ref 26.0–34.0)
MCHC: 35 g/dL (ref 30.0–36.0)
MCV: 87.3 fL (ref 80.0–100.0)
Monocytes Absolute: 0.7 10*3/uL (ref 0.1–1.0)
Monocytes Relative: 5 %
Neutro Abs: 11.7 10*3/uL — ABNORMAL HIGH (ref 1.7–7.7)
Neutrophils Relative %: 84 %
Platelets: 204 10*3/uL (ref 150–400)
RBC: 4.42 MIL/uL (ref 3.87–5.11)
RDW: 11.8 % (ref 11.5–15.5)
WBC: 14 10*3/uL — ABNORMAL HIGH (ref 4.0–10.5)
nRBC: 0 % (ref 0.0–0.2)

## 2023-01-20 LAB — GROUP A STREP BY PCR: Group A Strep by PCR: DETECTED — AB

## 2023-01-20 MED ORDER — DEXAMETHASONE SODIUM PHOSPHATE 10 MG/ML IJ SOLN
10.0000 mg | Freq: Once | INTRAMUSCULAR | Status: AC
  Administered 2023-01-20: 10 mg via INTRAVENOUS

## 2023-01-20 MED ORDER — DEXAMETHASONE SODIUM PHOSPHATE 10 MG/ML IJ SOLN
10.0000 mg | Freq: Once | INTRAMUSCULAR | Status: DC
  Filled 2023-01-20: qty 1

## 2023-01-20 MED ORDER — IBUPROFEN 800 MG PO TABS
800.0000 mg | ORAL_TABLET | Freq: Once | ORAL | Status: AC
  Administered 2023-01-20: 800 mg via ORAL
  Filled 2023-01-20: qty 1

## 2023-01-20 MED ORDER — IOHEXOL 300 MG/ML  SOLN
75.0000 mL | Freq: Once | INTRAMUSCULAR | Status: AC | PRN
  Administered 2023-01-20: 75 mL via INTRAVENOUS

## 2023-01-20 MED ORDER — CLINDAMYCIN HCL 150 MG PO CAPS
450.0000 mg | ORAL_CAPSULE | Freq: Three times a day (TID) | ORAL | 0 refills | Status: DC
  Filled 2023-01-20: qty 90, 10d supply, fill #0

## 2023-01-20 MED ORDER — AZITHROMYCIN 250 MG PO TABS
ORAL_TABLET | ORAL | 0 refills | Status: AC
  Filled 2023-01-20: qty 6, 5d supply, fill #0

## 2023-01-20 MED ORDER — PREDNISONE 10 MG PO TABS
ORAL_TABLET | Freq: Every day | ORAL | 0 refills | Status: DC
  Filled 2023-01-20: qty 42, 12d supply, fill #0

## 2023-01-20 NOTE — ED Provider Notes (Signed)
[place] [facility] Provider Note   CSN: [id number] Arrival date & time: 01/20/23  1439     History  Chief Complaint  Patient presents with   Sore Throat   Facial Pain    Amy Walls is a 42 y.o. female with a past medical history of ADHD, asthma, fibromyalgia, IV drug user presents today for evaluation of sore throat and neck pain.  Patient states she woke up this morning with sore throat and significant neck pain and swelling.  She reports pain with swallowing and speaking.  She states she could hardly swallow food or drink.  She reports subjective cold/chills since yesterday.  She reports runny nose and nasal congestion this morning.  Denies fever, nausea, vomiting, chest pain or shortness of breath.  No known sick contacts.   Sore Throat      Past Medical History:  Diagnosis Date   ADHD (attention deficit hyperactivity disorder)    Anxiety    Arthritis    "inflammation of all my joints"   Asthma    Bowel obstruction (HCC) 2007   "upper bowel"   Chronic bronchitis    "yearly"   Chronic mid back pain    Complication of anesthesia 2007   "lost me on the table when I had upper bowel obstruction"   Constipation, chronic    Depression    Fibromyalgia    Hepatitis C    Heroin abuse (HCC)    Hypoglycemia    "my sugar runs low alot"   IV drug user    Kidney infection    "often"   Migraines 12/28/11   "@ least once/wk"   PONV (postoperative nausea and vomiting)    Recurrent UTI    Past Surgical History:  Procedure Laterality Date   BOWEL RESECTION  04/2006   "upper bowel"   OVARY SURGERY  ~ 2002   "had 10 inches blood built up; they opened me up and got the blood out"     Home Medications Prior to Admission medications   Medication Sig Start Date End Date Taking? Authorizing Provider  buprenorphine-naloxone (SUBOXONE) 8-2 mg SUBL SL tablet Place 1 tablet under the tongue 2 (two) times daily. 09/30/21   [provider]   naloxone [facility]) nasal spray 4 mg/0.1 mL Place 1 spray into the nose once as needed (opioid overdose).    [provider]  ondansetron (ZOFRAN) 4 MG tablet Take 1 tablet (4 mg total) by mouth 2 (two) times daily as needed for nausea or vomiting. 02/18/22 02/18/23  Fayette Pho, MD  ondansetron (ZOFRAN-ODT) 4 MG disintegrating tablet Take 1 tablet (4 mg total) by mouth every 8 (eight) hours as needed for nausea or vomiting. 04/10/22   Elson Areas, PA-C  promethazine (PHENERGAN) 25 MG tablet Take 1 tablet (25 mg total) by mouth every 6 (six) hours as needed for nausea or vomiting. Patient not taking: Reported on 10/12/2021 03/24/21   Pricilla Loveless, MD      Allergies    Cinnamon, Levaquin [levofloxacin], Morphine and codeine, Penicillins, and Acetaminophen    Review of Systems   Review of Systems Negative except as per HPI.  Physical Exam Updated Vital Signs BP 97/72 (BP Location: Left Arm)   Pulse 80   Temp 98.7 F (37.1 C) (Oral)   Resp 16   Ht 5' (1.524 m)   Wt 56.7 kg   SpO2 99%   BMI 24.41 kg/m  Physical Exam Vitals and nursing note reviewed.  Constitutional:      Appearance: Normal appearance.     Comments: Appears uncomfortable.  HENT:     Head: Normocephalic and atraumatic.     Mouth/Throat:     Mouth: Mucous membranes are moist.     Pharynx: Uvula midline.     Tonsils: No tonsillar exudate or tonsillar abscesses.     Comments: Posterior oropharyngeal erythema noted.  Uvula midline.  No tonsillar exudates.  Patient does have hot potato voice. Eyes:     General: No scleral icterus. Cardiovascular:     Rate and Rhythm: Normal rate and regular rhythm.     Pulses: Normal pulses.     Heart sounds: Normal heart sounds.  Pulmonary:     Effort: Pulmonary effort is normal.     Breath sounds: Normal breath sounds.  Abdominal:     General: Abdomen is flat.     Palpations: Abdomen is soft.     Tenderness: There is no abdominal tenderness.  Musculoskeletal:         General: No deformity.  Skin:    General: Skin is warm.     Findings: No rash.  Neurological:     General: No focal deficit present.     Mental Status: She is alert.  Psychiatric:        Mood and Affect: Mood normal.    ED Results / Procedures / Treatments   Labs (all labs ordered are listed, but only abnormal results are displayed) Labs Reviewed  GROUP A STREP BY PCR  RESP PANEL BY RT-PCR (RSV, FLU A&B, COVID)  RVPGX2  BASIC METABOLIC PANEL  CBC WITH DIFFERENTIAL/PLATELET    EKG None  Radiology No results found.  Procedures Procedures    Medications Ordered in ED Medications - No data to display  ED Course/ Medical Decision Making/ A&P                             Medical Decision Making Amount and/or Complexity of Data Reviewed Labs: ordered. Radiology: ordered.  Risk Prescription drug management.  This patient presents to the ED for sore throat, body aches, this involves an extensive number of treatment options, and is a complaint that carries with a high risk of complications and morbidity.  The differential diagnosis includes flu, COVID, RSV, strep, pharyngitis, bronchitis, pneumonia, epiglottitis, foreign body, GC/chlamydia, Ludwick's mono, peritonsillar abscess, retropharyngeal abscess, strep, viral. This is not an exhaustive list.    Lab tests: I ordered and personally interpreted labs.  The pertinent results include: Viral panel negative.  Strep positive.  Imaging studies: CT soft tissue of the neck showed pharyngitis/tonsillitis.  Problem list/ ED course/ Critical interventions/ Medical management: HPI: See above Vital signs within normal range and stable throughout visit. Laboratory/imaging studies significant for: See above. On physical examination, patient is afebrile and appears in no acute distress. This patient presents with symptoms suspicious for strep pharyngitis. Based on history and physical doubt sinusitis. COVID test was  negative. Do not suspect underlying cardiopulmonary process. I considered, but think unlikely, pneumonia.  Based on CT scan results, I have low suspicion for epiglottitis, Ludwig angina, peritonsillar abscess, retropharyngeal abscess.  Patient is nontoxic appearing and not in need of emergent medical intervention.  Given Tylenol and Decadron IV.  I will send an Rx of azithromycin.  Patient reports she is allergic to penicillin, clindamycin. Also recommended patient to take TheraFlu or Mucinex for symptom relief.  Follow-up with primary care physician for  further evaluation and management.  Return to the ER if new or worsening symptoms. I have reviewed the patient home medicines and have made adjustments as needed.  Cardiac monitoring/EKG: The patient was maintained on a cardiac monitor.  I personally reviewed and interpreted the cardiac monitor which showed an underlying rhythm of: sinus rhythm.  Additional history obtained: External records from outside source obtained and reviewed including: Chart review including previous notes, labs, imaging.  Consultations obtained:  Disposition Continued outpatient therapy. Follow-up with PCP recommended for reevaluation of symptoms. Treatment plan discussed with patient.  Pt acknowledged understanding was agreeable to the plan. Worrisome signs and symptoms were discussed with patient, and patient acknowledged understanding to return to the ED if they noticed these signs and symptoms. Patient was stable upon discharge.   This chart was dictated using voice recognition software.  Despite best efforts to proofread,  errors can occur which can change the documentation meaning.           Final Clinical Impression(s) / ED Diagnoses Final diagnoses:  Strep pharyngitis    Rx / DC Orders ED Discharge Orders          Ordered    clindamycin (CLEOCIN) 150 MG capsule  3 times daily,   Status:  Discontinued        01/20/23 1642    predniSONE (DELTASONE) 10  MG tablet  Daily        01/20/23 1642    azithromycin (ZITHROMAX) 250 MG tablet  Daily        01/20/23 1649              Jeanelle Malling, PA 01/20/23 1709    Rondel Baton, MD 01/24/23 [phone]

## 2023-01-20 NOTE — ED Notes (Signed)
Patient transported to CT 

## 2023-01-20 NOTE — ED Notes (Signed)
Discharge instructions reviewed with patient. Patient verbalizes understanding, no further questions at this time. Medications/prescriptions and follow up information provided. No acute distress noted at time of departure.  

## 2023-01-20 NOTE — ED Triage Notes (Signed)
Patient arrives ambulatory by POV c/o right sided facial pain, dental pain, difficulty talking and sore throat when waking this morning.

## 2023-01-20 NOTE — Discharge Instructions (Addendum)
Please take your antibiotics and steroids as prescribed. Take tylenol/ibuprofen for pain. You can also Theraflu and Mucinex for symptom relief. I recommend close follow-up with PCP for reevaluation.  Please do not hesitate to return to emergency department if worrisome signs symptoms we discussed become apparent.

## 2023-10-17 ENCOUNTER — Other Ambulatory Visit: Payer: Self-pay

## 2023-10-17 ENCOUNTER — Encounter (HOSPITAL_BASED_OUTPATIENT_CLINIC_OR_DEPARTMENT_OTHER): Payer: Self-pay | Admitting: Emergency Medicine

## 2023-10-17 DIAGNOSIS — J40 Bronchitis, not specified as acute or chronic: Secondary | ICD-10-CM | POA: Diagnosis not present

## 2023-10-17 DIAGNOSIS — R059 Cough, unspecified: Secondary | ICD-10-CM | POA: Diagnosis present

## 2023-10-17 LAB — RESP PANEL BY RT-PCR (RSV, FLU A&B, COVID)  RVPGX2
Influenza A by PCR: NEGATIVE
Influenza B by PCR: NEGATIVE
Resp Syncytial Virus by PCR: NEGATIVE
SARS Coronavirus 2 by RT PCR: NEGATIVE

## 2023-10-17 NOTE — ED Triage Notes (Signed)
 Pt c/o flu symptoms since last Wednesday. Symptoms include SOB, cough, chills, hoarseness, headache, body aches. Admits exposure to recent sick contacts.

## 2023-10-18 ENCOUNTER — Emergency Department (HOSPITAL_BASED_OUTPATIENT_CLINIC_OR_DEPARTMENT_OTHER)
Admission: EM | Admit: 2023-10-18 | Discharge: 2023-10-18 | Disposition: A | Discharge status: Home / Self Care | Payer: MEDICAID | Attending: Emergency Medicine | Admitting: Emergency Medicine

## 2023-10-18 DIAGNOSIS — R051 Acute cough: Secondary | ICD-10-CM

## 2023-10-18 DIAGNOSIS — J40 Bronchitis, not specified as acute or chronic: Secondary | ICD-10-CM

## 2023-10-18 MED ORDER — ALBUTEROL SULFATE HFA 108 (90 BASE) MCG/ACT IN AERS
2.0000 | INHALATION_SPRAY | Freq: Once | RESPIRATORY_TRACT | Status: AC
  Administered 2023-10-18: 2 via RESPIRATORY_TRACT
  Filled 2023-10-18: qty 6.7

## 2023-10-18 MED ORDER — DOXYCYCLINE HYCLATE 100 MG PO CAPS: 100.0000 mg | ORAL_CAPSULE | Freq: Two times a day (BID) | ORAL | 0 refills | Status: DC

## 2023-10-18 MED ORDER — DEXAMETHASONE 4 MG PO TABS
4.0000 mg | ORAL_TABLET | Freq: Once | ORAL | Status: AC
  Administered 2023-10-18: 4 mg via ORAL
  Filled 2023-10-18: qty 1

## 2023-10-18 NOTE — ED Provider Notes (Signed)
 [place] [facility] Provider Note   CSN: [id number] Arrival date & time: 10/17/23  2119     History  Chief Complaint  Patient presents with   Influenza    Amy Walls is a 43 y.o. female.  The history is provided by the patient.  Patient w/ history of opioid use disorder presents with flulike illness.  Around 5 days ago she began having cough, chills sore throat headache and bodyaches.  She also reports increasing shortness of breath.  No active chest or abdominal pain.  No vomiting or diarrhea. No hemoptysis.  Patient currently vapes.  She works at [employer] and has exposure to [employer]. Patient also reports previous history of asthma but is not on a daily medicines    Home Medications Prior to Admission medications   Medication Sig Start Date End Date Taking? Authorizing Provider  doxycycline (VIBRAMYCIN) 100 MG capsule Take 1 capsule (100 mg total) by mouth 2 (two) times daily. One po bid x 7 days 10/18/23  Yes Zadie Rhine, MD  buprenorphine-naloxone (SUBOXONE) 8-2 mg SUBL SL tablet Place 1 tablet under the tongue 2 (two) times daily. 09/30/21   [provider]  naloxone [facility]) nasal spray 4 mg/0.1 mL Place 1 spray into the nose once as needed (opioid overdose).    [provider]      Allergies    Cinnamon, Levaquin [levofloxacin], Morphine and codeine, Penicillins, and Acetaminophen    Review of Systems   Review of Systems  Constitutional:  Positive for chills and fever.  Respiratory:  Positive for cough and shortness of breath.   Cardiovascular:  Negative for chest pain.  Gastrointestinal:  Negative for vomiting.    Physical Exam Updated Vital Signs BP 105/63 (BP Location: Left Arm)   Pulse 72   Temp 98.7 F (37.1 C)   Resp 18   Ht 1.499 m (4\' 11" )   Wt 51.1 kg   SpO2 100%   BMI 22.76 kg/m  Physical Exam CONSTITUTIONAL: Well developed/well nourished HEAD: Normocephalic/atraumatic EYES:  EOMI/PERRL ENMT: Mucous membranes moist, uvula midline, no stridor, no drooling, minimal erythema, no exudate NECK: supple no meningeal signs CV: S1/S2 noted, no murmurs/rubs/gallops noted LUNGS: Lungs are clear to auscultation bilaterally, no apparent distress NEURO: Pt is awake/alert/appropriate, moves all extremitiesx4.  No facial droop.   SKIN: warm, color normal PSYCH: no abnormalities of mood noted, alert and oriented to situation  ED Results / Procedures / Treatments   Labs (all labs ordered are listed, but only abnormal results are displayed) Labs Reviewed  RESP PANEL BY RT-PCR (RSV, FLU A&B, COVID)  RVPGX2    EKG None  Radiology No results found.  Procedures Procedures    Medications Ordered in ED Medications  albuterol (VENTOLIN HFA) 108 (90 Base) MCG/ACT inhaler 2 puff (has no administration in time range)  dexamethasone (DECADRON) tablet 4 mg (has no administration in time range)    ED Course/ Medical Decision Making/ A&P                                 Medical Decision Making Risk Prescription drug management.   Patient presents with cough and upper respiratory infection symptoms for the past 5 days.  Viral panel reviewed and negative. Patient currently vapes and she was encouraged to stop.  Lungs are overall clear and she is not hypoxic, but given her history, will add on albuterol and  also antibiotic due to persistent cough Will defer imaging for now  Patient safe for discharge        Final Clinical Impression(s) / ED Diagnoses Final diagnoses:  Acute cough  Bronchitis    Rx / DC Orders ED Discharge Orders          Ordered    doxycycline (VIBRAMYCIN) 100 MG capsule  2 times daily        10/18/23 0300              Zadie Rhine, MD 10/18/23 [phone]

## 2023-10-26 ENCOUNTER — Emergency Department (HOSPITAL_BASED_OUTPATIENT_CLINIC_OR_DEPARTMENT_OTHER)
Admission: EM | Admit: 2023-10-26 | Discharge: 2023-10-26 | Disposition: A | Discharge status: Home / Self Care | Payer: MEDICAID | Attending: Emergency Medicine | Admitting: Emergency Medicine

## 2023-10-26 ENCOUNTER — Other Ambulatory Visit: Payer: Self-pay

## 2023-10-26 ENCOUNTER — Encounter (HOSPITAL_BASED_OUTPATIENT_CLINIC_OR_DEPARTMENT_OTHER): Payer: Self-pay | Admitting: Emergency Medicine

## 2023-10-26 DIAGNOSIS — R519 Headache, unspecified: Secondary | ICD-10-CM | POA: Diagnosis not present

## 2023-10-26 DIAGNOSIS — M791 Myalgia, unspecified site: Secondary | ICD-10-CM | POA: Diagnosis not present

## 2023-10-26 DIAGNOSIS — R197 Diarrhea, unspecified: Secondary | ICD-10-CM | POA: Diagnosis not present

## 2023-10-26 DIAGNOSIS — R112 Nausea with vomiting, unspecified: Secondary | ICD-10-CM | POA: Diagnosis present

## 2023-10-26 LAB — CBC
HCT: 41.7 % (ref 36.0–46.0)
Hemoglobin: 14.4 g/dL (ref 12.0–15.0)
MCH: 31 pg (ref 26.0–34.0)
MCHC: 34.5 g/dL (ref 30.0–36.0)
MCV: 89.9 fL (ref 80.0–100.0)
Platelets: 226 10*3/uL (ref 150–400)
RBC: 4.64 MIL/uL (ref 3.87–5.11)
RDW: 12.1 % (ref 11.5–15.5)
WBC: 4.6 10*3/uL (ref 4.0–10.5)
nRBC: 0 % (ref 0.0–0.2)

## 2023-10-26 LAB — COMPREHENSIVE METABOLIC PANEL
ALT: 71 U/L — ABNORMAL HIGH (ref 0–44)
AST: 128 U/L — ABNORMAL HIGH (ref 15–41)
Albumin: 3.9 g/dL (ref 3.5–5.0)
Alkaline Phosphatase: 52 U/L (ref 38–126)
Anion gap: 11 (ref 5–15)
BUN: 17 mg/dL (ref 6–20)
CO2: 23 mmol/L (ref 22–32)
Calcium: 8.8 mg/dL — ABNORMAL LOW (ref 8.9–10.3)
Chloride: 101 mmol/L (ref 98–111)
Creatinine, Ser: 0.77 mg/dL (ref 0.44–1.00)
GFR, Estimated: >60 mL/min (ref >60)
Glucose, Bld: 91 mg/dL (ref 70–99)
Potassium: 3.6 mmol/L (ref 3.5–5.1)
Sodium: 135 mmol/L (ref 135–145)
Total Bilirubin: 0.7 mg/dL (ref 0.0–1.2)
Total Protein: 6.8 g/dL (ref 6.5–8.1)

## 2023-10-26 LAB — RESP PANEL BY RT-PCR (RSV, FLU A&B, COVID)  RVPGX2
Influenza A by PCR: NEGATIVE
Influenza B by PCR: NEGATIVE
Resp Syncytial Virus by PCR: NEGATIVE
SARS Coronavirus 2 by RT PCR: NEGATIVE

## 2023-10-26 LAB — LIPASE, BLOOD: Lipase: 24 U/L (ref 11–51)

## 2023-10-26 LAB — MAGNESIUM: Magnesium: 2.1 mg/dL (ref 1.7–2.4)

## 2023-10-26 MED ORDER — DICYCLOMINE HCL 20 MG PO TABS
20.0000 mg | ORAL_TABLET | Freq: Two times a day (BID) | ORAL | 0 refills | Status: DC | PRN
Start: 2023-10-26 — End: 2024-07-03

## 2023-10-26 MED ORDER — SODIUM CHLORIDE 0.9 % IV BOLUS
1000.0000 mL | Freq: Once | INTRAVENOUS | Status: AC
  Administered 2023-10-26: 1000 mL via INTRAVENOUS

## 2023-10-26 MED ORDER — DICYCLOMINE HCL 10 MG PO CAPS
10.0000 mg | ORAL_CAPSULE | Freq: Once | ORAL | Status: AC
  Administered 2023-10-26: 10 mg via ORAL
  Filled 2023-10-26: qty 1

## 2023-10-26 MED ORDER — ONDANSETRON 4 MG PO TBDP: 4.0000 mg | ORAL_TABLET | Freq: Three times a day (TID) | ORAL | 0 refills | Status: DC | PRN

## 2023-10-26 MED ORDER — ONDANSETRON HCL 4 MG/2ML IJ SOLN
4.0000 mg | Freq: Once | INTRAMUSCULAR | Status: AC
  Administered 2023-10-26: 4 mg via INTRAVENOUS
  Filled 2023-10-26: qty 2

## 2023-10-26 MED ORDER — ONDANSETRON 4 MG PO TBDP: 8.0000 mg | ORAL_TABLET | Freq: Once | ORAL | Status: DC

## 2023-10-26 NOTE — Discharge Instructions (Signed)
 As discussed, your workup today was overall reassuring.  Will send you home with nausea medicine to use as needed.  Recommend over-the-counter Imodium for persistent/excessive diarrhea.  Recommend oral hydration via electrolyte rich fluids such as Pedialyte, sugar-free Gatorade, twice daily, Body Armor.  Will also attach guidelines for bland diet to your discharge papers.  Recommend follow-up with the primary care for reassessment.  Please do not hesitate to return if the worrisome signs and symptoms we discussed become apparent.

## 2023-10-26 NOTE — ED Triage Notes (Signed)
 C/o headaches, chills, n/v since yesterday. Denies CP or SHOB.

## 2023-10-26 NOTE — ED Provider Notes (Signed)
 [place] [facility] Provider Note   CSN: [id number] Arrival date & time: 10/26/23  1714     History  Chief Complaint  Patient presents with   Headache    Amy Walls is a 43 y.o. female.   Headache   43 year old female presents emergency department complaints of bodyaches, headache, nausea, vomiting, diarrhea.  Symptom onset around 3 PM yesterday.  Patient states that she works at [employer] with multiple potential sick exposures.  Denies any chest pain, shortness of breath, limited's, melena, and easy.  Does report cramping type abdominal pain preceding episodes of vomiting but not experienced otherwise.  Past medical history significant for bowel obstruction, chronic bronchitis, fibromyalgia, polysubstance use, intussusception, IBS, chronic hepatitis C, bipolar disorder  Home Medications Prior to Admission medications   Medication Sig Start Date End Date Taking? Authorizing Provider  dicyclomine (BENTYL) 20 MG tablet Take 1 tablet (20 mg total) by mouth 2 (two) times daily as needed. 10/26/23  Yes Sherian Maroon A, PA  ondansetron (ZOFRAN-ODT) 4 MG disintegrating tablet Take 1 tablet (4 mg total) by mouth every 8 (eight) hours as needed. 10/26/23  Yes Sherian Maroon A, PA  buprenorphine-naloxone (SUBOXONE) 8-2 mg SUBL SL tablet Place 1 tablet under the tongue 2 (two) times daily. 09/30/21   [provider]  doxycycline (VIBRAMYCIN) 100 MG capsule Take 1 capsule (100 mg total) by mouth 2 (two) times daily. One po bid x 7 days 10/18/23   Zadie Rhine, MD  naloxone [facility]) nasal spray 4 mg/0.1 mL Place 1 spray into the nose once as needed (opioid overdose).    [provider]      Allergies    Cinnamon, Levaquin [levofloxacin], Morphine and codeine, Penicillins, and Acetaminophen    Review of Systems   Review of Systems  Neurological:  Positive for headaches.  All other systems reviewed and are negative.   Physical  Exam Updated Vital Signs BP 104/72 (BP Location: Left Arm)   Pulse (!) 54   Temp 97.9 F (36.6 C) (Oral)   Resp 18   Ht 4\' 11"  (1.499 m)   Wt 49.9 kg   SpO2 100%   BMI 22.22 kg/m  Physical Exam Vitals and nursing note reviewed.  Constitutional:      General: She is not in acute distress.    Appearance: She is well-developed.  HENT:     Head: Normocephalic and atraumatic.  Eyes:     Conjunctiva/sclera: Conjunctivae normal.  Cardiovascular:     Rate and Rhythm: Normal rate and regular rhythm.     Heart sounds: No murmur heard. Pulmonary:     Effort: Pulmonary effort is normal. No respiratory distress.     Breath sounds: Normal breath sounds.  Abdominal:     General: There is no distension.     Palpations: Abdomen is soft. There is no mass.     Tenderness: There is no abdominal tenderness.  Musculoskeletal:        General: No swelling.     Cervical back: Neck supple.  Skin:    General: Skin is warm and dry.     Capillary Refill: Capillary refill takes less than 2 seconds.  Neurological:     Mental Status: She is alert.     Comments: Alert and oriented to self, place, time and event.   Speech is fluent, clear without dysarthria or dysphasia.   Strength 5/5 in upper/lower extremities   Sensation intact in upper/lower extremities   Normal  gait.  CN I not tested  CN II not tested CN III, IV, VI PERRLA and EOMs intact bilaterally  CN V Intact sensation to sharp and light touch to the face  CN VII facial movements symmetric  CN VIII not tested  CN IX, X no uvula deviation, symmetric rise of soft palate  CN XI 5/5 SCM and trapezius strength bilaterally  CN XII Midline tongue protrusion, symmetric L/R movements     Psychiatric:        Mood and Affect: Mood normal.     ED Results / Procedures / Treatments   Labs (all labs ordered are listed, but only abnormal results are displayed) Labs Reviewed  COMPREHENSIVE METABOLIC PANEL - Abnormal; Notable for the  following components:      Result Value   Calcium 8.8 (*)    AST 128 (*)    ALT 71 (*)    All other components within normal limits  RESP PANEL BY RT-PCR (RSV, FLU A&B, COVID)  RVPGX2  CBC  LIPASE, BLOOD  MAGNESIUM  PREGNANCY, URINE  URINALYSIS, ROUTINE W REFLEX MICROSCOPIC    EKG None  Radiology No results found.  Procedures Procedures    Medications Ordered in ED Medications  sodium chloride 0.9 % bolus 1,000 mL (1,000 mLs Intravenous New Bag/Given 10/26/23 1957)  ondansetron (ZOFRAN) injection 4 mg (4 mg Intravenous Given 10/26/23 1955)  dicyclomine (BENTYL) capsule 10 mg (10 mg Oral Given 10/26/23 1853)    ED Course/ Medical Decision Making/ A&P                                 Medical Decision Making Amount and/or Complexity of Data Reviewed Labs: ordered.  Risk Prescription drug management.   This patient presents to the ED for concern of cough, congestion, headache, nausea, vomit, diarrhea, this involves an extensive number of treatment options, and is a complaint that carries with it a high risk of complications and morbidity.  The differential diagnosis includes COVID, flu, RSV, viral gastroenteritis, especially sbo/lbo., volvulus, food borne illness, other   Co morbidities that complicate the patient evaluation  See HPI   Additional history obtained:  Additional history obtained from EMR External records from outside source obtained and reviewed including hospital records.   Lab Tests:  I Ordered, and personally interpreted labs.  The pertinent results include: Viral testing negative.  No leukocytosis.  No evidence of anemia.  Platelets within range.  Transaminitis AST of 128, ALT of 71.  No renal dysfunction.  Lipase within normal limits.   Imaging Studies ordered:  N/a   Cardiac Monitoring: / EKG:  The patient was maintained on a cardiac monitor.  I personally viewed and interpreted the cardiac monitored which showed an underlying rhythm of:  sinus rhythm   Consultations Obtained:  N/a   Problem List / ED Course / Critical interventions / Medication management  Nausea, vomiting, diarrhea, myalgia I ordered medication including 1 normal saline, Bentyl, Zofran   Reevaluation of the patient after these medicines showed that the patient improved I have reviewed the patients home medicines and have made adjustments as needed   Social Determinants of Health:  Polysubstance use   Test / Admission - Considered:  Nausea, vomiting, diarrhea, myalgia Vitals signs within normal range and stable throughout visit. Laboratory/imaging studies significant for: See above 43 year old female presents emergency department complaints of bodyaches, headache, nausea, vomiting, diarrhea.  Symptom onset around 3 PM yesterday.  Patient states that she works at [employer] with multiple potential sick exposures.  Denies any chest pain, shortness of breath, limited's, melena, and easy.  Does report cramping type abdominal pain preceding episodes of vomiting but not experienced otherwise.  Laboratory studies with slight transaminitis but seems to be somewhat chronic per chart review and could be because of patient's GI loss/dehydration.  Patient treated with antiemetic, IV fluids with significant improvement of symptoms.  Suspect patient symptoms likely secondary to viral GI illness/viral gastroenteritis given known exposure in the outpatient setting.  Will recommend dietary changes same antiemetic to use as needed.  Follow-up PCP recommended for reevaluation.  Treatment plan discussed at length with patient and she knowledge understanding was agreeable to said plan.  Patient overall well-appearing, afebrile in no acute distress, tolerating p.o. without difficulty. On exam, abdominal nontender on serial exams.  No evidence clinically meningismus.  No neurodeficits on exam.  Laboratory studies Worrisome signs and symptoms were discussed with the patient, and  the patient acknowledged understanding to return to the ED if noticed. Patient was stable upon discharge.          Final Clinical Impression(s) / ED Diagnoses Final diagnoses:  Nausea vomiting and diarrhea  Myalgia    Rx / DC Orders ED Discharge Orders     None         Peter Garter, [place] 10/26/23 2040    Virgina Norfolk, DO 10/26/23 2228

## 2023-10-26 NOTE — ED Notes (Signed)
 Provided pt with ginger ale and crackers. PO challenge completed w/o issue.

## 2024-07-03 ENCOUNTER — Other Ambulatory Visit: Payer: Self-pay

## 2024-07-03 ENCOUNTER — Encounter (HOSPITAL_BASED_OUTPATIENT_CLINIC_OR_DEPARTMENT_OTHER): Payer: Self-pay

## 2024-07-03 ENCOUNTER — Emergency Department (HOSPITAL_BASED_OUTPATIENT_CLINIC_OR_DEPARTMENT_OTHER): Payer: MEDICAID

## 2024-07-03 ENCOUNTER — Emergency Department (HOSPITAL_BASED_OUTPATIENT_CLINIC_OR_DEPARTMENT_OTHER)
Admission: EM | Admit: 2024-07-03 | Discharge: 2024-07-03 | Disposition: A | Discharge status: Home / Self Care | Payer: MEDICAID | Attending: Emergency Medicine | Admitting: Emergency Medicine

## 2024-07-03 DIAGNOSIS — R1032 Left lower quadrant pain: Secondary | ICD-10-CM | POA: Insufficient documentation

## 2024-07-03 DIAGNOSIS — J45909 Unspecified asthma, uncomplicated: Secondary | ICD-10-CM | POA: Diagnosis not present

## 2024-07-03 LAB — PREGNANCY, URINE: Preg Test, Ur: NEGATIVE

## 2024-07-03 LAB — URINALYSIS, ROUTINE W REFLEX MICROSCOPIC
Bilirubin Urine: NEGATIVE
Glucose, UA: NEGATIVE mg/dL
Ketones, ur: NEGATIVE mg/dL
Nitrite: NEGATIVE
Protein, ur: NEGATIVE mg/dL
Specific Gravity, Urine: >=1.03 (ref 1.005–1.030)
pH: 6 (ref 5.0–8.0)

## 2024-07-03 LAB — LIPASE, BLOOD: Lipase: 32 U/L (ref 11–51)

## 2024-07-03 LAB — CBC
HCT: 40.4 % (ref 36.0–46.0)
Hemoglobin: 13.9 g/dL (ref 12.0–15.0)
MCH: 30.2 pg (ref 26.0–34.0)
MCHC: 34.4 g/dL (ref 30.0–36.0)
MCV: 87.8 fL (ref 80.0–100.0)
Platelets: 220 K/uL (ref 150–400)
RBC: 4.6 MIL/uL (ref 3.87–5.11)
RDW: 11.7 % (ref 11.5–15.5)
WBC: 8.3 K/uL (ref 4.0–10.5)
nRBC: 0 % (ref 0.0–0.2)

## 2024-07-03 LAB — COMPREHENSIVE METABOLIC PANEL WITH GFR
ALT: 67 U/L — ABNORMAL HIGH (ref 0–44)
AST: 124 U/L — ABNORMAL HIGH (ref 15–41)
Albumin: 4.1 g/dL (ref 3.5–5.0)
Alkaline Phosphatase: 64 U/L (ref 38–126)
Anion gap: 9 (ref 5–15)
BUN: 11 mg/dL (ref 6–20)
CO2: 26 mmol/L (ref 22–32)
Calcium: 9.2 mg/dL (ref 8.9–10.3)
Chloride: 104 mmol/L (ref 98–111)
Creatinine, Ser: 0.69 mg/dL (ref 0.44–1.00)
GFR, Estimated: >60 mL/min (ref >60)
Glucose, Bld: 96 mg/dL (ref 70–99)
Potassium: 4.2 mmol/L (ref 3.5–5.1)
Sodium: 139 mmol/L (ref 135–145)
Total Bilirubin: 0.3 mg/dL (ref 0.0–1.2)
Total Protein: 6.6 g/dL (ref 6.5–8.1)

## 2024-07-03 LAB — URINALYSIS, MICROSCOPIC (REFLEX)

## 2024-07-03 MED ORDER — ONDANSETRON 4 MG PO TBDP
4.0000 mg | ORAL_TABLET | Freq: Three times a day (TID) | ORAL | 0 refills | Status: DC | PRN
  Filled 2024-07-03: qty 20, 7d supply, fill #0

## 2024-07-03 MED ORDER — IOHEXOL 300 MG/ML  SOLN
75.0000 mL | Freq: Once | INTRAMUSCULAR | Status: AC | PRN
  Administered 2024-07-03: 75 mL via INTRAVENOUS

## 2024-07-03 MED ORDER — IOHEXOL 300 MG/ML  SOLN
15.0000 mL | Freq: Once | INTRAMUSCULAR | Status: AC | PRN
  Administered 2024-07-03: 15 mL via ORAL

## 2024-07-03 MED ORDER — DICYCLOMINE HCL 20 MG PO TABS
20.0000 mg | ORAL_TABLET | Freq: Three times a day (TID) | ORAL | 0 refills | Status: AC | PRN
  Filled 2024-07-03 – 2024-07-04 (×2): qty 20, 7d supply, fill #0

## 2024-07-03 MED ORDER — SENNOSIDES-DOCUSATE SODIUM 8.6-50 MG PO TABS
1.0000 | ORAL_TABLET | Freq: Every evening | ORAL | 0 refills | Status: DC | PRN
  Filled 2024-07-03: qty 100, 100d supply, fill #0

## 2024-07-03 NOTE — ED Triage Notes (Signed)
 LLQ and L flank pain, N/V, dysuria for 3 days. Denies hematuria, fevers

## 2024-07-03 NOTE — Discharge Instructions (Signed)

## 2024-07-03 NOTE — ED Provider Notes (Signed)
 Emergency Department Provider Note   I have reviewed the triage vital signs and the nursing notes.   HISTORY  Chief Complaint Abdominal Pain and Flank Pain   HPI Amy Walls is a 43 y.o. female with past history reviewed below presents emergency department for evaluation of left lower quadrant abdominal pain now radiating to the left flank.  She has associated nausea and vomiting with dysuria for the past 3 days.  No hematuria or fever.  No chest pain or shortness of breath.  No vaginal bleeding or discharge. She is passing stool and flatus without difficulty.    Past Medical History:  Diagnosis Date   ADHD (attention deficit hyperactivity disorder)    Anxiety    Arthritis    inflammation of all my joints   Asthma    Bowel obstruction (HCC) 2007   upper bowel   Chronic bronchitis    yearly   Chronic mid back pain    Complication of anesthesia 2007   lost me on the table when I had upper bowel obstruction   Constipation, chronic    Depression    Fibromyalgia    Hepatitis C    Heroin abuse (HCC)    Hypoglycemia    my sugar runs low alot   IV drug user    Kidney infection    often   Migraines 12/28/11   @ least once/wk   PONV (postoperative nausea and vomiting)    Recurrent UTI     Review of Systems  Constitutional: No fever/chills Cardiovascular: Denies chest pain. Respiratory: Denies shortness of breath. Gastrointestinal: Positive abdominal pain. Positive nausea and vomiting.  No diarrhea.  No constipation. Genitourinary: Positive for dysuria. Musculoskeletal: Negative for back pain. Skin: Negative for rash. Neurological: Negative for headaches, focal weakness or numbness.   ____________________________________________   PHYSICAL EXAM:  VITAL SIGNS: ED Triage Vitals  Encounter Vitals Group     BP 07/03/24 1700 (!) 121/101     Pulse Rate 07/03/24 1700 69     Resp 07/03/24 1700 18     Temp 07/03/24 1700 98.4 F (36.9 C)     Temp  Source 07/03/24 1700 Oral     SpO2 07/03/24 1700 100 %   Constitutional: Alert and oriented. Well appearing and in no acute distress. Eyes: Conjunctivae are normal.  Head: Atraumatic. Nose: No congestion/rhinnorhea. Mouth/Throat: Mucous membranes are moist.   Neck: No stridor.   Cardiovascular: Normal rate, regular rhythm. Good peripheral circulation. Grossly normal heart sounds.   Respiratory: Normal respiratory effort.  No retractions. Lungs CTAB. Gastrointestinal: Soft with focal tenderness in the LLQ. No distention.  Musculoskeletal: No lower extremity tenderness nor edema. No gross deformities of extremities. Neurologic:  Normal speech and language.  Skin:  Skin is warm, dry and intact. No rash noted.   ____________________________________________   LABS (all labs ordered are listed, but only abnormal results are displayed)  Labs Reviewed  COMPREHENSIVE METABOLIC PANEL WITH GFR - Abnormal; Notable for the following components:      Result Value   AST 124 (*)    ALT 67 (*)    All other components within normal limits  URINALYSIS, ROUTINE W REFLEX MICROSCOPIC - Abnormal; Notable for the following components:   APPearance HAZY (*)    Hgb urine dipstick TRACE (*)    Leukocytes,Ua TRACE (*)    All other components within normal limits  URINALYSIS, MICROSCOPIC (REFLEX) - Abnormal; Notable for the following components:   Bacteria, UA MANY (*)  All other components within normal limits  LIPASE, BLOOD  CBC  PREGNANCY, URINE    ____________________________________________   PROCEDURES  Procedure(s) performed:   Procedures  None ____________________________________________   INITIAL IMPRESSION / ASSESSMENT AND PLAN / ED COURSE  Pertinent labs & imaging results that were available during my care of the patient were reviewed by me and considered in my medical decision making (see chart for details).   This patient is Presenting for Evaluation of abdominal pain,  which does require a range of treatment options, and is a complaint that involves a high risk of morbidity and mortality.  The Differential Diagnoses includes but is not exclusive to ectopic pregnancy, ovarian cyst, ovarian torsion, acute appendicitis, urinary tract infection, endometriosis, bowel obstruction, hernia, colitis, renal colic, gastroenteritis, volvulus etc.   Critical Interventions-    Medications  iohexol  (OMNIPAQUE ) 300 MG/ML solution 75 mL (75 mLs Intravenous Contrast Given 07/03/24 2044)  iohexol  (OMNIPAQUE ) 300 MG/ML solution 15 mL (15 mLs Oral Contrast Given 07/03/24 2047)    Reassessment after intervention: pain improved.    Clinical Laboratory Tests Ordered, included UA with trace leukocytes and many bacteria.  CBC without leukocytosis.  No acute kidney injury.  Normal bilirubin and lipase with mild elevation in AST/ALT consistent with prior values.   Radiologic Tests Ordered, included CT abdomen/pelvis. I independently interpreted the images and agree with radiology interpretation.   Cardiac Monitor Tracing which shows NSR.    Social Determinants of Health Risk patient is a non-smoker.  Medical Decision Making: Summary:  Patient presents to the emergency department for evaluation of abdominal pain.  Focal tenderness on exam.  Plan for CT abdomen pelvis and reassess.  Reevaluation with update and discussion with patient.  CT without acute findings.  Plan for aggressive stool regimen at home and Bentyl  for pain along with Zofran  for nausea.   Considered admission but symptoms well controlled and exam/imaging is reassuring.   Patient's presentation is most consistent with acute presentation with potential threat to life or bodily function.   Disposition: discharge  ____________________________________________  FINAL CLINICAL IMPRESSION(S) / ED DIAGNOSES  Final diagnoses:  Left lower quadrant abdominal pain     NEW OUTPATIENT MEDICATIONS STARTED DURING  THIS VISIT:  Discharge Medication List as of 07/03/2024  9:25 PM     START taking these medications   Details  senna-docusate (SENOKOT-S) 8.6-50 MG tablet Take 1 tablet by mouth at bedtime as needed for mild constipation., Starting Tue 07/03/2024, Normal        Note:  This document was prepared using Dragon voice recognition software and may include unintentional dictation errors.  Fonda Law, MD, [facility] Emergency Medicine    Carolan Avedisian, Fonda MATSU, MD 07/10/24 1048

## 2024-07-03 NOTE — ED Notes (Signed)
 ED Provider at bedside.

## 2024-07-04 ENCOUNTER — Other Ambulatory Visit (HOSPITAL_BASED_OUTPATIENT_CLINIC_OR_DEPARTMENT_OTHER): Payer: Self-pay

## 2024-07-05 ENCOUNTER — Other Ambulatory Visit (HOSPITAL_BASED_OUTPATIENT_CLINIC_OR_DEPARTMENT_OTHER): Payer: Self-pay

## 2024-07-13 ENCOUNTER — Ambulatory Visit (INDEPENDENT_AMBULATORY_CARE_PROVIDER_SITE_OTHER): Payer: MEDICAID | Admitting: Family Medicine

## 2024-07-13 ENCOUNTER — Encounter: Payer: Self-pay | Admitting: Family Medicine

## 2024-07-13 ENCOUNTER — Other Ambulatory Visit: Payer: Self-pay

## 2024-07-13 ENCOUNTER — Other Ambulatory Visit (HOSPITAL_BASED_OUTPATIENT_CLINIC_OR_DEPARTMENT_OTHER): Payer: Self-pay

## 2024-07-13 VITALS — BP 110/67 | HR 65 | Ht 59.0 in | Wt 109.0 lb

## 2024-07-13 DIAGNOSIS — G43009 Migraine without aura, not intractable, without status migrainosus: Secondary | ICD-10-CM | POA: Insufficient documentation

## 2024-07-13 DIAGNOSIS — R748 Abnormal levels of other serum enzymes: Secondary | ICD-10-CM | POA: Diagnosis not present

## 2024-07-13 DIAGNOSIS — B182 Chronic viral hepatitis C: Secondary | ICD-10-CM

## 2024-07-13 DIAGNOSIS — E559 Vitamin D deficiency, unspecified: Secondary | ICD-10-CM | POA: Diagnosis not present

## 2024-07-13 DIAGNOSIS — L659 Nonscarring hair loss, unspecified: Secondary | ICD-10-CM

## 2024-07-13 DIAGNOSIS — R194 Change in bowel habit: Secondary | ICD-10-CM | POA: Diagnosis not present

## 2024-07-13 DIAGNOSIS — Z1231 Encounter for screening mammogram for malignant neoplasm of breast: Secondary | ICD-10-CM

## 2024-07-13 DIAGNOSIS — F3160 Bipolar disorder, current episode mixed, unspecified: Secondary | ICD-10-CM | POA: Diagnosis not present

## 2024-07-13 DIAGNOSIS — F32A Depression, unspecified: Secondary | ICD-10-CM | POA: Insufficient documentation

## 2024-07-13 DIAGNOSIS — Z0000 Encounter for general adult medical examination without abnormal findings: Secondary | ICD-10-CM

## 2024-07-13 DIAGNOSIS — Z800 Family history of malignant neoplasm of digestive organs: Secondary | ICD-10-CM | POA: Diagnosis not present

## 2024-07-13 DIAGNOSIS — F419 Anxiety disorder, unspecified: Secondary | ICD-10-CM

## 2024-07-13 DIAGNOSIS — F1911 Other psychoactive substance abuse, in remission: Secondary | ICD-10-CM | POA: Insufficient documentation

## 2024-07-13 DIAGNOSIS — J3489 Other specified disorders of nose and nasal sinuses: Secondary | ICD-10-CM

## 2024-07-13 LAB — COMPREHENSIVE METABOLIC PANEL WITH GFR
ALT: 100 U/L — ABNORMAL HIGH (ref 0–35)
AST: 152 U/L — ABNORMAL HIGH (ref 0–37)
Albumin: 4.7 g/dL (ref 3.5–5.2)
Alkaline Phosphatase: 73 U/L (ref 39–117)
BUN: 23 mg/dL (ref 6–23)
CO2: 31 meq/L (ref 19–32)
Calcium: 9.5 mg/dL (ref 8.4–10.5)
Chloride: 100 meq/L (ref 96–112)
Creatinine, Ser: 0.9 mg/dL (ref 0.40–1.20)
GFR: 79.27 mL/min (ref >60.00)
Glucose, Bld: 64 mg/dL — ABNORMAL LOW (ref 70–99)
Potassium: 4.3 meq/L (ref 3.5–5.1)
Sodium: 138 meq/L (ref 135–145)
Total Bilirubin: 0.4 mg/dL (ref 0.2–1.2)
Total Protein: 7.6 g/dL (ref 6.0–8.3)

## 2024-07-13 LAB — VITAMIN D 25 HYDROXY (VIT D DEFICIENCY, FRACTURES): VITD: 18.38 ng/mL — ABNORMAL LOW (ref 30.00–100.00)

## 2024-07-13 LAB — TSH: TSH: 4.5 u[IU]/mL (ref 0.35–5.50)

## 2024-07-13 MED ORDER — BUPROPION HCL ER (XL) 150 MG PO TB24
150.0000 mg | ORAL_TABLET | Freq: Every day | ORAL | 1 refills | Status: AC
  Filled 2024-07-13 (×2): qty 90, 90d supply, fill #0

## 2024-07-13 MED ORDER — RIZATRIPTAN BENZOATE 10 MG PO TABS
10.0000 mg | ORAL_TABLET | ORAL | 0 refills | Status: AC | PRN
  Filled 2024-07-13: qty 10, 30d supply, fill #0

## 2024-07-13 NOTE — Assessment & Plan Note (Signed)
 PHQ9/GAD7 reviewed. No SI/HI/  Limited options given complex conditions and suboxone use. Will trial Wellbutrin  and place referral to psychiatry for med adjustment. She is not interested in counseling right now.

## 2024-07-13 NOTE — Assessment & Plan Note (Signed)
 Refer to psychiatry. NO SI/HI today.

## 2024-07-13 NOTE — Assessment & Plan Note (Signed)
 Migraines occurring once to twice a month, last episode lasting four days. - Prescribed Maxalt  for abortive therapy  - Advised to monitor frequency and severity of migraines.

## 2024-07-13 NOTE — Assessment & Plan Note (Signed)
-   Continue Suboxone therapy as prescribed by [facility].

## 2024-07-13 NOTE — Progress Notes (Signed)
 New Patient Office Visit   Subjective     Patient ID: Amy Walls, female   DOB: [birth year]  Age: 43 y.o. MRN: [id number]   CC:  Chief Complaint  Patient presents with   Establish Care      HPI Amy Walls presents to establish care. She was recently seen in the emergency department on 07/03/2024 for LLQ abdominal pain associated with nausea, vomiting, and dysuria. Labs with trace leukocytes and many bacteria on UA, no leukocytosis on CBC, no AKI, normal bilirubin and lipase with mildly elevated AST/ALT. CT showed no acute findings. She was discharged with Senokot, Bentyl , and Zofran .    Discussed the use of AI scribe software for clinical note transcription with the patient, who gave verbal consent to proceed.  History of Present Illness Amy Walls is a 43 year old female presenting to establish care.  She has difficulty maintaining employment due mental health issues. She previously worked at [employer] for three years and then at [employer], where she experienced physical discomfort and mental health challenges. She has not had a psychiatrist and was previously on medications, including Prozac while incarcerated. She discontinued Prozac after an incident where she woke up face down in a cake. She is currently on Suboxone, taking 8-2 mg tablets three times a day, prescribed by Ronal Jenkins Houseman at [facility].  She has a history of ADHD, anxiety, depression, PTSD, and bipolar disorder, with both manic and depressive episodes. She recalls being treated for these conditions while incarcerated but does not remember specific medications. She has not been treated for ADHD outside of incarceration.  She has hepatitis C, diagnosed in 2014, and has not completed treatment. She attempted to start treatment through a grant, but it was canceled due to issues with the doctor involved. She reports hair loss and has a history of elevated liver enzymes. She has not used IV drugs for over three years.  She  experiences migraines approximately once or twice a month, with the last episode occurring three weeks ago and lasting four days. She has used migraine medication in the past, which was effective but caused drowsiness. She does not recall the name of the medication.  She reports recent stomach issues and was seen for this four to five days ago, receiving a CT scan and medications including Bentyl  and Zofran . She reports changes in her bowel habits and has a family history of colon cancer, with her grandmother and aunt affected. States she has never had a colonoscopy.  Her family history includes a mother with diabetes, a father with heart disease, and a maternal grandmother with multiple cancers, including colon cancer. Her aunt was diagnosed with colon cancer in her sixties.  She does not consume alcohol or use drugs currently, having quit in 2022. She uses a vape and has an arm implant for birth control. She is allergic to cinnamon, Levaquin, morphine , codeine, penicillin, and acetaminophen .  She reports a sore nose and facial irritation, for which she received a prescription for mupirocin ointment and Flonase from [facility]. The condition started in her nose and is very sore, with concerns about MRSA.           07/13/2024   12:07 PM  PHQ9 SCORE ONLY  PHQ-9 Total Score 17      07/13/2024   12:07 PM  GAD 7 : Generalized Anxiety Score  Nervous, Anxious, on Edge 2  Control/stop worrying 2  Worry too much - different things 2  Trouble relaxing 0  Restless 0  Easily annoyed or irritable 3  Afraid - awful might happen 3  Total GAD 7 Score 12  Anxiety Difficulty Not difficult at all         Outpatient Medications Prior to Visit  Medication Sig   buprenorphine-naloxone (SUBOXONE) 8-2 mg SUBL SL tablet Place 1 tablet under the tongue 2 (two) times daily.   dicyclomine  (BENTYL ) 20 MG tablet Take 1 tablet (20 mg total) by mouth 3 (three) times daily as needed.    [DISCONTINUED] doxycycline  (VIBRAMYCIN ) 100 MG capsule Take 1 capsule (100 mg total) by mouth 2 (two) times daily. One po bid x 7 days   [DISCONTINUED] naloxone (NARCAN) nasal spray 4 mg/0.1 mL Place 1 spray into the nose once as needed (opioid overdose).   [DISCONTINUED] ondansetron  (ZOFRAN -ODT) 4 MG disintegrating tablet Take 1 tablet (4 mg total) by mouth every 8 (eight) hours as needed.   [DISCONTINUED] senna-docusate (SENOKOT-S) 8.6-50 MG tablet Take 1 tablet by mouth at bedtime as needed for mild constipation.   No facility-administered medications prior to visit.   Past Medical History:  Diagnosis Date   ADHD (attention deficit hyperactivity disorder)    Anxiety    Arthritis    inflammation of all my joints   Asthma    Bipolar 1 disorder, mixed (HCC)    Bowel obstruction (HCC) 2007   upper bowel   Chronic bronchitis    yearly   Chronic mid back pain    Complication of anesthesia 2007   lost me on the table when I had upper bowel obstruction   Constipation, chronic    Depression    Fibromyalgia    Hepatitis C    Heroin abuse (HCC)    Hypoglycemia    my sugar runs low alot   IV drug user    Kidney infection    often   Migraines 12/28/2011   @ least once/wk   PONV (postoperative nausea and vomiting)    PTSD (post-traumatic stress disorder)    Recurrent UTI     Past Surgical History:  Procedure Laterality Date   BOWEL RESECTION  04/2006   upper bowel   OVARY SURGERY  ~ 2002   had 10 inches blood built up; they opened me up and got the blood out     Family History  Problem Relation Age of Onset   Diabetes Mother    Heart disease Father    Breast cancer Maternal Grandmother    Colon cancer Maternal Grandmother    Irritable bowel syndrome Maternal Grandmother    Lung cancer Maternal Grandmother    Vision loss Maternal Grandfather    Cancer Maternal Aunt        colon cancer     Social History   Socioeconomic History   Marital status:  Single    Spouse name: Not on file   Number of children: 0   Years of education: Not on file   Highest education level: Not on file  Occupational History   Occupation: unemployeed  Tobacco Use   Smoking status: Former    Current packs/day: 0.00    Average packs/day: 0.5 packs/day for 20.0 years (10.0 ttl pk-yrs)    Types: Cigarettes, E-cigarettes    Start date: 2003    Quit date: 2023    Years since quitting: 2.8   Smokeless tobacco: Never  Vaping Use   Vaping status: Every Day  Substance and Sexual Activity   Alcohol use: Not Currently  Drug use: Not Currently    Types: Cocaine, Heroin, Other-see comments    Comment: quit 2022   Sexual activity: Not on file  Other Topics Concern   Not on file  Social History Narrative   Not on file   Social Drivers of Health   Financial Resource Strain: Not on file  Food Insecurity: Not on file  Transportation Needs: Not on file  Physical Activity: Not on file  Stress: Not on file  Social Connections: Not on file       ROS All review of systems negative except what is listed in the HPI    Objective     BP 110/67   Pulse 65   Ht 4' 11 (1.499 m)   Wt 109 lb (49.4 kg)   SpO2 98%   BMI 22.02 kg/m   Physical Exam Vitals reviewed.  Constitutional:      Appearance: Normal appearance.  HENT:     Head:     Comments: Nasal irritation/blister in both nares and scab between lip and nose Cardiovascular:     Rate and Rhythm: Normal rate and regular rhythm.     Heart sounds: Normal heart sounds.  Pulmonary:     Effort: Pulmonary effort is normal.     Breath sounds: Normal breath sounds.  Skin:    General: Skin is warm and dry.  Neurological:     Mental Status: She is alert and oriented to person, place, and time.  Psychiatric:        Mood and Affect: Mood normal.        Behavior: Behavior normal.        Thought Content: Thought content normal.        Judgment: Judgment normal.         Assessment & Plan:      Problem List Items Addressed This Visit       Active Problems   Chronic hepatitis C without hepatic coma (HCC)   Hepatitis C diagnosed in 2014, untreated due to previous treatment program issues. Elevated liver enzymes and hair loss noted.  - Referred to liver clinic for evaluation and treatment. - Ordered updated hepatitis testing including hepatitis A, B, and C. - Ordered liver function tests and thyroid  function tests.      Bipolar disorder (HCC)   Refer to psychiatry. NO SI/HI today.      Relevant Orders   Ambulatory referral to Psychiatry   Migraine without aura and without status migrainosus, not intractable   Migraines occurring once to twice a month, last episode lasting four days. - Prescribed Maxalt  for abortive therapy  - Advised to monitor frequency and severity of migraines.      Relevant Medications   buPROPion  (WELLBUTRIN  XL) 150 MG 24 hr tablet   rizatriptan  (MAXALT ) 10 MG tablet   Anxiety and depression   PHQ9/GAD7 reviewed. No SI/HI/  Limited options given complex conditions and suboxone use. Will trial Wellbutrin  and place referral to psychiatry for med adjustment. She is not interested in counseling right now.       Relevant Medications   buPROPion  (WELLBUTRIN  XL) 150 MG 24 hr tablet   Vitamin D  deficiency   No current supplement. Labs today.       Relevant Orders   Vitamin D  (25 hydroxy)   History of drug abuse (HCC)   - Continue Suboxone therapy as prescribed by [facility].      Other Visit Diagnoses       Encounter for medical  examination to establish care    -  Primary     Bowel habit changes     Change in bowel habits with family history of colon cancer. Recent CT scan showed fatty liver but no acute findings.  - Referred to GI for evaluation and potential early colonoscopy due to family history of colon cancer.   Relevant Orders   Ambulatory referral to Gastroenterology     Family history of colon cancer     See above   Relevant Orders    Ambulatory referral to Gastroenterology     Hair thinning     Check thyroid  levels   Relevant Orders   TSH     Elevated liver enzymes     Labs today. Referral to liver clinic.    Relevant Orders   Comprehensive metabolic panel with GFR   Ambulatory referral to Gastroenterology   Hepatitis A antibody, IgM   Hepatitis B core antibody, IgM   Hepatitis B surface antibody,quantitative   HCV RNA quant   Hepatitis B surface antigen   Hepatitis C antibody     Lesion of nasal cavity     Suspected MRSA infection of the nose and face with soreness and leakage. Treatment initiated with  mupirocin ointment by another provider. - Continue ointment for at least 5 days. - Advised wearing a mask and washing hands thoroughly, especially when around food. - Instructed to cover the affected area when around people. - Advised to return if no improvement after treatment.     Encounter for screening mammogram for malignant neoplasm of breast       Relevant Orders   MM 3D SCREENING MAMMOGRAM BILATERAL BREAST               Return in about 3 months (around 10/13/2024) for chronic disease management.  Waddell KATHEE Mon, NP  I,Emily Lagle,acting as a scribe for Waddell KATHEE Mon, NP.,have documented all relevant documentation on the behalf of Waddell KATHEE Mon, NP.  I, Waddell KATHEE Mon, NP, have reviewed all documentation for this visit. The documentation on 07/13/2024 for the exam, diagnosis, procedures, and orders are all accurate and complete.

## 2024-07-13 NOTE — Assessment & Plan Note (Signed)
No current supplement  Labs today

## 2024-07-13 NOTE — Assessment & Plan Note (Signed)
 Hepatitis C diagnosed in 2014, untreated due to previous treatment program issues. Elevated liver enzymes and hair loss noted.  - Referred to liver clinic for evaluation and treatment. - Ordered updated hepatitis testing including hepatitis A, B, and C. - Ordered liver function tests and thyroid  function tests.

## 2024-07-13 NOTE — Patient Instructions (Signed)

## 2024-07-15 LAB — HCV RNA QUANT
HCV log10: 6.104 {Log_IU}/mL
Hepatitis C Quantitation: 1270000 [IU]/mL

## 2024-07-16 ENCOUNTER — Ambulatory Visit: Payer: Self-pay | Admitting: Family Medicine

## 2024-07-16 ENCOUNTER — Telehealth: Payer: Self-pay | Admitting: Neurology

## 2024-07-16 NOTE — Telephone Encounter (Signed)
 Copied from CRM #[id number]. Topic: General - Other >> Jul 16, 2024  9:39 AM Nessti S wrote: Reason for CRM: pt called because liver specialist needed more info concerning lab results. Call back back for pt [phone] >> Jul 16, 2024  2:14 PM Franky GRADE wrote: [facility] is calling to inform that all they need is some kind of diagnostic or lab result to go along with the diagnosis on the referral they received. At the moment they have no supporting documentation to go along with the diagnosis. Unfortuently they will close the referral tomorrow if they don't receive the supporting documentation.   Phone: [phone] Fax:[phone]

## 2024-07-16 NOTE — Telephone Encounter (Signed)
 Copied from CRM #[id number]. Topic: General - Other >> Jul 16, 2024  9:39 AM Nessti S wrote: Reason for CRM: pt called because liver specialist needed more info concerning lab results. Call back back for pt [phone]

## 2024-07-16 NOTE — Telephone Encounter (Signed)
 Records faxed.

## 2024-07-16 NOTE — Telephone Encounter (Signed)
 Called patient, she states they didn't tell her what they need, they just told her they needed more information before they could schedule her. Have we sent information over on her?

## 2024-07-17 ENCOUNTER — Other Ambulatory Visit: Payer: Self-pay

## 2024-07-17 DIAGNOSIS — B182 Chronic viral hepatitis C: Secondary | ICD-10-CM

## 2024-07-17 DIAGNOSIS — R748 Abnormal levels of other serum enzymes: Secondary | ICD-10-CM

## 2024-07-18 LAB — HCV RNA,QUANTITATIVE REAL TIME PCR
HCV Quantitative Log: 5.81 {Log_IU}/mL — ABNORMAL HIGH
HCV RNA, PCR, QN: 652000 [IU]/mL — ABNORMAL HIGH

## 2024-07-18 LAB — HEPATITIS B SURFACE ANTIGEN: Hepatitis B Surface Ag: NONREACTIVE

## 2024-07-18 LAB — HEPATITIS B SURFACE ANTIBODY, QUANTITATIVE: Hep B S AB Quant (Post): >1000 m[IU]/mL (ref >=10)

## 2024-07-18 LAB — HEPATITIS B CORE ANTIBODY, IGM: Hep B C IgM: NONREACTIVE

## 2024-07-18 LAB — HEPATITIS A ANTIBODY, IGM: Hep A IgM: NONREACTIVE

## 2024-07-18 LAB — HEPATITIS C ANTIBODY: Hepatitis C Ab: REACTIVE — AB

## 2024-07-27 ENCOUNTER — Telehealth: Payer: Self-pay

## 2024-07-27 ENCOUNTER — Other Ambulatory Visit (HOSPITAL_COMMUNITY): Payer: Self-pay

## 2024-07-27 NOTE — Telephone Encounter (Signed)
 [facility] Patient Advocate Encounter  Insurance verification completed.    The patient is uninsured and will need patient assistance for medication.  We can complete the application and will need to meet with the patient for signatures and income documentation.

## 2024-08-01 ENCOUNTER — Other Ambulatory Visit (HOSPITAL_COMMUNITY): Payer: Self-pay

## 2024-08-06 ENCOUNTER — Ambulatory Visit: Payer: MEDICAID | Admitting: Infectious Diseases

## 2024-08-06 ENCOUNTER — Other Ambulatory Visit: Payer: Self-pay

## 2024-08-06 ENCOUNTER — Encounter: Payer: Self-pay | Admitting: Infectious Diseases

## 2024-08-06 VITALS — BP 120/76 | HR 73 | Temp 98.9°F | Wt 116.0 lb

## 2024-08-06 DIAGNOSIS — K760 Fatty (change of) liver, not elsewhere classified: Secondary | ICD-10-CM

## 2024-08-06 DIAGNOSIS — B182 Chronic viral hepatitis C: Secondary | ICD-10-CM | POA: Diagnosis not present

## 2024-08-06 NOTE — Patient Instructions (Signed)
 Nice to meet you today!    We need to get a little more information about your hepatitis c infection before we start your treatment. I anticipate that we can get you started in a few weeks after we submit approval to your insurance to ensure payment.   We will go ahead and proceed with an ultrasound that looks at the liver tissue and stiffness which helps with decisions about liver health long term after we cure the infection.     ABOUT HEPATITIS C VIRUS:  Chronic Hepatitis C is the most common blood-borne infection in the [place] , affecting approximately 3 million people.  It is the leading cause of cirrhosis, liver cancer, and end stage liver disease requiring transplantation when this infection goes untreated for many years  The majority of people who are infected are unaware because there are not many early symptoms that are specific to this and often go undiagnosed until a specific blood test is drawn.   The hepatitis c virus is passed primarily through direct exposure of contaminated blood or body fluids. It is most efficiently transmitted through repeated exposure to infected blood.  Risk for sexual transmission is very low but is possible if there is high frequency of unprotected sexual activity with known hepatitis c partner or multiple partners of known status.  Over time, approximately 60-70% of people can develop some degree of liver disease. Cirrhosis occurs in 10-20% of those with chronic infection. 1-5% will get liver cancer, which has a very high rate of death.   Approximately 15-25% clear the infection without medication (usually in the first 6 months of becoming exposed to virus)  Newer medications provide over 95% cure rate when taken as prescribed    IN GENERAL ABOUT DIET  Persons living with chronic hepatitis c infection should eat a diet to maintain a healthy weight and avoid nutritional deficiencies.   Completely avoiding alcohol is the best decision for  your liver health. If unable to do so please limit alcohol to as little as possible to less than 1 standard drink a day - this is very irritating to your liver.  Limit tylenol  use to less than 2,000 mg daily (two extra strength tablets only twice a day)  If you have cirrhosis of the liver please take no more than 1,000 mg tylenol  a day  Patients with cirrhosis should not have protein restriction; we recommend a protein intake of approximately 1.2-1.5 g/kg/day.   For patients with cirrhosis and hepatic encephalopathy, the American Association for the Study of Liver Diseases (AASLD) recommended protein intake is 1.2-1.5 g/kg/day.  If you experience ascites (fluid accumulation in the abdomen associated with severe liver damage / cirrhosis) please limit sodium intake to < 2000 mg a day    UNTIL YOU HAVE BEEN TREATED AND CURED:  Use condoms with all sexual encounters or practice abstinence to avoid sexual transmission   No sharing of razors, toothbrushes, nail clippers or anything that could potentially have blood on it.   If you cut yourself please clean and cover any wounds or open sores to others do not come into contact with your blood.   If blood spills onto item/surface please clean with 1:10 bleach solution and allow to dry, EVEN if it is dried blood.    GENERAL HELPFUL HINTS ON HCV THERAPY:  1. Stay well-hydrated.  2. Notify the ID Clinic of any changes in your other over-the-counter/herbal or prescription medications.  3. If you miss a dose of your medication,  take the missed dose as soon as you remember. Return to your regular time/dose schedule the next day.   4.  Do not stop taking your medications without first talking with your healthcare provider.  5.  You will see our pharmacist-specialist within the first 2 weeks of starting your medication to monitor for any possible side effects.  6.  You will have blood work once during treatment 4 weeks after your first pill.  Again soon after treatment is completed and one final lab 3 months after your last pill to ensure cure!   TIPS TO BE SUCCESSFUL WITH DAILY MEDICATION USE:  1. Set a reminder on your phone  2. Try filling out a pill box for the week - pick a day and put one pill for every day during the week so you know right away if you missed a pill.   3. Have a trusted family member ask you about your medications.   4. Smartphone app    Medication we would like to use for you will be :  Mavyret Instructions:  Take Mavyret, three tablets (at the same time) daily with food. Please take ALL THREE PILLS AT ONCE. You should take it at approximately the same time every day. Treatment will be for 8 weeks. Do not miss a dose.    Do not run out of Mavyret! If you are down to one week of medication left and have not heard about your next shipment, please let us  know as soon as possible. You will be given 28 days of treatment at a time and will receive one refill.   If you need to start a new medication, prescription from your doctor or over the counter medication, you need to contact us  to make sure it does not interfere with Mavyret. There are several medications that can interfere with Mavyret and can make you sick or make the medication not work.  If you need to take a medication for acid reflux, you can take omeprazole 20mg  daily.     Tylenol  (acetominophen) and Advil  (ibuprofen ) are safe to take with Harvoni if needed for headache, fever, pain.   IF YOU ARE ON BIRTH CONTROL PILLS, YOU RECEIVE A SHOT FOR BIRTH CONTROL, OR YOU HAVE AN IUD, please notify your provider to make sure it is safe with Mavyret.   DO NOT stop Mavyret unless instructed to by your provider. If you are hospitalized while taking this medication please bring it with you to the hospital to avoid interruption of therapy. Every pill is important!  The most common side effects associated with Mavyret include:   Fatigue Headache Nausea Diarrhea Insomnia

## 2024-08-06 NOTE — Progress Notes (Signed)
 Patient Name: Amy Walls  Date of Birth: [birth year]  MRN: [id number]  PCP: Almarie Waddell NOVAK, NP  Referring Provider: Almarie Waddell NOVAK, NP, Ph#: [phone]   Subjective   CC: New patient - initial evaluation and management of chronic hepatitis C infection.   Discussed the use of AI scribe software for clinical note transcription with the patient, who gave verbal consent to proceed.  History of Present Illness   Amy Walls is a 43 year old female with hepatitis C who presents for treatment consideration. She was referred by her primary care provider for treatment consideration of hepatitis C.  She has a history of hepatitis C, genotype 3, diagnosed in 2014, and has not attempted treatment since diagnosis. She was previously seen by an infectious disease specialist in 2020 but did not follow up. Recent lab work from November 21st showed a hepatitis C RNA viral load of 652,000. She is immune to hepatitis B with a positive hepatitis B antibody and a negative hepatitis B core antibody.  She experiences hair loss ongoing for about five years, sometimes occurring in clumps. Her menstrual cycle has changed. No cycle the last 3 months. Negative pregnancy test in November. She has used contraceptive implants for 20+ years and the current one is likely in need of exchange. In addition she has noticed that her skin is very dry and itchy, with occasional red splotchy rashes. No jaundice is present. No bleeding problems.   She has a history of opioid use disorder and is currently on Suboxone. She has abstained from intravenous drug use for over three years and does not consume alcohol or smoke. She experiences stomach discomfort and an occasional burning sensation in her back, but no vomiting. She also has headaches, which she associates with low sugar intake.   A recent CT scan to evaluate abdominal discomfort showed diffuse fatty infiltration of the liver, and her FIB4 score is 2.83. She weighs 116 pounds  with a BMI of 23.43. She has not had a diabetes screening in nine years. +Family history of diabetes.   She was previously on Paxil but has stopped it. She started Wellbutrin  but discontinued it due to nausea and sweating. She is not currently on any other medications.       Review of Systems  Constitutional:  Positive for malaise/fatigue and weight loss. Negative for chills and fever.  HENT:  Negative for tinnitus.   Eyes:  Negative for blurred vision and photophobia.  Respiratory:  Negative for cough and sputum production.   Cardiovascular:  Negative for chest pain and leg swelling.  Gastrointestinal:  Positive for abdominal pain. Negative for diarrhea, nausea and vomiting.  Genitourinary:  Negative for dysuria.  Skin:  Positive for itching. Negative for rash.       Hairloss   Neurological:  Negative for headaches.    Past Medical History:  Diagnosis Date   ADHD (attention deficit hyperactivity disorder)    Anxiety    Arthritis    inflammation of all my joints   Asthma    Bipolar 1 disorder, mixed (HCC)    Bowel obstruction (HCC) 2007   upper bowel   Chronic bronchitis    yearly   Chronic mid back pain    Complication of anesthesia 2007   lost me on the table when I had upper bowel obstruction   Constipation, chronic    Depression    Fibromyalgia    Hepatitis C    Heroin abuse (HCC)    Hypoglycemia  my sugar runs low alot   IV drug user    Kidney infection    often   Migraines 12/28/2011   @ least once/wk   PONV (postoperative nausea and vomiting)    PTSD (post-traumatic stress disorder)    Recurrent UTI     Outpatient Medications Prior to Visit  Medication Sig Dispense Refill   buprenorphine-naloxone (SUBOXONE) 8-2 mg SUBL SL tablet Place 1 tablet under the tongue 2 (two) times daily.     buPROPion  (WELLBUTRIN  XL) 150 MG 24 hr tablet Take 1 tablet (150 mg total) by mouth daily. 90 tablet 1   dicyclomine  (BENTYL ) 20 MG tablet Take 1 tablet (20  mg total) by mouth 3 (three) times daily as needed. 20 tablet 0   rizatriptan  (MAXALT ) 10 MG tablet Take 1 tablet (10 mg total) by mouth as needed for migraine. May repeat in 2 hours if needed 10 tablet 0   No facility-administered medications prior to visit.     Allergies[1]  Social History[2]  Family History  Problem Relation Age of Onset   Diabetes Mother    Heart disease Father    Breast cancer Maternal Grandmother    Colon cancer Maternal Grandmother    Irritable bowel syndrome Maternal Grandmother    Lung cancer Maternal Grandmother    Vision loss Maternal Grandfather    Cancer Maternal Aunt        colon cancer          Objective   Today's Vitals   08/06/24 1354  BP: 120/76  Pulse: 73  Temp: 98.9 F (37.2 C)  TempSrc: Oral  SpO2: 94%  Weight: 116 lb (52.6 kg)  PainSc: 0-No pain   Body mass index is 23.43 kg/m.  Constitutional: in no apparent distress, well developed and well nourished, and oriented times 3 Eyes: anicteric Cardiovascular: Cor RRR Respiratory: clear Gastrointestinal: non-obese Musculoskeletal: normal gait Skin: negative for - jaundice, spider hemangioma, telangiectasia, palmar erythema, ecchymosis and atrophy; no porphyria cutanea tarda Lymphatic: no cervical lymphadenopathy     Assessment & Plan:  Assessment and Plan    Chronic hepatitis C infection - Genotype 3, diagnosed in 2014, untreated. Current viral load is 652,000. Liver function tests show elevated AST (152) and ALT (100), with normal albumin and platelet count. FIB-4 score is 2.83, indeterminate. No evidence of cirrhosis on recent CT scan, but has some fatty liver infiltration described. No alcohol or intravenous drug use for over three years. Protected against hepatitis B with positive antibody. - Update Genotype, INR and  - Requested insurance approval for Mavyret (preferred with h/o GT3) or Epclusa for hepatitis C treatment. - Scheduled liver elastography to assess liver  fibrosis.  - FU in 6 weeks  - Will plan for cure test 3 months after completion of treatment.  Fatty liver disease - Diffuse fatty infiltration of the liver noted on recent CT scan. No evidence of cirrhosis. She is of normal BMI. Diabetes screening today. May be more indicative of inflammation r/t viral infection. Would benefit from repeat after eradication of infection. She has hepatology visit scheduled in March 2026.  - Ordered diabetes screening. - Scheduled liver ultrasound to assess liver fibrosis.- repeat after treatment  - Monitor liver function tests and symptoms.       Orders Placed This Encounter  Procedures   US  ABDOMEN RUQ W/ELASTOGRAPHY    Standing Status:   Future    Expected Date:   08/09/2024    Expiration Date:   02/04/2025  Reason for Exam (SYMPTOM  OR DIAGNOSIS REQUIRED):   chronic hepatitis C infection    Preferred imaging location?:   [place]   Hepatitis C genotype   Liver Fibrosis, FibroTest-ActiTest   Hemoglobin A1c   Protime-INR    No orders of the defined types were placed in this encounter.   Return in about 6 weeks (around 09/17/2024).  Corean Fireman, MSN, NP-C [facility] Disease [facility]  [email or web address] Pager: [phone] Office: [phone] [facility] Main Line: [phone] *Secure Chat Communication Welcome      [1]  Allergies Allergen Reactions   Cinnamon Anaphylaxis    swells up airways   Levaquin [Levofloxacin] Other (See Comments)    burns my veins when they put it thru the IV; like veins are on fire   Morphine  And Codeine Nausea And Vomiting   Penicillins Anaphylaxis    Has patient had a PCN reaction causing immediate rash, facial/tongue/throat swelling, SOB or lightheadedness with hypotension: Yes Has patient had a PCN reaction causing severe rash involving mucus membranes or skin necrosis: No Has patient had a PCN reaction that required hospitalization No Has patient  had a PCN reaction occurring within the last 10 years: Yes If all of the above answers are NO, then may proceed with Cephalosporin use.  swells up my airways   Acetaminophen  Other (See Comments)    Liver issues  [2]  Social History Tobacco Use   Smoking status: Former    Current packs/day: 0.00    Average packs/day: 0.5 packs/day for 20.0 years (10.0 ttl pk-yrs)    Types: Cigarettes, E-cigarettes    Start date: 2003    Quit date: 2023    Years since quitting: 2.9   Smokeless tobacco: Never  Vaping Use   Vaping status: Every Day  Substance Use Topics   Alcohol use: Not Currently   Drug use: Not Currently    Types: Cocaine, Heroin, Other-see comments    Comment: quit 2022

## 2024-08-13 ENCOUNTER — Ambulatory Visit: Payer: Self-pay | Admitting: Infectious Diseases

## 2024-08-14 ENCOUNTER — Ambulatory Visit (HOSPITAL_COMMUNITY)
Admission: RE | Admit: 2024-08-14 | Discharge: 2024-08-14 | Disposition: A | Discharge status: Home / Self Care | Payer: MEDICAID | Source: Ambulatory Visit | Attending: Infectious Diseases | Admitting: Infectious Diseases

## 2024-08-14 DIAGNOSIS — B182 Chronic viral hepatitis C: Secondary | ICD-10-CM | POA: Diagnosis present

## 2024-08-17 LAB — HEMOGLOBIN A1C
Hgb A1c MFr Bld: 5.1 %
Mean Plasma Glucose: 100 mg/dL
eAG (mmol/L): 5.5 mmol/L

## 2024-08-17 LAB — LIVER FIBROSIS, FIBROTEST-ACTITEST
ALT: 74 U/L — ABNORMAL HIGH (ref 6–29)
Alpha-2-Macroglobulin: 246 mg/dL (ref 106–279)
Apolipoprotein A1: 211 mg/dL — ABNORMAL HIGH (ref 101–198)
Bilirubin: 0.4 mg/dL (ref 0.2–1.2)
Fibrosis Score: 0.13
GGT: 42 U/L (ref 3–55)
Haptoglobin: 81 mg/dL (ref 43–212)
Necroinflammat ACT Score: 0.38
Reference ID: 5858514

## 2024-08-17 LAB — PROTIME-INR
INR: 1
Prothrombin Time: 10.7 s (ref 9.0–11.5)

## 2024-08-17 LAB — HEPATITIS C GENOTYPE: HCV Genotype: 3

## 2024-08-21 ENCOUNTER — Other Ambulatory Visit (HOSPITAL_COMMUNITY): Payer: Self-pay

## 2024-08-21 ENCOUNTER — Other Ambulatory Visit (HOSPITAL_BASED_OUTPATIENT_CLINIC_OR_DEPARTMENT_OTHER): Payer: Self-pay

## 2024-08-21 MED ORDER — NORETHINDRONE 0.35 MG PO TABS
1.0000 | ORAL_TABLET | Freq: Every day | ORAL | 3 refills | Status: AC
  Filled 2024-08-21: qty 84, 84d supply, fill #0

## 2024-08-22 NOTE — Progress Notes (Signed)
 Forwarding to you since I opened first. I suppose probably best to wait until Friday since it'll be in the new year. Thank you!

## 2024-08-24 ENCOUNTER — Telehealth: Payer: Self-pay | Admitting: Neurology

## 2024-08-24 NOTE — Telephone Encounter (Signed)
 Patient called wanting to see about her psychiatric referral. It looks like it was placed on 07/13/2024 but no notes after that. Please advise?   Copied from CRM #[id number]. Topic: Referral - Status >> Aug 24, 2024  3:25 PM Alfonso HERO wrote: Calling for the status of some referrals she requested asking for a call back

## 2024-08-27 ENCOUNTER — Other Ambulatory Visit (HOSPITAL_COMMUNITY): Payer: Self-pay

## 2024-08-28 ENCOUNTER — Encounter (HOSPITAL_BASED_OUTPATIENT_CLINIC_OR_DEPARTMENT_OTHER): Payer: Self-pay

## 2024-08-28 ENCOUNTER — Other Ambulatory Visit: Payer: Self-pay

## 2024-08-28 ENCOUNTER — Other Ambulatory Visit (HOSPITAL_COMMUNITY): Payer: Self-pay

## 2024-08-28 ENCOUNTER — Ambulatory Visit (HOSPITAL_BASED_OUTPATIENT_CLINIC_OR_DEPARTMENT_OTHER)
Admission: RE | Admit: 2024-08-28 | Discharge: 2024-08-28 | Disposition: A | Discharge status: Home / Self Care | Payer: MEDICAID | Source: Ambulatory Visit | Attending: Family Medicine | Admitting: Family Medicine

## 2024-08-28 ENCOUNTER — Telehealth: Payer: Self-pay | Admitting: Pharmacist

## 2024-08-28 ENCOUNTER — Other Ambulatory Visit: Payer: Self-pay | Admitting: Pharmacist

## 2024-08-28 DIAGNOSIS — Z1231 Encounter for screening mammogram for malignant neoplasm of breast: Secondary | ICD-10-CM | POA: Diagnosis present

## 2024-08-28 DIAGNOSIS — B182 Chronic viral hepatitis C: Secondary | ICD-10-CM

## 2024-08-28 MED ORDER — MAVYRET 100-40 MG PO TABS
3.0000 | ORAL_TABLET | Freq: Every day | ORAL | 1 refills | Status: AC
  Filled 2024-08-28: qty 84, 28d supply, fill #0
  Filled 2024-09-19: qty 84, 28d supply, fill #1

## 2024-08-28 NOTE — Telephone Encounter (Signed)
 Patient is approved to receive Mavyret  x 8 weeks for chronic Hepatitis C infection. Counseled patient to take all three tablets of Mavyret  daily with food.  Counseled patient the need to take all three tablets together and to not separate them out during the day. Encouraged patient not to miss any doses and explained how their chance of cure could go down with each dose missed. Counseled patient on what to do if dose is missed - if it is closer to the missed dose take immediately; if closer to next dose then skip dose and take the next dose at the usual time.   Counseled patient on common side effects such as headache, fatigue, and nausea and that these normally decrease with time. I reviewed patient medications and found no drug interactions. Discussed with patient that there are several drug interactions with Mavyret  and instructed patient to call the clinic if she wishes to start a new medication during course of therapy. Also advised patient to call if she experiences any side effects. Patient will follow-up with Corean on 10/02/24.  Vannesa Abair L. Arlesia Kiel, PharmD, BCIDP, AAHIVP, CPP Infectious Diseases Clinical Pharmacist Practitioner Clinical Pharmacist Lead, Specialty Pharmacy [facility] for Infectious Disease

## 2024-08-28 NOTE — Progress Notes (Signed)
 Specialty Pharmacy Initiation Note   Laiza Veenstra is a 44 y.o. female who will be followed by the specialty pharmacy service for RxSp Hepatitis C    Review of administration, indication, effectiveness, safety, potential side effects, storage/disposable, and missed dose instructions occurred today for patient's specialty medication(s) Glecaprevir -Pibrentasvir  (Mavyret )     Patient/Caregiver did not have any additional questions or concerns.   Patient's therapy is appropriate to: Initiate    Goals Addressed             This Visit's Progress    Achieve virologic cure as evidenced by SVR       Patient is initiating therapy. Patient will maintain adherence.      Comply with lab assessments       Patient is initiating therapy. Patient will adhere to provider and/or lab appointments.      Maintain optimal adherence to therapy       Patient is initiating therapy. Patient will work on increased adherence.         Please see counseling telephone note in epic from today.  Grey Schlauch L. Cranston Koors, PharmD, BCIDP, AAHIVP, CPP Infectious Diseases Clinical Pharmacist Practitioner Clinical Pharmacist Lead, Specialty Pharmacy [facility] for [facility]ious Disease

## 2024-08-28 NOTE — Telephone Encounter (Signed)
 Patient aware. Scheduled on 10/02/2024.   Amy Walls [name], CMA

## 2024-08-28 NOTE — Progress Notes (Signed)
 Ok - I sent Mavyret  to [facility]. Let me know whenever you speak with her, and we will counsel her :)

## 2024-08-28 NOTE — Telephone Encounter (Signed)
-----   Message from Corean Fireman, NP sent at 08/28/2024 10:16 AM EST ----- Please call Amy Walls to let her know that her liver ultrasound results are in -   NO concerns for any cirrhosis (permanent scarring) and no concerns for any masses.  All good news.  There was some mild stiffness (fibrosis) that should reverse after treatment. We will plan to repeat this for you again after you complete treatment at the cure check 3 months after you finish your  last dose of medication   Looks like your medication is in the works and will get you started soon! Would like to get her scheduled for a follow up in 4-6 weeks with me to check in after she gets started.

## 2024-08-28 NOTE — Progress Notes (Signed)
 Specialty Pharmacy Initial Fill Coordination Note  Amy Walls is a 44 y.o. female contacted today regarding initial fill of specialty medication(s) Glecaprevir -Pibrentasvir  (Mavyret )   Patient requested [name] at [facility] at [place] date: 08/30/24   Medication will be filled on: 08/28/24    Patient is aware of $4 copayment.

## 2024-08-29 ENCOUNTER — Other Ambulatory Visit (HOSPITAL_COMMUNITY): Payer: Self-pay

## 2024-08-29 ENCOUNTER — Other Ambulatory Visit: Payer: Self-pay

## 2024-08-30 ENCOUNTER — Telehealth: Payer: Self-pay | Admitting: Family Medicine

## 2024-08-30 ENCOUNTER — Other Ambulatory Visit (HOSPITAL_COMMUNITY): Payer: Self-pay

## 2024-08-30 NOTE — Telephone Encounter (Signed)
 Copied from CRM [id number]. Topic: Referral - Request for Referral >> Aug 30, 2024 10:54 AM Alfonso ORN wrote: Did the patient discuss referral with their provider in the last year? Yes   Appointment offered? Yes  Type of order/referral and detailed reason for visit: Psychologist   Preference of office, provider, location: [facility] location   If referral order, have you been seen by this specialty before? Yes 2021   Can we respond through MyChart? No, pt req phone call

## 2024-08-30 NOTE — Telephone Encounter (Signed)
 See previous phone note, it appears patient has still not been contacted about appointment?

## 2024-09-03 ENCOUNTER — Telehealth: Payer: Self-pay | Admitting: Pharmacist

## 2024-09-04 ENCOUNTER — Other Ambulatory Visit (HOSPITAL_COMMUNITY): Payer: Self-pay

## 2024-09-04 NOTE — Telephone Encounter (Signed)
 Patient called and stated that two days after she started Mavyret , she developed full body itching with a red rash. States this has never happened before and it is constant. She is asking if she can try benadryl . She has not started any new detergents, lotions, shampoos, or body washes. No shortness of breath or trouble breathing. Advised her to try benadryl  for a few days to see if it helps. If not, will discuss with Corean on next steps to see if an alternative may be an option. She will call back if she is still having issues in a few days.   Matylda Fehring L. Randie Tallarico, PharmD, BCIDP, AAHIVP, CPP Infectious Diseases Clinical Pharmacist Practitioner Clinical Pharmacist Lead, Specialty Pharmacy [facility] for Infectious Disease

## 2024-09-04 NOTE — Telephone Encounter (Signed)
 I spoke with Daejah and she was pretty confident she figured out what the rash symptom was from after she investigated and feels more confident it's not related to the hep c medication.  She has conitnued the mavyret  and has use some of the benadryl . We talked about signs to stop for sure w/o question and i asked her to call us  wiht an update next week which she said she will do.   Will continue the mavyret  for now with caution.   D/W Cassie as well to update her.    Corean Fireman, MSN, NP-C [facility] for Infectious Disease [facility]  [email or web address] Pager: [phone] Office: [phone] [facility] Main Line: [phone] *Secure Chat Communication Welcome

## 2024-09-05 ENCOUNTER — Other Ambulatory Visit (HOSPITAL_BASED_OUTPATIENT_CLINIC_OR_DEPARTMENT_OTHER): Payer: Self-pay

## 2024-09-19 ENCOUNTER — Other Ambulatory Visit: Payer: Self-pay

## 2024-09-19 NOTE — Progress Notes (Signed)
 Specialty Pharmacy Refill Coordination Note  Amy Walls is a 44 y.o. female contacted today regarding refills of specialty medication(s) Glecaprevir -Pibrentasvir  (Mavyret )   Patient requested Pickup at [facility] at [place] date: 09/21/24   Medication will be filled on: 09/19/24

## 2024-09-20 ENCOUNTER — Other Ambulatory Visit: Payer: Self-pay

## 2024-10-02 ENCOUNTER — Ambulatory Visit: Payer: Self-pay | Admitting: Infectious Diseases

## 2024-10-15 ENCOUNTER — Ambulatory Visit: Admitting: Family Medicine
